# Patient Record
Sex: Male | Born: 1965 | Race: White | Hispanic: No | Marital: Married | Smoking: Former smoker
Health system: Southern US, Community
[De-identification: ages and names within clinical notes are randomized; demographics above are authoritative.]

## PROBLEM LIST (undated history)

## (undated) DIAGNOSIS — M545 Low back pain: Secondary | ICD-10-CM

## (undated) DIAGNOSIS — K219 Gastro-esophageal reflux disease without esophagitis: Secondary | ICD-10-CM

## (undated) DIAGNOSIS — F329 Major depressive disorder, single episode, unspecified: Secondary | ICD-10-CM

## (undated) DIAGNOSIS — E119 Type 2 diabetes mellitus without complications: Secondary | ICD-10-CM

## (undated) DIAGNOSIS — E785 Hyperlipidemia, unspecified: Secondary | ICD-10-CM

## (undated) DIAGNOSIS — I10 Essential (primary) hypertension: Secondary | ICD-10-CM

## (undated) DIAGNOSIS — F32A Depression, unspecified: Secondary | ICD-10-CM

## (undated) HISTORY — DX: Low back pain: M54.5

## (undated) HISTORY — PX: TONSILLECTOMY: SUR1361

## (undated) HISTORY — DX: Hyperlipidemia, unspecified: E78.5

## (undated) HISTORY — PX: WISDOM TOOTH EXTRACTION: SHX21

## (undated) HISTORY — PX: OTHER SURGICAL HISTORY: SHX169

---

## 2002-06-09 DIAGNOSIS — M545 Low back pain, unspecified: Secondary | ICD-10-CM

## 2002-06-09 HISTORY — DX: Low back pain, unspecified: M54.50

## 2005-11-05 ENCOUNTER — Emergency Department: Payer: Self-pay | Admitting: Emergency Medicine

## 2006-01-05 ENCOUNTER — Emergency Department: Payer: Self-pay | Admitting: Emergency Medicine

## 2009-04-30 ENCOUNTER — Ambulatory Visit: Payer: Self-pay | Admitting: Internal Medicine

## 2011-09-08 ENCOUNTER — Emergency Department: Payer: Self-pay | Admitting: Emergency Medicine

## 2011-09-08 LAB — BASIC METABOLIC PANEL
Anion Gap: 11 (ref 7–16)
BUN: 15 mg/dL (ref 7–18)
Calcium, Total: 8.9 mg/dL (ref 8.5–10.1)
Chloride: 102 mmol/L (ref 98–107)
Co2: 25 mmol/L (ref 21–32)
Creatinine: 0.91 mg/dL (ref 0.60–1.30)
EGFR (African American): >60
EGFR (Non-African Amer.): >60
Glucose: 157 mg/dL — ABNORMAL HIGH (ref 65–99)
Osmolality: 280 (ref 275–301)
Potassium: 3.8 mmol/L (ref 3.5–5.1)
Sodium: 138 mmol/L (ref 136–145)

## 2011-09-08 LAB — CBC
HCT: 47.1 % (ref 40.0–52.0)
HGB: 16 g/dL (ref 13.0–18.0)
MCH: 31.1 pg (ref 26.0–34.0)
MCHC: 33.9 g/dL (ref 32.0–36.0)
MCV: 92 fL (ref 80–100)
Platelet: 193 10*3/uL (ref 150–440)
RBC: 5.13 10*6/uL (ref 4.40–5.90)
RDW: 11.4 % — ABNORMAL LOW (ref 11.5–14.5)
WBC: 7.9 10*3/uL (ref 3.8–10.6)

## 2011-09-08 LAB — TROPONIN I: Troponin-I: <0.02 ng/mL

## 2012-08-02 ENCOUNTER — Emergency Department: Payer: Self-pay | Admitting: Emergency Medicine

## 2013-12-26 ENCOUNTER — Ambulatory Visit: Payer: Self-pay

## 2014-11-21 ENCOUNTER — Other Ambulatory Visit: Payer: Self-pay

## 2014-11-21 DIAGNOSIS — K219 Gastro-esophageal reflux disease without esophagitis: Secondary | ICD-10-CM

## 2014-11-21 LAB — CBC
Hematocrit: 43.6 % (ref 37.5–51.0)
Hemoglobin: 15.8 g/dL (ref 12.6–17.7)
MCH: 32.4 pg (ref 26.6–33.0)
MCHC: 36.2 g/dL — ABNORMAL HIGH (ref 31.5–35.7)
MCV: 90 fL (ref 79–97)
Platelets: 198 10*3/uL (ref 150–379)
RBC: 4.87 x10E6/uL (ref 4.14–5.80)
RDW: 12 % — ABNORMAL LOW (ref 12.3–15.4)
WBC: 7 10*3/uL (ref 3.4–10.8)

## 2014-11-22 ENCOUNTER — Telehealth: Payer: Self-pay | Admitting: Family Medicine

## 2014-11-22 LAB — COMPREHENSIVE METABOLIC PANEL
ALT: 76 IU/L — ABNORMAL HIGH (ref 0–44)
AST: 55 IU/L — ABNORMAL HIGH (ref 0–40)
Albumin/Globulin Ratio: 1.5 (ref 1.1–2.5)
Albumin: 4.3 g/dL (ref 3.5–5.5)
Alkaline Phosphatase: 90 IU/L (ref 39–117)
BUN/Creatinine Ratio: 11 (ref 9–20)
BUN: 12 mg/dL (ref 6–24)
Bilirubin Total: 1.1 mg/dL (ref 0.0–1.2)
CO2: 23 mmol/L (ref 18–29)
Calcium: 9.7 mg/dL (ref 8.7–10.2)
Chloride: 100 mmol/L (ref 97–108)
Creatinine, Ser: 1.11 mg/dL (ref 0.76–1.27)
GFR calc Af Amer: 90 mL/min/{1.73_m2} (ref >59)
GFR calc non Af Amer: 78 mL/min/{1.73_m2} (ref >59)
Globulin, Total: 2.8 g/dL (ref 1.5–4.5)
Glucose: 166 mg/dL — ABNORMAL HIGH (ref 65–99)
Potassium: 4.1 mmol/L (ref 3.5–5.2)
Sodium: 140 mmol/L (ref 134–144)
Total Protein: 7.1 g/dL (ref 6.0–8.5)

## 2014-11-22 NOTE — Progress Notes (Signed)
LMTCB/ JH  

## 2014-11-22 NOTE — Telephone Encounter (Signed)
Called pt to review labs. No answer.  If he calls while I am out of the office: CMP: Glucose elevated- he will need a HgA1c at next visit. Liver enzymes are also elevated. Please review any medication use such as tylenol that can elevate liver enzymes. Also triage how much alcohol the patient is consuming. Caution against overuse of tylenol and limit alcohol intake. He will need these enzymes checked in about 1 month. He's due for a follow-up for his anxiety soon.   Thanks! AK

## 2014-11-22 NOTE — Telephone Encounter (Signed)
Left Message with male to have him call us.[facility]

## 2014-11-22 NOTE — Progress Notes (Signed)
Quick Note:  Glucose is elevated. Likely not fasting. We will check him for diabetes with an A1c at his next visit. Liver enzymes are elevated- review alcohol intake and tylenol use with patient. Recheck. ______

## 2014-11-23 ENCOUNTER — Telehealth: Payer: Self-pay | Admitting: Family Medicine

## 2014-11-23 NOTE — Telephone Encounter (Signed)
Have left several messages. Will send letter and await call from patient.[facility]

## 2014-11-23 NOTE — Telephone Encounter (Signed)
Spoke to pt and advised him to reduce alcohol intake and no tylenol and needed an appt within month for follow up reg lab work and anxiety he will call us to schedule an appt.

## 2014-11-23 NOTE — Telephone Encounter (Signed)
Pt. Return your  Call from yesterday. Pt call back # is [phone]. thanks

## 2015-01-15 ENCOUNTER — Ambulatory Visit (INDEPENDENT_AMBULATORY_CARE_PROVIDER_SITE_OTHER): Payer: No Typology Code available for payment source | Admitting: Family Medicine

## 2015-01-15 ENCOUNTER — Encounter: Payer: Self-pay | Admitting: Family Medicine

## 2015-01-15 VITALS — BP 154/96 | HR 70 | Temp 97.8°F | Resp 16 | Ht 69.0 in | Wt 261.0 lb

## 2015-01-15 DIAGNOSIS — S338XXA Sprain of other parts of lumbar spine and pelvis, initial encounter: Secondary | ICD-10-CM

## 2015-01-15 DIAGNOSIS — M549 Dorsalgia, unspecified: Secondary | ICD-10-CM | POA: Diagnosis not present

## 2015-01-15 DIAGNOSIS — K219 Gastro-esophageal reflux disease without esophagitis: Secondary | ICD-10-CM | POA: Insufficient documentation

## 2015-01-15 DIAGNOSIS — R7309 Other abnormal glucose: Secondary | ICD-10-CM | POA: Diagnosis not present

## 2015-01-15 DIAGNOSIS — R7401 Elevation of levels of liver transaminase levels: Secondary | ICD-10-CM

## 2015-01-15 DIAGNOSIS — S39012A Strain of muscle, fascia and tendon of lower back, initial encounter: Secondary | ICD-10-CM

## 2015-01-15 DIAGNOSIS — G8929 Other chronic pain: Secondary | ICD-10-CM | POA: Diagnosis not present

## 2015-01-15 DIAGNOSIS — G4700 Insomnia, unspecified: Secondary | ICD-10-CM | POA: Insufficient documentation

## 2015-01-15 DIAGNOSIS — IMO0001 Reserved for inherently not codable concepts without codable children: Secondary | ICD-10-CM

## 2015-01-15 DIAGNOSIS — R740 Nonspecific elevation of levels of transaminase and lactic acid dehydrogenase [LDH]: Secondary | ICD-10-CM | POA: Diagnosis not present

## 2015-01-15 DIAGNOSIS — R030 Elevated blood-pressure reading, without diagnosis of hypertension: Secondary | ICD-10-CM

## 2015-01-15 DIAGNOSIS — E669 Obesity, unspecified: Secondary | ICD-10-CM | POA: Insufficient documentation

## 2015-01-15 MED ORDER — MELOXICAM 15 MG PO TABS: 15.0000 mg | ORAL_TABLET | Freq: Every day | ORAL | Status: DC

## 2015-01-15 MED ORDER — KETOROLAC TROMETHAMINE 30 MG/ML IJ SOLN
15.0000 mg | Freq: Once | INTRAMUSCULAR | Status: AC
  Administered 2015-01-15: 15 mg via INTRAMUSCULAR

## 2015-01-15 MED ORDER — METHOCARBAMOL 500 MG PO TABS: 1000.0000 mg | ORAL_TABLET | Freq: Four times a day (QID) | ORAL | Status: DC

## 2015-01-15 NOTE — Progress Notes (Signed)
Subjective:    Patient ID: Dean Clements, male    DOB: [birth year], 49 y.o.   MRN: [id number]  HPI: Drago Hammonds is a 49 y.o. male presenting on 01/15/2015 for Back Pain   Back Pain This is a chronic problem. The current episode started more than 1 year ago. The problem has been waxing and waning since onset. The pain is present in the lumbar spine. The quality of the pain is described as stabbing. The pain does not radiate. The pain is at a severity of 10/10. The pain is the same all the time. The symptoms are aggravated by twisting. Pertinent negatives include no bladder incontinence, bowel incontinence, chest pain, dysuria, fever, headaches, leg pain, numbness, paresthesias, pelvic pain, perianal numbness or weakness. Risk factors include obesity. He has tried ice and NSAIDs for the symptoms. The treatment provided mild relief.    Pt presents for low back pain. Symptoms back on last Thursday. Just switched jobs, does a lot more moving/bending at work. This is a frequent complaint due to old back injury. Has a back flare about once per year.   Pt also had questions about recent lab work:  Transaminases are elevated- recheck hepatic function. Reports he was drinking more when labs were done.  Pt reports reducing his alcohol intake.  Elevated glucose: We will check HgA1c to screen for diabetes.  BP is elevated today. Pt has no history of elevated BP. Pt reports he is in a lot of pain.    Past Medical History  Diagnosis Date  . Hyperlipidemia   . Low back pain 2004    occasional back flares each year.     No current outpatient prescriptions on file prior to visit.   No current facility-administered medications on file prior to visit.    Review of Systems  Constitutional: Negative for fever and chills.  HENT: Negative.   Respiratory: Negative for chest tightness, shortness of breath and wheezing.   Cardiovascular: Negative for chest pain, palpitations and leg  swelling.  Gastrointestinal: Negative.  Negative for bowel incontinence.  Genitourinary: Negative for bladder incontinence, dysuria, urgency, difficulty urinating and pelvic pain.  Musculoskeletal: Positive for back pain.  Neurological: Negative for weakness, numbness, headaches and paresthesias.   Per HPI unless specifically indicated above     Objective:    BP 154/96 mmHg  Pulse 70  Temp(Src) 97.8 F (36.6 C) (Oral)  Resp 16  Ht 5\' 9"  (1.753 m)  Wt 261 lb (118.389 kg)  BMI 38.53 kg/m2  Wt Readings from Last 3 Encounters:  01/15/15 261 lb (118.389 kg)    Physical Exam  Constitutional: He is oriented to person, place, and time. He appears well-developed and well-nourished. No distress.  Cardiovascular: Normal rate and regular rhythm.  Exam reveals no gallop and no friction rub.   No murmur heard. Pulmonary/Chest: Effort normal. He has no wheezes. He has no rales.  Musculoskeletal:       Lumbar back: He exhibits pain (Focal tenderness to the R of the lumbar spine. ). He exhibits no swelling, no edema and no deformity.  Pain elicited with twisting to R. Pt able to bend to toes without pain.   Straight leg negative.   Neurological: He is alert and oriented to person, place, and time. He has normal strength and normal reflexes. No cranial nerve deficit or sensory deficit.  Skin: He is not diaphoretic.   Results for orders placed or performed in visit on 11/21/14  Comprehensive Metabolic Panel (  CMET)  Result Value Ref Range   Glucose 166 (H) 65 - 99 mg/dL   BUN 12 6 - 24 mg/dL   Creatinine, Ser 1.61 0.76 - 1.27 mg/dL   GFR calc non Af Amer 78 >59 mL/min/1.73   GFR calc Af Amer 90 >59 mL/min/1.73   BUN/Creatinine Ratio 11 9 - 20   Sodium 140 134 - 144 mmol/L   Potassium 4.1 3.5 - 5.2 mmol/L   Chloride 100 97 - 108 mmol/L   CO2 23 18 - 29 mmol/L   Calcium 9.7 8.7 - 10.2 mg/dL   Total Protein 7.1 6.0 - 8.5 g/dL   Albumin 4.3 3.5 - 5.5 g/dL   Globulin, Total 2.8 1.5 - 4.5  g/dL   Albumin/Globulin Ratio 1.5 1.1 - 2.5   Bilirubin Total 1.1 0.0 - 1.2 mg/dL   Alkaline Phosphatase 90 39 - 117 IU/L   AST 55 (H) 0 - 40 IU/L   ALT 76 (H) 0 - 44 IU/L  CBC  Result Value Ref Range   WBC 7.0 3.4 - 10.8 x10E3/uL   RBC 4.87 4.14 - 5.80 x10E6/uL   Hemoglobin 15.8 12.6 - 17.7 g/dL   Hematocrit 09.6 04.5 - 51.0 %   MCV 90 79 - 97 fL   MCH 32.4 26.6 - 33.0 pg   MCHC 36.2 (H) 31.5 - 35.7 g/dL   RDW 40.9 (L) 81.1 - 91.4 %   Platelets 198 150 - 379 x10E3/uL      Assessment & Plan:   Problem List Items Addressed This Visit      Other   Chronic back pain - Primary    Toradol injection given. Mobic at home starting tomorrow. PRN Robaxin.  Discussed alarm symptoms. Encouraged stretching, maintaining healthy weight, core strengthening exercise.       Relevant Medications   methocarbamol (ROBAXIN) 500 MG tablet   meloxicam (MOBIC) 15 MG tablet   ketorolac (TORADOL) 30 MG/ML injection 15 mg   Severe obesity (BMI 35.0-39.9)    Discussed role extra weight can play in back pain. Encouraged exercise and maintaining healthy weight.        Other Visit Diagnoses    Elevated transaminase level        Pt reports decrease ETOH intake. Recheck hepatic function panel.     Relevant Orders    Hepatic function panel    Elevated random blood glucose level        Check A1c due to random glucose >140    Relevant Orders    HgB A1c    Elevated BP        Likely due to pain. Will recheck at next visit.        Meds ordered this encounter  Medications  . pantoprazole (PROTONIX) 40 MG tablet    Sig: Take 40 mg by mouth daily.  Marland Kitchen FLUoxetine (PROZAC) 20 MG capsule    Sig: Take 40 mg by mouth daily.  . methocarbamol (ROBAXIN) 500 MG tablet    Sig: Take 2 tablets (1,000 mg total) by mouth 4 (four) times daily.    Dispense:  24 tablet    Refill:  0    Order Specific Question:  Supervising Provider    Answer:  Janeann Forehand [phone]  . meloxicam (MOBIC) 15 MG tablet    Sig:  Take 1 tablet (15 mg total) by mouth daily.    Dispense:  14 tablet    Refill:  0    Order Specific Question:  Supervising Provider    Answer:  Janeann Forehand [[id number]]  . ketorolac (TORADOL) 30 MG/ML injection 15 mg    Sig:       Follow up plan: Return if symptoms worsen or fail to improve.

## 2015-01-15 NOTE — Patient Instructions (Addendum)
Back pain: take Robaxin up to 4 times daily as needed for pain. This medication will make you sleepy- please be careful driving. Take mobic once daily- no other Ibuprofen, aleve, or advil while taking.   If you develop progressive numbness, weakness, loss of feeling in your pelvis, or loss of bowel/bladder control please seek immediate medical attention.    Maintaining an ideal body habitus, regular exercise, proper lifting techniques and mindfulness of exacerbating factors will be useful in long term management.  Instructed on use of heating pad with exercises. Consider concomitant therapy with PT, massage therapist or chiropractor. May use anti-inflammatory medication and muscle relaxer as needed.   Back Pain, Adult Low back pain is very common. About 1 in 5 people have back pain.The cause of low back pain is rarely dangerous. The pain often gets better over time.About half of people with a sudden onset of back pain feel better in just 2 weeks. About 8 in 10 people feel better by 6 weeks.  CAUSES Some common causes of back pain include:  Strain of the muscles or ligaments supporting the spine.  Wear and tear (degeneration) of the spinal discs.  Arthritis.  Direct injury to the back. DIAGNOSIS Most of the time, the direct cause of low back pain is not known.However, back pain can be treated effectively even when the exact cause of the pain is unknown.Answering your caregiver's questions about your overall health and symptoms is one of the most accurate ways to make sure the cause of your pain is not dangerous. If your caregiver needs more information, he or she may order lab work or imaging tests (X-rays or MRIs).However, even if imaging tests show changes in your back, this usually does not require surgery. HOME CARE INSTRUCTIONS For many people, back pain returns.Since low back pain is rarely dangerous, it is often a condition that people can learn to [facility] their own.   Remain  active. It is stressful on the back to sit or stand in one place. Do not sit, drive, or stand in one place for more than 30 minutes at a time. Take short walks on level surfaces as soon as pain allows.Try to increase the length of time you walk each day.  Do not stay in bed.Resting more than 1 or 2 days can delay your recovery.  Do not avoid exercise or work.Your body is made to move.It is not dangerous to be active, even though your back may hurt.Your back will likely heal faster if you return to being active before your pain is gone.  Pay attention to your body when you bend and lift. Many people have less discomfortwhen lifting if they bend their knees, keep the load close to their bodies,and avoid twisting. Often, the most comfortable positions are those that put less stress on your recovering back.  Find a comfortable position to sleep. Use a firm mattress and lie on your side with your knees slightly bent. If you lie on your back, put a pillow under your knees.  Only take over-the-counter or prescription medicines as directed by your caregiver. Over-the-counter medicines to reduce pain and inflammation are often the most helpful.Your caregiver may prescribe muscle relaxant drugs.These medicines help dull your pain so you can more quickly return to your normal activities and healthy exercise.  Put ice on the injured area.  Put ice in a plastic bag.  Place a towel between your skin and the bag.  Leave the ice on for 15-20 minutes, 03-04 times  a day for the first 2 to 3 days. After that, ice and heat may be alternated to reduce pain and spasms.  Ask your caregiver about trying back exercises and gentle massage. This may be of some benefit.  Avoid feeling anxious or stressed.Stress increases muscle tension and can worsen back pain.It is important to recognize when you are anxious or stressed and learn ways to manage it.Exercise is a great option. SEEK MEDICAL CARE IF:  You  have pain that is not relieved with rest or medicine.  You have pain that does not improve in 1 week.  You have new symptoms.  You are generally not feeling well. SEEK IMMEDIATE MEDICAL CARE IF:   You have pain that radiates from your back into your legs.  You develop new bowel or bladder control problems.  You have unusual weakness or numbness in your arms or legs.  You develop nausea or vomiting.  You develop abdominal pain.  You feel faint. Document Released: 05/26/2005 Document Revised: 11/25/2011 Document Reviewed: 09/27/2013 [facility] Patient Information 2015 [place], [place]. This information is not intended to replace advice given to you by your health care provider. Make sure you discuss any questions you have with your health care provider.

## 2015-01-15 NOTE — Assessment & Plan Note (Signed)
Toradol injection given. Mobic at home starting tomorrow. PRN Robaxin.  Discussed alarm symptoms. Encouraged stretching, maintaining healthy weight, core strengthening exercise.

## 2015-01-15 NOTE — Assessment & Plan Note (Signed)
Discussed role extra weight can play in back pain. Encouraged exercise and maintaining healthy weight.

## 2015-01-30 ENCOUNTER — Telehealth: Payer: Self-pay | Admitting: *Deleted

## 2015-01-30 ENCOUNTER — Telehealth: Payer: Self-pay

## 2015-01-30 DIAGNOSIS — S39012A Strain of muscle, fascia and tendon of lower back, initial encounter: Secondary | ICD-10-CM

## 2015-01-30 MED ORDER — METHOCARBAMOL 500 MG PO TABS: 1000.0000 mg | ORAL_TABLET | Freq: Four times a day (QID) | ORAL | Status: DC

## 2015-01-30 NOTE — Telephone Encounter (Signed)
RX SENT PREVIOUSLY TO [facility].

## 2015-01-30 NOTE — Telephone Encounter (Signed)
I have sent him a refill to use PRN for his back pain. [facility] is the pharmacy I had on file.

## 2015-01-30 NOTE — Telephone Encounter (Signed)
Patient called requesting a refill on Methocarbamol .  Patient reported that medication helped him but he is out now.  Please advise

## 2015-03-28 ENCOUNTER — Other Ambulatory Visit: Payer: Self-pay | Admitting: Family Medicine

## 2015-03-28 MED ORDER — PANTOPRAZOLE SODIUM 40 MG PO TBEC: 40.0000 mg | DELAYED_RELEASE_TABLET | Freq: Every day | ORAL | Status: DC

## 2015-03-28 MED ORDER — FLUOXETINE HCL 20 MG PO CAPS: 40.0000 mg | ORAL_CAPSULE | Freq: Every day | ORAL | Status: DC

## 2015-04-03 ENCOUNTER — Encounter: Payer: Self-pay | Admitting: Family Medicine

## 2015-04-03 ENCOUNTER — Other Ambulatory Visit: Payer: Self-pay | Admitting: *Deleted

## 2015-04-03 ENCOUNTER — Ambulatory Visit (INDEPENDENT_AMBULATORY_CARE_PROVIDER_SITE_OTHER): Payer: No Typology Code available for payment source | Admitting: Family Medicine

## 2015-04-03 VITALS — BP 160/90 | HR 83 | Resp 16 | Ht 69.0 in | Wt 264.0 lb

## 2015-04-03 DIAGNOSIS — K219 Gastro-esophageal reflux disease without esophagitis: Secondary | ICD-10-CM | POA: Diagnosis not present

## 2015-04-03 DIAGNOSIS — I10 Essential (primary) hypertension: Secondary | ICD-10-CM

## 2015-04-03 DIAGNOSIS — G4700 Insomnia, unspecified: Secondary | ICD-10-CM

## 2015-04-03 DIAGNOSIS — Z23 Encounter for immunization: Secondary | ICD-10-CM

## 2015-04-03 DIAGNOSIS — F419 Anxiety disorder, unspecified: Secondary | ICD-10-CM

## 2015-04-03 DIAGNOSIS — E669 Obesity, unspecified: Secondary | ICD-10-CM

## 2015-04-03 MED ORDER — LISINOPRIL 10 MG PO TABS: 10.0000 mg | ORAL_TABLET | Freq: Every day | ORAL | Status: DC

## 2015-04-03 NOTE — Addendum Note (Signed)
Addended by: Alease FrameARTER, Harmony Sandell S on: 04/03/2015 03:27 PM   Modules accepted: Orders

## 2015-04-03 NOTE — Progress Notes (Signed)
Name: Dean Clements   MRN: [id number]    DOB: 1965/06/28   Date:04/03/2015       Progress Note  Subjective  Chief Complaint  Chief Complaint  Patient presents with  . Hypertension    elevated BP - No symptoms   160/?     HPI Hx. Of HBP in past with some random comments about HBP at other visits.  BP by health nurse elevated to 160 sys range.    Also c/o chronic insomnia.  Cannot get several hrs of straight sleep.  Constantly wakens.  Tired during the day.   Some mouth breathing /snorting.  No problem-specific assessment & plan notes found for this encounter.   Past Medical History  Diagnosis Date  . Hyperlipidemia   . Low back pain 2004    occasional back flares each year.     Social History  Substance Use Topics  . Smoking status: Current Every Day Smoker -- 0.20 packs/day for 3 years    Types: Cigarettes  . Smokeless tobacco: Never Used  . Alcohol Use: 0.0 oz/week    0 Standard drinks or equivalent per week     Current outpatient prescriptions:  .  FLUoxetine (PROZAC) 20 MG capsule, Take 2 capsules (40 mg total) by mouth daily. Please call office to schedule appointment., Disp: 60 capsule, Rfl: 0 .  methocarbamol (ROBAXIN) 500 MG tablet, Take 2 tablets (1,000 mg total) by mouth 4 (four) times daily., Disp: 24 tablet, Rfl: 0 .  pantoprazole (PROTONIX) 40 MG tablet, Take 1 tablet (40 mg total) by mouth daily., Disp: 30 tablet, Rfl: 0  No Known Allergies  Review of Systems  Constitutional: Positive for malaise/fatigue. Negative for fever, chills and weight loss.  HENT: Negative for hearing loss.   Eyes: Negative for blurred vision and double vision.  Respiratory: Negative for cough, sputum production, shortness of breath and wheezing.   Cardiovascular: Negative for chest pain, palpitations, orthopnea and leg swelling.  Gastrointestinal: Negative for heartburn, abdominal pain and blood in stool.  Genitourinary: Negative for dysuria, urgency and frequency.   Musculoskeletal: Negative for back pain.  Skin: Negative for rash.  Neurological: Negative for dizziness, tremors, weakness and headaches.  Psychiatric/Behavioral: The patient has insomnia.       Objective  Filed Vitals:   04/03/15 1435  BP: 152/91  Pulse: 83  Resp: 16  Height:  (1.753 m)  Weight: 264 lb (119.75 kg)     Physical Exam  Constitutional: He is oriented to person, place, and time and well-developed, well-nourished, and in no distress. No distress.  HENT:  Head: Normocephalic and atraumatic.  Eyes: Conjunctivae and EOM are normal. Pupils are equal, round, and reactive to light. No scleral icterus.  Neck: Normal range of motion. Neck supple. No thyromegaly present.  Cardiovascular: Normal rate, regular rhythm, normal heart sounds and intact distal pulses.  Exam reveals no gallop and no friction rub.   No murmur heard. Pulmonary/Chest: Effort normal and breath sounds normal. No respiratory distress. He has no wheezes. He has no rales.  Abdominal: Soft. Bowel sounds are normal. He exhibits no distension and no mass. There is no tenderness.  Musculoskeletal: Normal range of motion. He exhibits no edema.  Lymphadenopathy:    He has no cervical adenopathy.  Neurological: He is alert and oriented to person, place, and time.  Vitals reviewed.     No results found for this or any previous visit (from the past 2160 hour(s)).   Assessment & Plan  1. Essential hypertension  - Comprehensive Metabolic Panel (CMET) - lisinopril (PRINIVIL,ZESTRIL) 10 MG tablet; Take 1 tablet (10 mg total) by mouth daily.  Dispense: 30 tablet; Refill: 6  2. Insomnia  - Ambulatory referral to Sleep Studies  3. Gastroesophageal reflux disease without esophagitis  - CBC with Differential  4. Anxiety   5. Obesity  - Lipid Profile - HgB A1c  6. Need for influenza vaccination  - Flu Vaccine QUAD 36+ mos PF IM (Fluarix & Fluzone Quad PF)

## 2015-04-03 NOTE — Patient Instructions (Addendum)
Try to reduce calories to lose weight.  Consider changing from Prozac to Zoloft later.

## 2015-05-08 ENCOUNTER — Ambulatory Visit: Payer: No Typology Code available for payment source | Admitting: Family Medicine

## 2015-05-21 ENCOUNTER — Other Ambulatory Visit: Payer: Self-pay | Admitting: Family Medicine

## 2015-07-31 ENCOUNTER — Other Ambulatory Visit: Payer: Self-pay | Admitting: Family Medicine

## 2015-08-02 NOTE — Telephone Encounter (Signed)
Please review

## 2015-08-27 ENCOUNTER — Encounter: Payer: Self-pay | Admitting: Emergency Medicine

## 2015-08-27 ENCOUNTER — Ambulatory Visit
Admission: EM | Admit: 2015-08-27 | Discharge: 2015-08-27 | Disposition: A | Discharge status: Home / Self Care | Payer: BLUE CROSS/BLUE SHIELD | Attending: Emergency Medicine | Admitting: Emergency Medicine

## 2015-08-27 DIAGNOSIS — R7301 Impaired fasting glucose: Secondary | ICD-10-CM | POA: Diagnosis not present

## 2015-08-27 DIAGNOSIS — Z131 Encounter for screening for diabetes mellitus: Secondary | ICD-10-CM

## 2015-08-27 LAB — BASIC METABOLIC PANEL
Anion gap: 7 (ref 5–15)
BUN: 11 mg/dL (ref 6–20)
CO2: 24 mmol/L (ref 22–32)
Calcium: 9.2 mg/dL (ref 8.9–10.3)
Chloride: 101 mmol/L (ref 101–111)
Creatinine, Ser: 0.75 mg/dL (ref 0.61–1.24)
GFR calc Af Amer: >60 mL/min (ref >60)
GFR calc non Af Amer: >60 mL/min (ref >60)
Glucose, Bld: 157 mg/dL — ABNORMAL HIGH (ref 65–99)
Potassium: 3.8 mmol/L (ref 3.5–5.1)
Sodium: 132 mmol/L — ABNORMAL LOW (ref 135–145)

## 2015-08-27 LAB — GLUCOSE, CAPILLARY: Glucose-Capillary: 162 mg/dL — ABNORMAL HIGH (ref 65–99)

## 2015-08-27 NOTE — Discharge Instructions (Signed)
You have a slightly elevated glucose today, but this is not diagnostic of diabetes. We will need to get the hemoglobin A1c back to make a definitive diagnosis and to risk stratify you. This can be done by her primary care physician. I will notify Amy Krebs that you were here today and of your lab results. You need to follow up with them for further testing and ongoing management.

## 2015-08-27 NOTE — ED Provider Notes (Signed)
HPI  SUBJECTIVE:  Dean Clements is a 50 y.o. male who presents with night sweats, fatigue, polydipsia, polyuria for the past 6 months. There are no aggravating or alleviating factors. He has not tried anything for this. He denies coughing, wheezing, hemoptysis, chest pain, shortness of breath. No fevers, abdominal pain. No unintentional weight loss. No travel, known exposure to TB. No history of blood transfusions, IV drug use, sex with other men. He states that he does not sleep bundled or under heavy covers. He has been evaluated by his PMD for possible diabetes and was directed to go to [facility] for complete workup. Patient states that he lost the paperwork and has presented here for workup. Past medical history of hypertension, patient states he is compliant with his meds, but did not take it today, depression. Also per chart review hyperlipidemia. No history of MI, cancer. Family history has a for diabetes. He is a Music therapist. PMD Amy Krebs at [facility].  Patient is fasting at this time.   Past Medical History  Diagnosis Date  . Hyperlipidemia   . Low back pain 2004    occasional back flares each year.     Past Surgical History  Procedure Laterality Date  . None      Family History  Problem Relation Age of Onset  . Cancer Mother   . Heart disease Father   . Diabetes Father   . Hypertension Father     Social History  Substance Use Topics  . Smoking status: Former Smoker -- 0.20 packs/day for 3 years    Types: Cigarettes  . Smokeless tobacco: Never Used  . Alcohol Use: 0.0 oz/week    0 Standard drinks or equivalent per week    No current facility-administered medications for this encounter.  Current outpatient prescriptions:  .  FLUoxetine (PROZAC) 20 MG capsule, TAKE 2 CAPSULES ( ) BY MOUTH ONCE DAILY, Disp: 60 capsule, Rfl: 3 .  lisinopril (PRINIVIL,ZESTRIL) 10 MG tablet, Take 1 tablet (10 mg total) by mouth daily., Disp: 30 tablet, Rfl: 6 .   methocarbamol (ROBAXIN) 500 MG tablet, Take 2 tablets (1,000 mg total) by mouth 4 (four) times daily., Disp: 24 tablet, Rfl: 0 .  pantoprazole (PROTONIX) 40 MG tablet, TAKE 1 TABLET BY MOUTH ONCE DAILY., Disp: 30 tablet, Rfl: 3  No Known Allergies   ROS  As noted in HPI.   Physical Exam  BP 163/103 mmHg  Pulse 80  Temp(Src) 97.8 F (36.6 C)  Resp 18  Ht  (1.803 m)  Wt 263 lb (119.296 kg)  BMI 36.70 kg/m2  SpO2 96%   BP Readings from Last 3 Encounters:  08/27/15 163/103  04/03/15 160/90  01/15/15 154/96   Constitutional: Well developed, well nourished, no acute distress Eyes: PERRL, EOMI, conjunctiva normal bilaterally HENT: Normocephalic, atraumatic,mucus membranes moist Respiratory: Clear to auscultation bilaterally, no rales, no wheezing, no rhonchi Cardiovascular: Normal rate and rhythm, no murmurs, no gallops, no rubs GI: Soft, nondistended, normal bowel sounds, nontender, no rebound, no guarding Back: no CVAT skin: No rash, skin intact Musculoskeletal: No edema, no tenderness, no deformities Neurologic: Alert & oriented x 3, CN II-XII grossly intact, no motor deficits, sensation grossly intact Psychiatric: Speech and behavior appropriate   ED Course   Medications - No data to display  Orders Placed This Encounter  Procedures  . Hemoglobin A1c    Standing Status: Standing     Number of Occurrences: 1     Standing Expiration Date:   .  Basic metabolic panel    Standing Status: Standing     Number of Occurrences: 1     Standing Expiration Date:    Results for orders placed or performed during the hospital encounter of 08/27/15 (from the past 24 hour(s))  Basic metabolic panel     Status: Abnormal   Collection Time: 08/27/15  2:21 PM  Result Value Ref Range   Sodium 132 (L) 135 - 145 mmol/L   Potassium 3.8 3.5 - 5.1 mmol/L   Chloride 101 101 - 111 mmol/L   CO2 24 22 - 32 mmol/L   Glucose, Bld 157 (H) 65 - 99 mg/dL   BUN 11 6 - 20 mg/dL    Creatinine, Ser 1.610.75 0.61 - 1.24 mg/dL   Calcium 9.2 8.9 - 09.610.3 mg/dL   GFR calc non Af Amer >60 >60 mL/min   GFR calc Af Amer >60 >60 mL/min   Anion gap 7 5 - 15   No results found.  ED Clinical Impression  Fasting hyperglycemia  Screening for diabetes mellitus   ED Assessment/Plan  Blood pressure noted. This is near his baseline. He is asymptomatic today. He did not take his medications today. Advised him that he'll need to take his medications on a regular basis.   these are fasting labs. Sodium is slightly low, most likely from excess water intake. His glucose is 157. This will need to be repeated to confirm a diagnosis of diabetes. A1c is pending.  We will notify PMD at today's visit and of labs. Advised patient that he will need to follow up with his primary care physician for definitive diagnosis and ongoing management.  Discussed labs, MDM, plan and followup with patient. Patient agrees with plan.   *This clinic note was created using Dragon dictation software. Therefore, there may be occasional mistakes despite careful proofreading.  ?   Domenick GongAshley Jakaya Jacobowitz, MD 08/27/15 1500

## 2015-08-27 NOTE — ED Notes (Signed)
Patient states he is afraid he has become diabetic. States he has been having night sweats and excessive tiredness for about 6 months

## 2015-08-28 LAB — HEMOGLOBIN A1C: Hgb A1c MFr Bld: 6.5 % — ABNORMAL HIGH (ref 4.0–6.0)

## 2015-09-10 ENCOUNTER — Encounter: Payer: Self-pay | Admitting: Family Medicine

## 2015-09-10 ENCOUNTER — Ambulatory Visit (INDEPENDENT_AMBULATORY_CARE_PROVIDER_SITE_OTHER): Payer: BLUE CROSS/BLUE SHIELD | Admitting: Family Medicine

## 2015-09-10 VITALS — BP 150/88 | HR 71 | Temp 98.1°F | Resp 16 | Ht 69.0 in | Wt 260.0 lb

## 2015-09-10 DIAGNOSIS — E1169 Type 2 diabetes mellitus with other specified complication: Secondary | ICD-10-CM | POA: Insufficient documentation

## 2015-09-10 DIAGNOSIS — E119 Type 2 diabetes mellitus without complications: Secondary | ICD-10-CM | POA: Diagnosis not present

## 2015-09-10 DIAGNOSIS — S338XXA Sprain of other parts of lumbar spine and pelvis, initial encounter: Secondary | ICD-10-CM | POA: Diagnosis not present

## 2015-09-10 DIAGNOSIS — I10 Essential (primary) hypertension: Secondary | ICD-10-CM

## 2015-09-10 DIAGNOSIS — S39012A Strain of muscle, fascia and tendon of lower back, initial encounter: Secondary | ICD-10-CM

## 2015-09-10 DIAGNOSIS — E1140 Type 2 diabetes mellitus with diabetic neuropathy, unspecified: Secondary | ICD-10-CM | POA: Insufficient documentation

## 2015-09-10 MED ORDER — KETOROLAC TROMETHAMINE 30 MG/ML IJ SOLN: 30.0000 mg | Freq: Once | INTRAMUSCULAR | Status: DC

## 2015-09-10 MED ORDER — METHOCARBAMOL 500 MG PO TABS: 1000.0000 mg | ORAL_TABLET | Freq: Four times a day (QID) | ORAL | Status: DC

## 2015-09-10 MED ORDER — KETOROLAC TROMETHAMINE 60 MG/2ML IM SOLN
60.0000 mg | Freq: Once | INTRAMUSCULAR | Status: AC
  Administered 2015-09-10: 60 mg via INTRAMUSCULAR

## 2015-09-10 MED ORDER — METFORMIN HCL 500 MG PO TABS: 500.0000 mg | ORAL_TABLET | Freq: Two times a day (BID) | ORAL | Status: DC

## 2015-09-10 NOTE — Assessment & Plan Note (Signed)
Diagnose as DM due to FPG >126 with A1c of 6.5%. Start metformin. Refer to DM education. Refer for DM eye exam. Foot exam done. Pt is on an ACE.  Return one month for full diabetes education during visit- since today was sick visit. Recheck A1c due 11/28/15.

## 2015-09-10 NOTE — Assessment & Plan Note (Signed)
Elevated today. But pt did not take medication. Encouraged pt to take medication daily. Recheck 1 mos.

## 2015-09-10 NOTE — Patient Instructions (Addendum)
Exercise: Goal- walk 10 minutes after each meal. Goal is 150 minutes per week.   Diabetes Mellitus and Food It is important for you to manage your blood sugar (glucose) level. Your blood glucose level can be greatly affected by what you eat. Eating healthier foods in the appropriate amounts throughout the day at about the same time each day will help you control your blood glucose level. It can also help slow or prevent worsening of your diabetes mellitus. Healthy eating may even help you improve the level of your blood pressure and reach or maintain a healthy weight.  General recommendations for healthful eating and cooking habits include:  Eating meals and snacks regularly. Avoid going long periods of time without eating to lose weight.  Eating a diet that consists mainly of plant-based foods, such as fruits, vegetables, nuts, legumes, and whole grains.  Using low-heat cooking methods, such as baking, instead of high-heat cooking methods, such as deep frying. Work with your dietitian to make sure you understand how to use the Nutrition Facts information on food labels. HOW CAN FOOD AFFECT ME? Carbohydrates Carbohydrates affect your blood glucose level more than any other type of food. Your dietitian will help you determine how many carbohydrates to eat at each meal and teach you how to count carbohydrates. Counting carbohydrates is important to keep your blood glucose at a healthy level, especially if you are using insulin or taking certain medicines for diabetes mellitus. Alcohol Alcohol can cause sudden decreases in blood glucose (hypoglycemia), especially if you use insulin or take certain medicines for diabetes mellitus. Hypoglycemia can be a life-threatening condition. Symptoms of hypoglycemia (sleepiness, dizziness, and disorientation) are similar to symptoms of having too much alcohol.  If your health care provider has given you approval to drink alcohol, do so in moderation and use the  following guidelines:  Women should not have more than one drink per day, and men should not have more than two drinks per day. One drink is equal to:  12 oz of beer.  5 oz of wine.  1 oz of hard liquor.  Do not drink on an empty stomach.  Keep yourself hydrated. Have water, diet soda, or unsweetened iced tea.  Regular soda, juice, and other mixers might contain a lot of carbohydrates and should be counted. WHAT FOODS ARE NOT RECOMMENDED? As you make food choices, it is important to remember that all foods are not the same. Some foods have fewer nutrients per serving than other foods, even though they might have the same number of calories or carbohydrates. It is difficult to get your body what it needs when you eat foods with fewer nutrients. Examples of foods that you should avoid that are high in calories and carbohydrates but low in nutrients include:  Trans fats (most processed foods list trans fats on the Nutrition Facts label).  Regular soda.  Juice.  Candy.  Sweets, such as cake, pie, doughnuts, and cookies.  Fried foods. WHAT FOODS CAN I EAT? Eat nutrient-rich foods, which will nourish your body and keep you healthy. The food you should eat also will depend on several factors, including:  The calories you need.  The medicines you take.  Your weight.  Your blood glucose level.  Your blood pressure level.  Your cholesterol level. You should eat a variety of foods, including:  Protein.  Lean cuts of meat.  Proteins low in saturated fats, such as fish, egg whites, and beans. Avoid processed meats.  Fruits and vegetables.  Fruits and vegetables that may help control blood glucose levels, such as apples, mangoes, and yams.  Dairy products.  Choose fat-free or low-fat dairy products, such as milk, yogurt, and cheese.  Grains, bread, pasta, and rice.  Choose whole grain products, such as multigrain bread, whole oats, and brown rice. These foods may help  control blood pressure.  Fats.  Foods containing healthful fats, such as nuts, avocado, olive oil, canola oil, and fish. DOES EVERYONE WITH DIABETES MELLITUS HAVE THE SAME MEAL PLAN? Because every person with diabetes mellitus is different, there is not one meal plan that works for everyone. It is very important that you meet with a dietitian who will help you create a meal plan that is just right for you.   This information is not intended to replace advice given to you by your health care provider. Make sure you discuss any questions you have with your health care provider.   Document Released: 02/20/2005 Document Revised: 06/16/2014 Document Reviewed: 04/22/2013 Elsevier Interactive Patient Education [employer]

## 2015-09-10 NOTE — Progress Notes (Signed)
Subjective:    Patient ID: Dean Clements, male    DOB: 1966/03/05, 50 y.o.   MRN: [id number]  HPI: Dean Clements is a 50 y.o. male presenting on 09/10/2015 for Back Pain and Diabetes   HPI  Pt presents for back pain. Onset 5 days ago. Over did it at work. Did yard work- mulching- didn't get better. No incontinence, no saddle anesthesia. Mid low back doesn't radiate. Pain is 6/10. Sitting most comfortable. Lying down very uncomfortable and lying on R.  Home treatment: Heat and ice. Mild relief.   Recent hyperglycemia at urgent care. A1c was found to be 6.5%. Fasting glucose was 158. Previous fasting glucose was 140. Pt is reporting some occasional numbness in feet. Does have family of diabetes.  BP is up- has not taken lisinopril this morning.   Past Medical History  Diagnosis Date  . Hyperlipidemia   . Low back pain 2004    occasional back flares each year.     Current Outpatient Prescriptions on File Prior to Visit  Medication Sig  . FLUoxetine (PROZAC) 20 MG capsule TAKE 2 CAPSULES ( ) BY MOUTH ONCE DAILY  . lisinopril (PRINIVIL,ZESTRIL) 10 MG tablet Take 1 tablet (10 mg total) by mouth daily.  . pantoprazole (PROTONIX) 40 MG tablet TAKE 1 TABLET BY MOUTH ONCE DAILY.   No current facility-administered medications on file prior to visit.    Review of Systems  Constitutional: Negative for fever and chills.  HENT: Negative.   Respiratory: Negative for chest tightness, shortness of breath and wheezing.   Cardiovascular: Negative for chest pain, palpitations and leg swelling.  Gastrointestinal: Negative for nausea, vomiting and abdominal pain.  Endocrine: Positive for polydipsia, polyphagia and polyuria.  Genitourinary: Negative for dysuria, urgency, discharge, penile pain and testicular pain.  Musculoskeletal: Positive for back pain. Negative for joint swelling, arthralgias, neck pain and neck stiffness.  Skin: Negative.   Neurological: Positive for  numbness (toes, occasional.). Negative for dizziness, weakness and headaches.  Psychiatric/Behavioral: Negative for sleep disturbance and dysphoric mood.   Per HPI unless specifically indicated above     Objective:    BP 150/88 mmHg  Pulse 71  Temp(Src) 98.1 F (36.7 C) (Oral)  Resp 16  Ht  (1.753 m)  Wt 260 lb (117.935 kg)  BMI 38.38 kg/m2  Wt Readings from Last 3 Encounters:  09/10/15 260 lb (117.935 kg)  08/27/15 263 lb (119.296 kg)  04/03/15 264 lb (119.75 kg)    Physical Exam  Constitutional: He is oriented to person, place, and time. He appears well-developed and well-nourished. No distress.  HENT:  Head: Normocephalic and atraumatic.  Neck: Neck supple. No thyromegaly present.  Cardiovascular: Normal rate, regular rhythm and normal heart sounds.  Exam reveals no gallop and no friction rub.   No murmur heard. Pulmonary/Chest: Effort normal and breath sounds normal. He has no wheezes.  Abdominal: Soft. Bowel sounds are normal. He exhibits no distension. There is no tenderness. There is no rebound.  Musculoskeletal: He exhibits no edema.       Lumbar back: He exhibits decreased range of motion (due to pain. unable to twist to right.) and tenderness. He exhibits no bony tenderness, no swelling and no pain.       Back:  Negative straight leg raise bilateral.  Neurological: He is alert and oriented to person, place, and time. He has normal strength and normal reflexes. No cranial nerve deficit or sensory deficit. He displays a negative Romberg sign.  Reflex  Scores:      Patellar reflexes are 2+ on the right side and 2+ on the left side. Skin: Skin is warm and dry. No rash noted. No erythema.  Psychiatric: He has a normal mood and affect. His behavior is normal. Thought content normal.   Diabetic Foot Exam - Simple   Simple Foot Form  Visual Inspection  No deformities, no ulcerations, no other skin breakdown bilaterally:  Yes  Sensation Testing  Intact to touch and  monofilament testing bilaterally:  Yes  Pulse Check  Posterior Tibialis and Dorsalis pulse intact bilaterally:  Yes  Comments      Results for orders placed or performed during the hospital encounter of 08/27/15  Hemoglobin A1c  Result Value Ref Range   Hgb A1c MFr Bld 6.5 (H) 4.0 - 6.0 %  Basic metabolic panel  Result Value Ref Range   Sodium 132 (L) 135 - 145 mmol/L   Potassium 3.8 3.5 - 5.1 mmol/L   Chloride 101 101 - 111 mmol/L   CO2 24 22 - 32 mmol/L   Glucose, Bld 157 (H) 65 - 99 mg/dL   BUN 11 6 - 20 mg/dL   Creatinine, Ser 1.610.75 0.61 - 1.24 mg/dL   Calcium 9.2 8.9 - 09.610.3 mg/dL   GFR calc non Af Amer >60 >60 mL/min   GFR calc Af Amer >60 >60 mL/min   Anion gap 7 5 - 15  Glucose, capillary  Result Value Ref Range   Glucose-Capillary 162 (H) 65 - 99 mg/dL      Assessment & Plan:   Problem List Items Addressed This Visit      Cardiovascular and Mediastinum   HBP (high blood pressure)    Elevated today. But pt did not take medication. Encouraged pt to take medication daily. Recheck 1 mos.         Endocrine   New onset type 2 diabetes mellitus (HCC)    Diagnose as DM due to FPG >126 with A1c of 6.5%. Start metformin. Refer to DM education. Refer for DM eye exam. Foot exam done. Pt is on an ACE.  Return one month for full diabetes education during visit- since today was sick visit. Recheck A1c due 11/28/15.       Relevant Medications   metFORMIN (GLUCOPHAGE) 500 MG tablet   Other Relevant Orders   Ambulatory referral to Ophthalmology   Ambulatory referral to diabetic education    Other Visit Diagnoses    Lumbosacral strain, initial encounter    -  Primary    Toradol injection for pain. PRN robaxin for muscle spasms. Encourage gentle stretching. Heat and ice for pain. Alarm symptoms reviewed. Return if not improving.    Relevant Medications    methocarbamol (ROBAXIN) 500 MG tablet    ketorolac (TORADOL) injection 60 mg (Completed)       Meds ordered this  encounter  Medications  . methocarbamol (ROBAXIN) 500 MG tablet    Sig: Take 2 tablets (1,000 mg total) by mouth 4 (four) times daily.    Dispense:  24 tablet    Refill:  1    Order Specific Question:  Supervising Provider    Answer:  Janeann ForehandHAWKINS JR, JAMES H [phone][[id number]]  . DISCONTD: ketorolac (TORADOL) 30 MG/ML injection 30 mg    Sig:   . metFORMIN (GLUCOPHAGE) 500 MG tablet    Sig: Take 1 tablet (500 mg total) by mouth 2 (two) times daily with a meal.    Dispense:  60 tablet  Refill:  11    Order Specific Question:  Supervising Provider    Answer:  Janeann Forehand [phone]  . ketorolac (TORADOL) injection 60 mg    Sig:       Follow up plan: Return in about 4 weeks (around 10/08/2015) for Diabetes. Marland Kitchen

## 2015-09-13 ENCOUNTER — Encounter: Payer: Self-pay | Admitting: *Deleted

## 2015-09-13 ENCOUNTER — Ambulatory Visit (INDEPENDENT_AMBULATORY_CARE_PROVIDER_SITE_OTHER): Payer: BLUE CROSS/BLUE SHIELD | Admitting: Family Medicine

## 2015-09-13 ENCOUNTER — Encounter: Payer: Self-pay | Admitting: Family Medicine

## 2015-09-13 VITALS — BP 156/88 | HR 80 | Temp 98.0°F | Resp 16 | Ht 69.0 in | Wt 260.0 lb

## 2015-09-13 DIAGNOSIS — M5489 Other dorsalgia: Secondary | ICD-10-CM

## 2015-09-13 DIAGNOSIS — E119 Type 2 diabetes mellitus without complications: Secondary | ICD-10-CM | POA: Diagnosis not present

## 2015-09-13 DIAGNOSIS — I10 Essential (primary) hypertension: Secondary | ICD-10-CM

## 2015-09-13 MED ORDER — NAPROXEN 500 MG PO TABS: 500.0000 mg | ORAL_TABLET | Freq: Two times a day (BID) | ORAL | Status: DC

## 2015-09-13 MED ORDER — KETOROLAC TROMETHAMINE 60 MG/2ML IM SOLN
60.0000 mg | Freq: Once | INTRAMUSCULAR | Status: AC
  Administered 2015-09-13: 60 mg via INTRAMUSCULAR

## 2015-09-13 MED ORDER — KETOROLAC TROMETHAMINE 60 MG/2ML IM SOLN: 60.0000 mg | Freq: Once | INTRAMUSCULAR | Status: DC

## 2015-09-13 NOTE — Assessment & Plan Note (Signed)
Will follow-up in 1 mos. Check CMET and Lipid in anticipation of visit.

## 2015-09-13 NOTE — Patient Instructions (Addendum)
If you develop progressive numbness, weakness, loss of feeling in your pelvis, or loss of bowel/bladder control please seek immediate medical attention.   We discussed potential pathology and long term risk of reoccurrence. Maintaining an ideal body habitus, regular exercise, proper lifting techniques and mindfulness of exacerbating factors will be useful in long term management.  Instructed on use of heating pad with exercises. Consider concomitant therapy with PT, massage therapist or chiropractor. May use anti-inflammatory medication and muscle relaxer as needed.  Back Exercises The following exercises strengthen the muscles that help to support the back. They also help to keep the lower back flexible. Doing these exercises can help to prevent back pain or lessen existing pain. If you have back pain or discomfort, try doing these exercises 2-3 times each day or as told by your health care provider. When the pain goes away, do them once each day, but increase the number of times that you repeat the steps for each exercise (do more repetitions). If you do not have back pain or discomfort, do these exercises once each day or as told by your health care provider. EXERCISES Single Knee to Chest Repeat these steps 3-5 times for each leg: 1. Lie on your back on a firm bed or the floor with your legs extended. 2. Bring one knee to your chest. Your other leg should stay extended and in contact with the floor. 3. Hold your knee in place by grabbing your knee or thigh. 4. Pull on your knee until you feel a gentle stretch in your lower back. 5. Hold the stretch for 10-30 seconds. 6. Slowly release and straighten your leg. Pelvic Tilt Repeat these steps 5-10 times: 1. Lie on your back on a firm bed or the floor with your legs extended. 2. Bend your knees so they are pointing toward the ceiling and your feet are flat on the floor. 3. Tighten your lower abdominal muscles to press your lower back against the  floor. This motion will tilt your pelvis so your tailbone points up toward the ceiling instead of pointing to your feet or the floor. 4. With gentle tension and even breathing, hold this position for 5-10 seconds. Cat-Cow Repeat these steps until your lower back becomes more flexible: 1. Get into a hands-and-knees position on a firm surface. Keep your hands under your shoulders, and keep your knees under your hips. You may place padding under your knees for comfort. 2. Let your head hang down, and point your tailbone toward the floor so your lower back becomes rounded like the back of a cat. 3. Hold this position for 5 seconds. 4. Slowly lift your head and point your tailbone up toward the ceiling so your back forms a sagging arch like the back of a cow. 5. Hold this position for 5 seconds. Press-Ups Repeat these steps 5-10 times: 1. Lie on your abdomen (face-down) on the floor. 2. Place your palms near your head, about shoulder-width apart. 3. While you keep your back as relaxed as possible and keep your hips on the floor, slowly straighten your arms to raise the top half of your body and lift your shoulders. Do not use your back muscles to raise your upper torso. You may adjust the placement of your hands to make yourself more comfortable. 4. Hold this position for 5 seconds while you keep your back relaxed. 5. Slowly return to lying flat on the floor. Bridges Repeat these steps 10 times: 1. Lie on your back on a  firm surface. 2. Bend your knees so they are pointing toward the ceiling and your feet are flat on the floor. 3. Tighten your buttocks muscles and lift your buttocks off of the floor until your waist is at almost the same height as your knees. You should feel the muscles working in your buttocks and the back of your thighs. If you do not feel these muscles, slide your feet 1-2 inches farther away from your buttocks. 4. Hold this position for 3-5 seconds. 5. Slowly lower your hips to  the starting position, and allow your buttocks muscles to relax completely. If this exercise is too easy, try doing it with your arms crossed over your chest. Abdominal Crunches Repeat these steps 5-10 times: 1. Lie on your back on a firm bed or the floor with your legs extended. 2. Bend your knees so they are pointing toward the ceiling and your feet are flat on the floor. 3. Cross your arms over your chest. 4. Tip your chin slightly toward your chest without bending your neck. 5. Tighten your abdominal muscles and slowly raise your trunk (torso) high enough to lift your shoulder blades a tiny bit off of the floor. Avoid raising your torso higher than that, because it can put too much stress on your low back and it does not help to strengthen your abdominal muscles. 6. Slowly return to your starting position. Back Lifts Repeat these steps 5-10 times: 1. Lie on your abdomen (face-down) with your arms at your sides, and rest your forehead on the floor. 2. Tighten the muscles in your legs and your buttocks. 3. Slowly lift your chest off of the floor while you keep your hips pressed to the floor. Keep the back of your head in line with the curve in your back. Your eyes should be looking at the floor. 4. Hold this position for 3-5 seconds. 5. Slowly return to your starting position. SEEK MEDICAL CARE IF:  Your back pain or discomfort gets much worse when you do an exercise.  Your back pain or discomfort does not lessen within 2 hours after you exercise. If you have any of these problems, stop doing these exercises right away. Do not do them again unless your health care provider says that you can. SEEK IMMEDIATE MEDICAL CARE IF:  You develop sudden, severe back pain. If this happens, stop doing the exercises right away. Do not do them again unless your health care provider says that you can.   This information is not intended to replace advice given to you by your health care provider. Make  sure you discuss any questions you have with your health care provider.   Document Released: 07/03/2004 Document Revised: 02/14/2015 Document Reviewed: 07/20/2014 Elsevier Interactive Patient Education [employer]

## 2015-09-13 NOTE — Assessment & Plan Note (Signed)
BP elevated due to pain. Will recheck at appt next month. Consider increasing ACE.  Check CMET.

## 2015-09-13 NOTE — Progress Notes (Signed)
Subjective:    Patient ID: Dean Clements, male    DOB: 13-Mar-1966, 50 y.o.   MRN: [id number]  HPI: Dean Clements is a 50 y.o. male presenting on 09/13/2015 for Back Pain   HPI  Back pain - Got a shot of toradol on Monday, helped with pain for about an hour and then it came back. Is taking Robaxin 4 times per day with minimal relief. He has not tried anything else currently. Sleeps with a heating pad. Pain is slightly worse in comparison to Monday - 8/10 and constant. It does not radiate. Is not waking him up in the night. Sitting is most comfortable. No incontience of bowel/bladder. No numbness or weakness of pelvis/BLE. Takes aleve 220 x2 every morning for chronic generalized pain.  HTN - is taking his lisinopril. No CP/SOB, vision changes, HA. DM: taking metformin as prescribed. Will get labwork for f/u appt.    Past Medical History  Diagnosis Date  . Hyperlipidemia   . Low back pain 2004    occasional back flares each year.     Current Outpatient Prescriptions on File Prior to Visit  Medication Sig  . FLUoxetine (PROZAC) 20 MG capsule TAKE 2 CAPSULES (40mg ) BY MOUTH ONCE DAILY  . lisinopril (PRINIVIL,ZESTRIL) 10 MG tablet Take 1 tablet (10 mg total) by mouth daily.  . metFORMIN (GLUCOPHAGE) 500 MG tablet Take 1 tablet (500 mg total) by mouth 2 (two) times daily with a meal.  . methocarbamol (ROBAXIN) 500 MG tablet Take 2 tablets (1,000 mg total) by mouth 4 (four) times daily.  . pantoprazole (PROTONIX) 40 MG tablet TAKE 1 TABLET BY MOUTH ONCE DAILY.   No current facility-administered medications on file prior to visit.    Review of Systems  Constitutional: Negative for fever and chills.  HENT: Negative.  Negative for hearing loss and trouble swallowing.   Eyes: Negative for visual disturbance.  Respiratory: Negative for chest tightness, shortness of breath and wheezing.   Cardiovascular: Negative for chest pain, palpitations and leg swelling.    Gastrointestinal: Negative for nausea, vomiting and abdominal pain.  Endocrine: Negative.   Genitourinary: Negative for dysuria, urgency, discharge, difficulty urinating, penile pain and testicular pain.  Musculoskeletal: Positive for back pain. Negative for joint swelling, arthralgias and gait problem.  Skin: Negative.  Negative for color change.  Neurological: Negative for dizziness, weakness, light-headedness, numbness and headaches.  Psychiatric/Behavioral: Negative for sleep disturbance and dysphoric mood.   Per HPI unless specifically indicated above     Objective:    BP 156/88 mmHg  Pulse 80  Temp(Src) 98 F (36.7 C) (Oral)  Resp 16  Ht 5\' 9"  (1.753 m)  Wt 260 lb (117.935 kg)  BMI 38.38 kg/m2  Wt Readings from Last 3 Encounters:  09/13/15 260 lb (117.935 kg)  09/10/15 260 lb (117.935 kg)  08/27/15 263 lb (119.296 kg)    Physical Exam  Constitutional: He is oriented to person, place, and time. He appears well-developed and well-nourished. No distress.  HENT:  Head: Normocephalic and atraumatic.  Neck: Normal range of motion. Neck supple. No thyromegaly present.  Cardiovascular: Normal rate, regular rhythm and normal heart sounds.  Exam reveals no gallop and no friction rub.   No murmur heard. Pulmonary/Chest: Effort normal and breath sounds normal. No respiratory distress. He has no wheezes.  Abdominal: Soft. Bowel sounds are normal. He exhibits no distension. There is no tenderness. There is no rebound.  Musculoskeletal: Normal range of motion. He exhibits no edema or  tenderness.       Lumbar back: He exhibits pain.       Back:  Straight leg raise negative bilaterally.  Neurological: He is alert and oriented to person, place, and time. He has normal reflexes.  Skin: Skin is warm and dry. No rash noted. No erythema.  Psychiatric: He has a normal mood and affect. His behavior is normal. Thought content normal.   Results for orders placed or performed during the  hospital encounter of 08/27/15  Hemoglobin A1c  Result Value Ref Range   Hgb A1c MFr Bld 6.5 (H) 4.0 - 6.0 %  Basic metabolic panel  Result Value Ref Range   Sodium 132 (L) 135 - 145 mmol/L   Potassium 3.8 3.5 - 5.1 mmol/L   Chloride 101 101 - 111 mmol/L   CO2 24 22 - 32 mmol/L   Glucose, Bld 157 (H) 65 - 99 mg/dL   BUN 11 6 - 20 mg/dL   Creatinine, Ser 1.61 0.61 - 1.24 mg/dL   Calcium 9.2 8.9 - 09.6 mg/dL   GFR calc non Af Amer >60 >60 mL/min   GFR calc Af Amer >60 >60 mL/min   Anion gap 7 5 - 15  Glucose, capillary  Result Value Ref Range   Glucose-Capillary 162 (H) 65 - 99 mg/dL      Assessment & Plan:   Problem List Items Addressed This Visit      Cardiovascular and Mediastinum   HBP (high blood pressure)    BP elevated due to pain. Will recheck at appt next month. Consider increasing ACE.  Check CMET.         Endocrine   New onset type 2 diabetes mellitus (HCC)    Will follow-up in 1 mos. Check CMET and Lipid in anticipation of visit.       Relevant Orders   Comprehensive metabolic panel   Lipid panel   Urine Microalbumin w/creat. ratio    Other Visit Diagnoses    Back pain without sciatica    -  Primary    Toradol inejection. Add 500 naproxen BID for pain. Continue robaxin. Consider PT if symptoms not improving. Discussed home treatment. Reviewed alarm symptoms.     Relevant Medications    naproxen (NAPROSYN) 500 MG tablet    ketorolac (TORADOL) injection 60 mg (Completed)    Other Relevant Orders    Comprehensive metabolic panel       Meds ordered this encounter  Medications  . DISCONTD: ketorolac (TORADOL) 60 MG/2ML SOLN injection    Sig: Inject 2 mLs (60 mg total) into the muscle once.    Dispense:  1 mL    Refill:  0  . naproxen (NAPROSYN) 500 MG tablet    Sig: Take 1 tablet (500 mg total) by mouth 2 (two) times daily with a meal.    Dispense:  30 tablet    Refill:  1    Order Specific Question:  Supervising Provider    Answer:  Janeann Forehand [phone]  . ketorolac (TORADOL) injection 60 mg    Sig:       Follow up plan: Return if symptoms worsen or fail to improve, for keep previously scheduled appt.Marland Kitchen

## 2015-09-15 LAB — LIPID PANEL
Chol/HDL Ratio: 4.5 ratio units (ref 0.0–5.0)
Cholesterol, Total: 255 mg/dL — ABNORMAL HIGH (ref 100–199)
HDL: 57 mg/dL (ref >39)
LDL Calculated: 153 mg/dL — ABNORMAL HIGH (ref 0–99)
Triglycerides: 227 mg/dL — ABNORMAL HIGH (ref 0–149)
VLDL Cholesterol Cal: 45 mg/dL — ABNORMAL HIGH (ref 5–40)

## 2015-09-15 LAB — COMPREHENSIVE METABOLIC PANEL
ALT: 54 IU/L — ABNORMAL HIGH (ref 0–44)
AST: 42 IU/L — ABNORMAL HIGH (ref 0–40)
Albumin/Globulin Ratio: 1.7 (ref 1.2–2.2)
Albumin: 4.7 g/dL (ref 3.5–5.5)
Alkaline Phosphatase: 87 IU/L (ref 39–117)
BUN/Creatinine Ratio: 13 (ref 9–20)
BUN: 11 mg/dL (ref 6–24)
Bilirubin Total: 0.7 mg/dL (ref 0.0–1.2)
CO2: 25 mmol/L (ref 18–29)
Calcium: 9.8 mg/dL (ref 8.7–10.2)
Chloride: 98 mmol/L (ref 96–106)
Creatinine, Ser: 0.83 mg/dL (ref 0.76–1.27)
GFR calc Af Amer: 119 mL/min/{1.73_m2} (ref >59)
GFR calc non Af Amer: 103 mL/min/{1.73_m2} (ref >59)
Globulin, Total: 2.8 g/dL (ref 1.5–4.5)
Glucose: 166 mg/dL — ABNORMAL HIGH (ref 65–99)
Potassium: 4.4 mmol/L (ref 3.5–5.2)
Sodium: 140 mmol/L (ref 134–144)
Total Protein: 7.5 g/dL (ref 6.0–8.5)

## 2015-09-15 LAB — MICROALBUMIN / CREATININE URINE RATIO
Creatinine, Urine: 112 mg/dL
MICROALB/CREAT RATIO: 14.8 mg/g creat (ref 0.0–30.0)
Microalbumin, Urine: 16.6 ug/mL

## 2015-09-25 ENCOUNTER — Other Ambulatory Visit: Payer: Self-pay | Admitting: Family Medicine

## 2015-09-25 DIAGNOSIS — M5489 Other dorsalgia: Secondary | ICD-10-CM

## 2015-09-26 MED ORDER — NAPROXEN 500 MG PO TABS: 500.0000 mg | ORAL_TABLET | Freq: Two times a day (BID) | ORAL | Status: DC

## 2015-10-08 ENCOUNTER — Encounter: Payer: Self-pay | Admitting: Family Medicine

## 2015-10-08 ENCOUNTER — Ambulatory Visit: Payer: No Typology Code available for payment source | Admitting: Family Medicine

## 2016-01-12 ENCOUNTER — Other Ambulatory Visit: Payer: Self-pay | Admitting: Family Medicine

## 2016-01-12 DIAGNOSIS — I10 Essential (primary) hypertension: Secondary | ICD-10-CM

## 2016-02-01 ENCOUNTER — Other Ambulatory Visit: Payer: Self-pay | Admitting: Family Medicine

## 2016-02-01 DIAGNOSIS — M5489 Other dorsalgia: Secondary | ICD-10-CM

## 2016-04-21 ENCOUNTER — Other Ambulatory Visit: Payer: Self-pay | Admitting: Family Medicine

## 2016-04-21 DIAGNOSIS — I10 Essential (primary) hypertension: Secondary | ICD-10-CM

## 2016-05-28 ENCOUNTER — Other Ambulatory Visit: Payer: Self-pay | Admitting: Family Medicine

## 2016-05-28 DIAGNOSIS — I10 Essential (primary) hypertension: Secondary | ICD-10-CM

## 2016-06-04 ENCOUNTER — Ambulatory Visit (INDEPENDENT_AMBULATORY_CARE_PROVIDER_SITE_OTHER): Payer: PRIVATE HEALTH INSURANCE | Admitting: Family Medicine

## 2016-06-04 ENCOUNTER — Encounter: Payer: Self-pay | Admitting: Family Medicine

## 2016-06-04 VITALS — BP 148/83 | HR 79 | Temp 97.8°F | Resp 16 | Ht 69.0 in | Wt 246.0 lb

## 2016-06-04 DIAGNOSIS — M549 Dorsalgia, unspecified: Secondary | ICD-10-CM

## 2016-06-04 DIAGNOSIS — M546 Pain in thoracic spine: Secondary | ICD-10-CM | POA: Diagnosis not present

## 2016-06-04 DIAGNOSIS — G8929 Other chronic pain: Secondary | ICD-10-CM | POA: Insufficient documentation

## 2016-06-04 DIAGNOSIS — M545 Low back pain, unspecified: Secondary | ICD-10-CM

## 2016-06-04 MED ORDER — KETOROLAC TROMETHAMINE 60 MG/2ML IM SOLN
60.0000 mg | Freq: Once | INTRAMUSCULAR | Status: AC
  Administered 2016-06-04: 60 mg via INTRAMUSCULAR

## 2016-06-04 MED ORDER — KETOROLAC TROMETHAMINE 60 MG/2ML IM SOLN: 60.0000 mg | Freq: Once | INTRAMUSCULAR | 1 refills | Status: DC | PRN

## 2016-06-04 MED ORDER — METHOCARBAMOL 750 MG PO TABS: 750.0000 mg | ORAL_TABLET | Freq: Four times a day (QID) | ORAL | 1 refills | Status: DC | PRN

## 2016-06-04 MED ORDER — NAPROXEN 500 MG PO TABS: 500.0000 mg | ORAL_TABLET | Freq: Two times a day (BID) | ORAL | 1 refills | Status: DC

## 2016-06-04 NOTE — Patient Instructions (Signed)
Thank you for coming in to clinic today.  1. For your Back Pain - I think that this is due to Muscle Spasms or strain. 2. Start with anti-inflammatory Naprosyn (Naproxen) 500mg  twice daily (12 hrs apart, with food, breakfast and dinner) every day for next 2 to 4 weeks if helping, then can use only as needed 3. Start Robaxin 750mg  take 1-2 tabs every 6 hours as needed, up to 4 times a day max dose, for 1-2 weeks then taper down, may make you sedated or sleepy (be careful driving or working on this) 4. May use Tylenol Extra Str 500mg  tabs - may take 1-2 tablets every 6 hours as needed 5. Recommend to start using heating pad on your lower back 1-2x daily for few weeks  Also sent rx Toradol vial to pharmacy  This pain may take weeks to months to fully resolve, but hopefully it will respond to the medicine initially. All back injuries (small or serious) are slow to heal since we use our back muscles every day. Be careful with turning, twisting, lifting, sitting / standing for prolonged periods, and avoid re-injury.  If your symptoms significantly worsen with more pain, or new symptoms with weakness in one or both legs, new or different shooting leg pains, numbness in legs or groin, loss of control or retention of urine or bowel movements, please call back for advice and you may need to go directly to the Emergency Department.  Please schedule a follow-up appointment with Dr. Althea CharonKaramalegos in 4-6 weeks if back pain not improved  If you have any other questions or concerns, please feel free to call the clinic or send a message through MyChart. You may also schedule an earlier appointment if necessary.  Saralyn PilarAlexander Karamalegos, DO [facility]
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
## 2016-07-04 ENCOUNTER — Telehealth: Payer: Self-pay | Admitting: Family Medicine

## 2016-07-04 ENCOUNTER — Ambulatory Visit: Payer: PRIVATE HEALTH INSURANCE | Admitting: Family Medicine

## 2016-07-04 DIAGNOSIS — M545 Low back pain, unspecified: Secondary | ICD-10-CM

## 2016-07-04 DIAGNOSIS — M546 Pain in thoracic spine: Secondary | ICD-10-CM

## 2016-07-04 MED ORDER — PREDNISONE 20 MG PO TABS: ORAL_TABLET | ORAL | 0 refills | Status: DC

## 2016-07-04 NOTE — Telephone Encounter (Signed)
Pt. Called states that he was still having  Back pain and wanted to know if he could have an  MRI . Pt  Call back  # is   [phone]

## 2016-07-04 NOTE — Telephone Encounter (Signed)
Called patient and left message that he will need to schedule office visit for 4-6 week follow-up.

## 2016-07-04 NOTE — Telephone Encounter (Signed)
Last visit with me 06/04/16 for initial evaluation of Acute on Chronic Low Back Pain, at that time management was primarily conservative with NSAIDs, toraldol injection and muscle relaxant.  It has now been almost exactly 4 weeks since that office visit, patient was asked to follow-up 4-6 weeks if not significantly improved.  We cannot order MRI as next step in process. He will need X-ray imaging of Lumbar Spine first, and an office visit documenting follow-up clinical progress at 6 weeks from initial date of evaluation.  Please contact patient to have him schedule office visit to follow-up within 2 weeks, either week of Feb 12th or following week. Then we can proceed with X-rays in office, and based on those results determine if we can proceed with MRI next.  [name], DO [facility] 07/04/2016, 11:05 AM

## 2016-07-04 NOTE — Telephone Encounter (Signed)
Called patient back, after he scheduled for visit for today 07/04/16 for Back Pain Follow-up.  He states still having midline low back pain mostly constant, still does not have any neuropathic radiating radicular pain, and he thinks that the muscle spasms associated with pain are improved, but the main pain is still significant and not improved after prior Toradol injections (does at home) and Naproxen course also continuing muscle relaxant.  Recommended trial of Prednisone anti-inflammatory taper over 7 days, sent in to pharmacy. He is to stop Naproxen, not take Ibuprofen or Naproxen while on prednisone.  Re-schedule apt today to next week Tuesday 1/30 at 4:00pm, for several reasons, one to see if prednisone is helping, also to get X-rays of Lumbar Spine, as we do not have X-ray available in office on Fridays today. Lastly to comply with appropriate timing of follow-up since if he is considering future MRI, we will need to check on him at least 4 weeks after last visit, and it has not quite been a full 4 weeks yet.  He understands, re-scheduled. Follow-up, will consider adding other med such as Gabapentin and/or Meloxicam at future visit if needed.  Dean PilarAlexander Gricel Copen, DO [facility] 07/04/2016, 12:05 PM

## 2016-07-04 NOTE — Addendum Note (Signed)
Addended by: Smitty CordsKARAMALEGOS, ALEXANDER J on: 07/04/2016 12:07 PM   Modules accepted: Orders

## 2016-07-08 ENCOUNTER — Ambulatory Visit: Payer: PRIVATE HEALTH INSURANCE | Admitting: Family Medicine

## 2016-07-16 ENCOUNTER — Encounter: Payer: Self-pay | Admitting: Family Medicine

## 2016-07-16 ENCOUNTER — Ambulatory Visit (INDEPENDENT_AMBULATORY_CARE_PROVIDER_SITE_OTHER): Payer: PRIVATE HEALTH INSURANCE | Admitting: Family Medicine

## 2016-07-16 ENCOUNTER — Ambulatory Visit
Admission: RE | Admit: 2016-07-16 | Discharge: 2016-07-16 | Disposition: A | Discharge status: Home / Self Care | Payer: PRIVATE HEALTH INSURANCE | Source: Ambulatory Visit | Attending: Family Medicine | Admitting: Family Medicine

## 2016-07-16 VITALS — BP 140/83 | HR 95 | Temp 98.2°F | Resp 16 | Ht 69.0 in | Wt 253.0 lb

## 2016-07-16 DIAGNOSIS — M5441 Lumbago with sciatica, right side: Secondary | ICD-10-CM

## 2016-07-16 DIAGNOSIS — M546 Pain in thoracic spine: Secondary | ICD-10-CM

## 2016-07-16 DIAGNOSIS — M47897 Other spondylosis, lumbosacral region: Secondary | ICD-10-CM | POA: Insufficient documentation

## 2016-07-16 DIAGNOSIS — G8929 Other chronic pain: Secondary | ICD-10-CM

## 2016-07-16 DIAGNOSIS — M47896 Other spondylosis, lumbar region: Secondary | ICD-10-CM | POA: Diagnosis not present

## 2016-07-16 MED ORDER — HYDROCODONE-ACETAMINOPHEN 5-325 MG PO TABS: 1.0000 | ORAL_TABLET | ORAL | 0 refills | Status: DC | PRN

## 2016-07-16 MED ORDER — TIZANIDINE HCL 2 MG PO CAPS: 2.0000 mg | ORAL_CAPSULE | Freq: Three times a day (TID) | ORAL | 1 refills | Status: DC | PRN
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
## 2016-07-16 NOTE — Patient Instructions (Addendum)
Thank you for coming in to clinic today.  1. For your Back Pain - I'm concerned that you're back pain is not improving as discussed - Lumbar X-rays today, will notify you once results are available - Stop Robaxin - Start Tizanidine Muscle relaxant 2mg  capsules take 1 up to 3 times a day as needed for pain/spasms, if not improved after few days, you can increase dose to 2 capsules or 4 mg per dose up to 3 times a day, slowly increase to avoid oversedatoin  - As discussed, only limited temporary rx for Hydrocodone 5/325 take 1 tab every 4 hours as needed for pain, up to max 5 pills in 24 hours, this is 5 day supply, cannot refill this rx  Referral placed  [facility] [address] [place], [place]  [id number] Phone: [phone]  Stay tuned for appointment, call if not heard back in 2 weeks  4. May use Tylenol Extra Str 500mg  tabs - may take 1-2 tablets every 6 hours as needed 5. Recommend to start using heating pad on your lower back 1-2x daily for few weeks  This pain may take weeks to months to fully resolve, but hopefully it will respond to the medicine initially. All back injuries (small or serious) are slow to heal since we use our back muscles every day. Be careful with turning, twisting, lifting, sitting / standing for prolonged periods, and avoid re-injury.  If your symptoms significantly worsen with more pain, or new symptoms with weakness in one or both legs, new or different shooting leg pains, numbness in legs or groin, loss of control or retention of urine or bowel movements, please call back for advice and you may need to go directly to the Emergency Department.  Please schedule a follow-up appointment with Dr. Althea CharonKaramalegos in 4-6 weeks if back pain not improved  If you have any other questions or concerns, please feel free to call the clinic or send a message through MyChart. You may also schedule an earlier appointment if  necessary.  Saralyn PilarAlexander Gurdeep Keesey, DO [facility]
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
## 2016-07-28 ENCOUNTER — Other Ambulatory Visit: Payer: Self-pay | Admitting: Family Medicine

## 2016-07-28 DIAGNOSIS — I10 Essential (primary) hypertension: Secondary | ICD-10-CM

## 2016-10-06 ENCOUNTER — Other Ambulatory Visit: Payer: Self-pay

## 2016-10-06 DIAGNOSIS — E119 Type 2 diabetes mellitus without complications: Secondary | ICD-10-CM

## 2016-10-06 MED ORDER — METFORMIN HCL 500 MG PO TABS: 500.0000 mg | ORAL_TABLET | Freq: Two times a day (BID) | ORAL | 11 refills | Status: DC

## 2016-10-06 NOTE — Telephone Encounter (Signed)
Last ov 07/16/16  Last filled 09/10/15

## 2016-10-29 ENCOUNTER — Telehealth: Payer: Self-pay | Admitting: Family Medicine

## 2016-10-29 DIAGNOSIS — M545 Low back pain, unspecified: Secondary | ICD-10-CM

## 2016-10-29 MED ORDER — KETOROLAC TROMETHAMINE 60 MG/2ML IM SOLN: 60.0000 mg | Freq: Once | INTRAMUSCULAR | 1 refills | Status: DC | PRN

## 2016-10-29 NOTE — Telephone Encounter (Signed)
Please notify patient that I do not plan to refill Hydrocodone. I gave him a one time temporary supply in 07/2016.  Due to his complex chronic back pain he was [facility], and it appears that they were considering a Lumbar Spine MRI as of last update in 08/2016. He was prescribed pain medicine from their office, last rx for Hydrocodone #40 tablets on 08/18/16. I do not see any further follow-up apt or communication with Ortho since then.  He needs to check back with [facility] for requests on Hydrocodone or Tramadol.  I can send refill on Toradol injection to [facility], he should still be cautious with this and inform his Orthopedic physician if he is needing to use this.  Additionally I have checked the [place] CSRS for past 1 year for his controlled substance rx.  Saralyn PilarAlexander Karamalegos, DO [facility] 10/29/2016, 6:13 PM

## 2016-10-29 NOTE — Telephone Encounter (Signed)
Pt walked in requesting a refill on  Toradol, Hydrocodone, [facility]. Pt call bac # is [phone].

## 2016-10-30 NOTE — Telephone Encounter (Signed)
lmtcb

## 2016-10-31 NOTE — Telephone Encounter (Signed)
Pt informed as per Dr. Karamalegos. 

## 2016-11-07 ENCOUNTER — Encounter: Payer: Self-pay | Admitting: Family Medicine

## 2016-11-07 ENCOUNTER — Other Ambulatory Visit: Payer: Self-pay | Admitting: Family Medicine

## 2016-11-07 DIAGNOSIS — M545 Low back pain, unspecified: Secondary | ICD-10-CM

## 2016-11-07 MED ORDER — KETOROLAC TROMETHAMINE 60 MG/2ML IM SOLN: 60.0000 mg | Freq: Once | INTRAMUSCULAR | 1 refills | Status: DC | PRN

## 2016-11-09 ENCOUNTER — Emergency Department
Admission: EM | Admit: 2016-11-09 | Discharge: 2016-11-09 | Disposition: A | Discharge status: Home / Self Care | Payer: PRIVATE HEALTH INSURANCE | Attending: Emergency Medicine | Admitting: Emergency Medicine

## 2016-11-09 ENCOUNTER — Encounter: Payer: Self-pay | Admitting: Emergency Medicine

## 2016-11-09 DIAGNOSIS — Z79899 Other long term (current) drug therapy: Secondary | ICD-10-CM | POA: Insufficient documentation

## 2016-11-09 DIAGNOSIS — M549 Dorsalgia, unspecified: Secondary | ICD-10-CM | POA: Diagnosis not present

## 2016-11-09 DIAGNOSIS — Z7984 Long term (current) use of oral hypoglycemic drugs: Secondary | ICD-10-CM | POA: Diagnosis not present

## 2016-11-09 DIAGNOSIS — G8929 Other chronic pain: Secondary | ICD-10-CM | POA: Diagnosis not present

## 2016-11-09 DIAGNOSIS — Z87891 Personal history of nicotine dependence: Secondary | ICD-10-CM | POA: Diagnosis not present

## 2016-11-09 DIAGNOSIS — M545 Low back pain: Secondary | ICD-10-CM | POA: Diagnosis present

## 2016-11-09 DIAGNOSIS — E119 Type 2 diabetes mellitus without complications: Secondary | ICD-10-CM | POA: Diagnosis not present

## 2016-11-09 MED ORDER — MELOXICAM 15 MG PO TABS: 15.0000 mg | ORAL_TABLET | Freq: Every day | ORAL | 0 refills | Status: DC

## 2016-11-09 MED ORDER — ORPHENADRINE CITRATE 30 MG/ML IJ SOLN
60.0000 mg | Freq: Two times a day (BID) | INTRAMUSCULAR | Status: DC
  Administered 2016-11-09: 60 mg via INTRAMUSCULAR
  Filled 2016-11-09: qty 2

## 2016-11-09 MED ORDER — METHOCARBAMOL 750 MG PO TABS: 750.0000 mg | ORAL_TABLET | Freq: Four times a day (QID) | ORAL | 0 refills | Status: DC
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
## 2016-11-09 NOTE — ED Triage Notes (Signed)
Pt in via POV with complaints of chronic lower back pain, pt reports using toradol injection at home on Tuesday without any relief.  Pt reports pain worse with movement.  NAD noted at this time.
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
## 2016-11-26 ENCOUNTER — Emergency Department
Admission: EM | Admit: 2016-11-26 | Discharge: 2016-11-26 | Disposition: A | Discharge status: Home / Self Care | Payer: PRIVATE HEALTH INSURANCE | Attending: Emergency Medicine | Admitting: Emergency Medicine

## 2016-11-26 ENCOUNTER — Emergency Department: Payer: PRIVATE HEALTH INSURANCE

## 2016-11-26 ENCOUNTER — Ambulatory Visit (INDEPENDENT_AMBULATORY_CARE_PROVIDER_SITE_OTHER)
Admission: EM | Admit: 2016-11-26 | Discharge: 2016-11-26 | Disposition: A | Discharge status: Hospice | Payer: PRIVATE HEALTH INSURANCE | Source: Home / Self Care | Attending: Emergency Medicine | Admitting: Emergency Medicine

## 2016-11-26 ENCOUNTER — Encounter: Payer: Self-pay | Admitting: *Deleted

## 2016-11-26 ENCOUNTER — Encounter: Payer: Self-pay | Admitting: Emergency Medicine

## 2016-11-26 DIAGNOSIS — R0789 Other chest pain: Secondary | ICD-10-CM | POA: Insufficient documentation

## 2016-11-26 DIAGNOSIS — E7800 Pure hypercholesterolemia, unspecified: Secondary | ICD-10-CM

## 2016-11-26 DIAGNOSIS — Z8249 Family history of ischemic heart disease and other diseases of the circulatory system: Secondary | ICD-10-CM | POA: Insufficient documentation

## 2016-11-26 DIAGNOSIS — N289 Disorder of kidney and ureter, unspecified: Secondary | ICD-10-CM

## 2016-11-26 DIAGNOSIS — Z79899 Other long term (current) drug therapy: Secondary | ICD-10-CM

## 2016-11-26 DIAGNOSIS — Z833 Family history of diabetes mellitus: Secondary | ICD-10-CM | POA: Insufficient documentation

## 2016-11-26 DIAGNOSIS — E669 Obesity, unspecified: Secondary | ICD-10-CM | POA: Insufficient documentation

## 2016-11-26 DIAGNOSIS — R42 Dizziness and giddiness: Secondary | ICD-10-CM | POA: Diagnosis not present

## 2016-11-26 DIAGNOSIS — R55 Syncope and collapse: Secondary | ICD-10-CM | POA: Insufficient documentation

## 2016-11-26 DIAGNOSIS — R000 Tachycardia, unspecified: Secondary | ICD-10-CM

## 2016-11-26 DIAGNOSIS — Z809 Family history of malignant neoplasm, unspecified: Secondary | ICD-10-CM

## 2016-11-26 DIAGNOSIS — R079 Chest pain, unspecified: Secondary | ICD-10-CM | POA: Diagnosis present

## 2016-11-26 DIAGNOSIS — I959 Hypotension, unspecified: Secondary | ICD-10-CM | POA: Diagnosis not present

## 2016-11-26 DIAGNOSIS — Z6833 Body mass index (BMI) 33.0-33.9, adult: Secondary | ICD-10-CM | POA: Diagnosis not present

## 2016-11-26 DIAGNOSIS — I10 Essential (primary) hypertension: Secondary | ICD-10-CM

## 2016-11-26 DIAGNOSIS — R61 Generalized hyperhidrosis: Secondary | ICD-10-CM | POA: Insufficient documentation

## 2016-11-26 DIAGNOSIS — Z7984 Long term (current) use of oral hypoglycemic drugs: Secondary | ICD-10-CM | POA: Insufficient documentation

## 2016-11-26 DIAGNOSIS — N179 Acute kidney failure, unspecified: Secondary | ICD-10-CM | POA: Insufficient documentation

## 2016-11-26 DIAGNOSIS — E119 Type 2 diabetes mellitus without complications: Secondary | ICD-10-CM | POA: Insufficient documentation

## 2016-11-26 DIAGNOSIS — R110 Nausea: Secondary | ICD-10-CM | POA: Diagnosis not present

## 2016-11-26 DIAGNOSIS — R0602 Shortness of breath: Secondary | ICD-10-CM

## 2016-11-26 DIAGNOSIS — Z87891 Personal history of nicotine dependence: Secondary | ICD-10-CM | POA: Insufficient documentation

## 2016-11-26 DIAGNOSIS — E860 Dehydration: Secondary | ICD-10-CM | POA: Insufficient documentation

## 2016-11-26 HISTORY — DX: Type 2 diabetes mellitus without complications: E11.9

## 2016-11-26 HISTORY — DX: Essential (primary) hypertension: I10

## 2016-11-26 LAB — CBC WITH DIFFERENTIAL/PLATELET
Basophils Absolute: 0.1 10*3/uL (ref 0–0.1)
Basophils Relative: 1 %
Eosinophils Absolute: 0.1 10*3/uL (ref 0–0.7)
Eosinophils Relative: 1 %
HCT: 50.7 % (ref 40.0–52.0)
Hemoglobin: 17.4 g/dL (ref 13.0–18.0)
Lymphocytes Relative: 16 %
Lymphs Abs: 2.3 10*3/uL (ref 1.0–3.6)
MCH: 31.9 pg (ref 26.0–34.0)
MCHC: 34.4 g/dL (ref 32.0–36.0)
MCV: 92.9 fL (ref 80.0–100.0)
Monocytes Absolute: 1.5 10*3/uL — ABNORMAL HIGH (ref 0.2–1.0)
Monocytes Relative: 10 %
Neutro Abs: 10.3 10*3/uL — ABNORMAL HIGH (ref 1.4–6.5)
Neutrophils Relative %: 72 %
Platelets: 162 10*3/uL (ref 150–440)
RBC: 5.45 MIL/uL (ref 4.40–5.90)
RDW: 12.8 % (ref 11.5–14.5)
WBC: 14.3 10*3/uL — ABNORMAL HIGH (ref 3.8–10.6)

## 2016-11-26 LAB — COMPREHENSIVE METABOLIC PANEL
ALT: 65 U/L — ABNORMAL HIGH (ref 17–63)
AST: 100 U/L — ABNORMAL HIGH (ref 15–41)
Albumin: 5 g/dL (ref 3.5–5.0)
Alkaline Phosphatase: 70 U/L (ref 38–126)
Anion gap: 16 — ABNORMAL HIGH (ref 5–15)
BUN: 31 mg/dL — ABNORMAL HIGH (ref 6–20)
CO2: 20 mmol/L — ABNORMAL LOW (ref 22–32)
Calcium: 9.8 mg/dL (ref 8.9–10.3)
Chloride: 97 mmol/L — ABNORMAL LOW (ref 101–111)
Creatinine, Ser: 2.86 mg/dL — ABNORMAL HIGH (ref 0.61–1.24)
GFR calc Af Amer: 28 mL/min — ABNORMAL LOW (ref >60)
GFR calc non Af Amer: 24 mL/min — ABNORMAL LOW (ref >60)
Glucose, Bld: 227 mg/dL — ABNORMAL HIGH (ref 65–99)
Potassium: 3.6 mmol/L (ref 3.5–5.1)
Sodium: 133 mmol/L — ABNORMAL LOW (ref 135–145)
Total Bilirubin: 3 mg/dL — ABNORMAL HIGH (ref 0.3–1.2)
Total Protein: 8.8 g/dL — ABNORMAL HIGH (ref 6.5–8.1)

## 2016-11-26 LAB — CBC
HCT: 43.4 % (ref 40.0–52.0)
Hemoglobin: 15.1 g/dL (ref 13.0–18.0)
MCH: 32 pg (ref 26.0–34.0)
MCHC: 34.8 g/dL (ref 32.0–36.0)
MCV: 91.9 fL (ref 80.0–100.0)
Platelets: 190 10*3/uL (ref 150–440)
RBC: 4.72 MIL/uL (ref 4.40–5.90)
RDW: 12.7 % (ref 11.5–14.5)
WBC: 12.8 10*3/uL — ABNORMAL HIGH (ref 3.8–10.6)

## 2016-11-26 LAB — BASIC METABOLIC PANEL
Anion gap: 11 (ref 5–15)
Anion gap: 7 (ref 5–15)
BUN: 30 mg/dL — ABNORMAL HIGH (ref 6–20)
BUN: 27 mg/dL — ABNORMAL HIGH (ref 6–20)
CO2: 22 mmol/L (ref 22–32)
CO2: 23 mmol/L (ref 22–32)
Calcium: 8.6 mg/dL — ABNORMAL LOW (ref 8.9–10.3)
Calcium: 7.9 mg/dL — ABNORMAL LOW (ref 8.9–10.3)
Chloride: 102 mmol/L (ref 101–111)
Chloride: 106 mmol/L (ref 101–111)
Creatinine, Ser: 2.65 mg/dL — ABNORMAL HIGH (ref 0.61–1.24)
Creatinine, Ser: 1.77 mg/dL — ABNORMAL HIGH (ref 0.61–1.24)
GFR calc Af Amer: 30 mL/min — ABNORMAL LOW (ref >60)
GFR calc Af Amer: 50 mL/min — ABNORMAL LOW (ref >60)
GFR calc non Af Amer: 26 mL/min — ABNORMAL LOW (ref >60)
GFR calc non Af Amer: 43 mL/min — ABNORMAL LOW (ref >60)
Glucose, Bld: 170 mg/dL — ABNORMAL HIGH (ref 65–99)
Glucose, Bld: 125 mg/dL — ABNORMAL HIGH (ref 65–99)
Potassium: 3.4 mmol/L — ABNORMAL LOW (ref 3.5–5.1)
Potassium: 3.8 mmol/L (ref 3.5–5.1)
Sodium: 135 mmol/L (ref 135–145)
Sodium: 136 mmol/L (ref 135–145)

## 2016-11-26 LAB — CK
Total CK: 562 U/L — ABNORMAL HIGH (ref 49–397)
Total CK: 420 U/L — ABNORMAL HIGH (ref 49–397)

## 2016-11-26 LAB — GLUCOSE, CAPILLARY: Glucose-Capillary: 203 mg/dL — ABNORMAL HIGH (ref 65–99)

## 2016-11-26 LAB — TROPONIN I: Troponin I: <0.03 ng/mL (ref <0.03)

## 2016-11-26 LAB — MAGNESIUM: Magnesium: 1.4 mg/dL — ABNORMAL LOW (ref 1.7–2.4)

## 2016-11-26 MED ORDER — SODIUM CHLORIDE 0.9 % IV BOLUS (SEPSIS)
1000.0000 mL | Freq: Once | INTRAVENOUS | Status: AC
  Administered 2016-11-26: 1000 mL via INTRAVENOUS

## 2016-11-26 MED ORDER — ASPIRIN 81 MG PO CHEW
324.0000 mg | CHEWABLE_TABLET | Freq: Once | ORAL | Status: AC
  Administered 2016-11-26: 324 mg via ORAL

## 2016-11-26 MED ORDER — SODIUM CHLORIDE 0.9 % IV BOLUS (SEPSIS)
1000.0000 mL | Freq: Once | INTRAVENOUS | Status: AC
  Administered 2016-11-26 (×2): 1000 mL via INTRAVENOUS

## 2016-11-26 NOTE — ED Notes (Signed)
Patient transported to X-ray 

## 2016-11-26 NOTE — ED Notes (Signed)
Recollect of green tube sent to lab. 

## 2016-11-26 NOTE — ED Triage Notes (Signed)
Pt comes into the ED via [facility] where he went due to severe cramps generalized throughout his body, lightheadedness, chest pressure, and mild shortness of breath.  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  Patient has even and unlabored respirations.  Initial BP has 80 systolic [facility].  Patient given 324 asp and 1 L bolus.

## 2016-11-26 NOTE — ED Provider Notes (Signed)
HPI  SUBJECTIVE:  Dean Clements is a 51 y.o. male who presents with 2 days of "not feeling right". 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 He reports cramps in his hands, legs, signs of his abdomen yesterday and today reports lightheadedness and dizziness lasting seconds with worse with heat, exertion and better with sitting in front of a fan. He states that the cramps are intermittent, lasts approximately 20 seconds and then resolved. He also reports chest pain described as heaviness with radiation to his neck also lasting seconds. It seems to be worse with exertion. He reports diaphoresis, nausea with this. This started today. He also reports sharp, intermittent, substernal chest pain that is not associated with torso or movement or no exertional component. His chest pain seems to worse with leaning forward and better with lying down flat. He has not tried anything for this. He reports shortness of breath described as "hard to breathe" and presyncopal symptoms. He reports the chest heaviness palpitations or shortness of breath while feeling presyncopal. No syncope. He does state that the color of his urine is much darker than usual. Denies hematuria. No calf pain, swelling, or extremity edema. He has a past medical history of hypertension, diabetes, hypercholesterolemia, smoking, obesity. No history of asthma, emphysema, COPD, syncope, PE, DVT. Family history for significant with father and his MIs beginning at age 43. No history of PE, DVT. PMD: [facility].   Past Medical History:  Diagnosis Date  . Diabetes mellitus without complication (HCC)   . Hyperlipidemia   . Hypertension   . Low back pain 2004   occasional back flares each year.     Past Surgical History:  Procedure Laterality Date  . none      Family History  Problem Relation Age of Onset  . Cancer Mother   . Heart disease Father   . Diabetes Father   .  Hypertension Father     Social History  Substance Use Topics  . Smoking status: Former Smoker    Packs/day: 0.20    Years: 3.00    Types: Cigarettes  . Smokeless tobacco: Never Used  . Alcohol use 0.0 oz/week    No current facility-administered medications for this encounter.   Current Outpatient Prescriptions:  .  FLUoxetine (PROZAC) 20 MG capsule, TAKE 2 CAPSULES (40mg ) BY MOUTH ONCE DAILY, Disp: 60 capsule, Rfl: 5 .  lisinopril (PRINIVIL,ZESTRIL) 10 MG tablet, Take 1 tablet (10 mg total) by mouth daily., Disp: 30 tablet, Rfl: 11 .  meloxicam (MOBIC) 15 MG tablet, Take 1 tablet (15 mg total) by mouth daily., Disp: 30 tablet, Rfl: 0 .  metFORMIN (GLUCOPHAGE) 500 MG tablet, Take 1 tablet (500 mg total) by mouth 2 (two) times daily with a meal., Disp: 60 tablet, Rfl: 11 .  methocarbamol (ROBAXIN-750) 750 MG tablet, Take 1 tablet (750 mg total) by mouth 4 (four) times daily., Disp: 30 tablet, Rfl: 0 .  pantoprazole (PROTONIX) 40 MG tablet, TAKE 1 TABLET BY MOUTH ONCE DAILY., Disp: 30 tablet, Rfl: 5 .  HYDROcodone-acetaminophen (NORCO/VICODIN) 5-325 MG tablet, Take 1 tablet by mouth every 4 (four) hours as needed for moderate pain or severe pain., Disp: 25 tablet, Rfl: 0 .  ketorolac (TORADOL) 60 MG/2ML SOLN injection, INJECT 2 MLS INTO MUSCLE AS NEEDED FOR SEVERE PAIN, Disp: 2 mL, Rfl: 1 .  naproxen (NAPROSYN) 500 MG tablet, Take 1 tablet (500 mg total) by mouth 2 (two) times  daily with a meal. For 2-4 weeks, then as needed., Disp: 60 tablet, Rfl: 1 .  tizanidine (ZANAFLEX) 2 MG capsule, Take 1-2 capsules (2-4 mg total) by mouth 3 (three) times daily as needed for muscle spasms., Disp: 30 capsule, Rfl: 1  No Known Allergies   ROS  As noted in HPI.   Physical Exam  BP (!) 84/64 (BP Location: Left Arm)   Pulse (!) 119   Temp 98 F (36.7 C) (Oral)   Resp 20   Ht 5\' 11"  (1.803 m)   Wt 242 lb (109.8 kg)   SpO2 99%   BMI 33.75 kg/m   BP Readings from Last 3 Encounters:   11/26/16 (!) 86/61  11/09/16 (!) 156/102  07/16/16 140/83   Constitutional: Well developed, well nourished, no acute distress Eyes: PERRL, EOMI, conjunctiva normal bilaterally HENT: Normocephalic, atraumatic,mucus membranes moist Respiratory: Clear to auscultation bilaterally, no rales, no wheezing, no rhonchi Cardiovascular: Normal rate and rhythm, no murmurs, no gallops, no rubs GI: Soft, nondistended, normal bowel sounds, nontender, no rebound, no guarding Back: no CVAT skin: No rash, skin intact Musculoskeletal: No edema, no tenderness, no deformities Neurologic: Alert & oriented x 3, CN II-XII grossly intact, no motor deficits, sensation grossly intact Psychiatric: Speech and behavior appropriate   ED Course   Medications  sodium chloride 0.9 % bolus 1,000 mL (1,000 mLs Intravenous New Bag/Given 11/26/16 1317)    Orders Placed This Encounter  Procedures  . CBC with Differential    Standing Status:   Standing    Number of Occurrences:   1  . Comprehensive metabolic panel    Standing Status:   Standing    Number of Occurrences:   1  . Urinalysis, Complete w Microscopic    Standing Status:   Standing    Number of Occurrences:   1  . CK    Standing Status:   Standing    Number of Occurrences:   1  . Magnesium    Standing Status:   Standing    Number of Occurrences:   1  . Glucose, capillary    Standing Status:   Standing    Number of Occurrences:   1  . CBG monitoring, ED    Standing Status:   Standing    Number of Occurrences:   1  . ED EKG    Standing Status:   Standing    Number of Occurrences:   1    Order Specific Question:   Reason for Exam    Answer:   Weakness  . EKG 12-Lead    Standing Status:   Standing    Number of Occurrences:   1  . Insert peripheral IV    Standing Status:   Standing    Number of Occurrences:   1   Results for orders placed or performed during the hospital encounter of 11/26/16 (from the past 24 hour(s))  Glucose, capillary      Status: Abnormal   Collection Time: 11/26/16  1:03 PM  Result Value Ref Range   Glucose-Capillary 203 (H) 65 - 99 mg/dL   No results found.  ED Clinical Impression  Hypotension, unspecified hypotension type  Chest pain, unspecified type   ED Assessment/Plan  repeat BP 86/61 after 1st liter HR 99. Giving additional fluid bolus Putting in second IV.  Fingerstick 203  EKG: Sinus tachycardia, rate 107, left-sided axis deviation, normal intervals. No ST-T wave changes. No previous EKG for comparison.  Transferring to the ED,  particularly with the hypotension post first liter and chest pain. His EKG is normal. However feel that he would benefit her comprehensive workup and observation per generally given his cardiac risk factors and family history. This may be just dehydration/heat exhaustion versus rhabdomyolysis. CBC, CMP, CK, pending.  Transferring to the [facility] ED via EMS. He agrees with plan. D/w Katie,charge RN.  Meds ordered this encounter  Medications  . sodium chloride 0.9 % bolus 1,000 mL    *This clinic note was created using Scientist, clinical (histocompatibility and immunogenetics). Therefore, there may be occasional mistakes despite careful proofreading.  ?   Domenick Gong, MD 11/26/16 1539

## 2016-11-26 NOTE — Discharge Instructions (Signed)
As we discussed he was seen in the emergency department today for dehydration. Please continue to drink plenty of water over the next 24-48 hours. Please follow-up with her primary care doctor on Friday for recheck of your kidney function. Your creatinine upon discharge today is 1.77. Return to the emergency Department for any dizziness/lightheadedness, nausea vomiting, or any other symptom personally concerning to yourself.

## 2016-11-26 NOTE — ED Provider Notes (Signed)
[facility] Emergency Department Provider Note  Time seen: 3:18 PM  I have reviewed the triage vital signs and the nursing notes.   HISTORY  Chief Complaint Chest Pain and Dizziness    HPI Dean Clements is a 51 y.o. male with a past medical history of diabetes, hypertension, hyperlipidemia presents to the emergency department for generalized weakness and not feeling well. 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 States yesterday he was experiencing nausea and extreme muscle cramping. States when he awoke this morning he was feeling lightheaded and dizzy like something was not right. He went to work however after several hours he had to go lay down because of this feeling. Continue with muscle cramping today. Has noted very dark yellow urine. States intermittent nausea but denies vomiting. Denies diarrhea. Patient states muscle cramping including chest muscles that time but denies any "chest pain." States today he was feeling somewhat short of breath and lightheaded along with the muscle cramping so he went to med in urgent care for evaluation. The patient was sent to the emergency department for closer evaluation. Currently the patient appears well, states she is already feeling better after receiving 1 L normal saline. Denies any chest pain shortness of breath or lightheadedness currently.     Past Medical History:  Diagnosis Date  . Diabetes mellitus without complication (HCC)   . Hyperlipidemia   . Hypertension   . Low back pain 2004   occasional back flares each year.     Patient Active Problem List   Diagnosis Date Noted  . Chronic bilateral back pain 06/04/2016  . New onset type 2 diabetes mellitus (HCC) 09/10/2015  . HBP (high blood pressure) 04/03/2015  . Obesity 04/03/2015  . Anxiety 04/03/2015  . Need for influenza vaccination 04/03/2015  . GERD  (gastroesophageal reflux disease) 01/15/2015  . Insomnia 01/15/2015  . Severe obesity (BMI 35.0-39.9) (HCC) 01/15/2015    Past Surgical History:  Procedure Laterality Date  . none      Prior to Admission medications   Medication Sig Start Date End Date Taking? Authorizing Provider  FLUoxetine (PROZAC) 20 MG capsule TAKE 2 CAPSULES (40mg ) BY MOUTH ONCE DAILY 01/14/16   Krebs, Laurel DimmerAmy Lauren, NP  HYDROcodone-acetaminophen (NORCO/VICODIN) 5-325 MG tablet Take 1 tablet by mouth every 4 (four) hours as needed for moderate pain or severe pain. 07/16/16   Karamalegos, Netta NeatAlexander J, DO  ketorolac (TORADOL) 60 MG/2ML SOLN injection INJECT 2 MLS INTO MUSCLE AS NEEDED FOR SEVERE PAIN 11/07/16   Althea CharonKaramalegos, Netta NeatAlexander J, DO  lisinopril (PRINIVIL,ZESTRIL) 10 MG tablet Take 1 tablet (10 mg total) by mouth daily. 07/28/16   Karamalegos, Netta NeatAlexander J, DO  meloxicam (MOBIC) 15 MG tablet Take 1 tablet (15 mg total) by mouth daily. 11/09/16   Triplett, Rulon Eisenmengerari B, FNP  metFORMIN (GLUCOPHAGE) 500 MG tablet Take 1 tablet (500 mg total) by mouth 2 (two) times daily with a meal. 10/06/16   Karamalegos, Netta NeatAlexander J, DO  methocarbamol (ROBAXIN-750) 750 MG tablet Take 1 tablet (750 mg total) by mouth 4 (four) times daily. 11/09/16   Triplett, Cari B, FNP  naproxen (NAPROSYN) 500 MG tablet Take 1 tablet (500 mg total) by mouth 2 (two) times daily with a meal. For 2-4 weeks, then as needed. 06/04/16   Karamalegos, Netta NeatAlexander J, DO  pantoprazole (PROTONIX) 40 MG tablet TAKE 1 TABLET BY MOUTH ONCE DAILY. 01/14/16  Krebs, Amy Lauren, NP  tizanidine (ZANAFLEX) 2 MG capsule Take 1-2 capsules (2-4 mg total) by mouth 3 (three) times daily as needed for muscle spasms. 07/16/16   Karamalegos, Netta Neat, DO    No Known Allergies  Family History  Problem Relation Age of Onset  . Cancer Mother   . Heart disease Father   . Diabetes Father   . Hypertension Father     Social History Social History  Substance Use Topics  . Smoking status: Former  Smoker    Packs/day: 0.20    Years: 3.00    Types: Cigarettes  . Smokeless tobacco: Never Used  . Alcohol use 0.0 oz/week    Review of Systems Constitutional: Negative for fever.Positive for lightheadedness, now resolved Cardiovascular: Negative for chest pain. Respiratory: Mild shortness of breath, now resolved Gastrointestinal: Negative for abdominal pain.  Nausea. Genitourinary: Negative for dysuria. Musculoskeletal: Positive for diffuse muscle cramps in legs or arms chest and abdomen, now largely resolved. Neurological: Negative for . Denies focal weakness or numbness. States generalized weakness. All other ROS negative  ____________________________________________   PHYSICAL EXAM:  VITAL SIGNS: ED Triage Vitals  Enc Vitals Group     BP 11/26/16 1430 (!) 105/59     Pulse Rate 11/26/16 1430 89     Resp 11/26/16 1430 14     Temp --      Temp src --      SpO2 11/26/16 1430 97 %     Weight 11/26/16 1406 242 lb (109.8 kg)     Height 11/26/16 1406 5\' 11"  (1.803 m)     Head Circumference --      Peak Flow --      Pain Score 11/26/16 1406 5     Pain Loc --      Pain Edu? --      Excl. in GC? --     Constitutional: Alert and oriented. Well appearing and in no distress. Eyes: Normal exam ENT   Head: Normocephalic and atraumatic   Mouth/Throat: Mucous membranes are moist. Cardiovascular: Normal rate, regular rhythm. No murmur Respiratory: Normal respiratory effort without tachypnea nor retractions. Breath sounds are clear Gastrointestinal: Soft and nontender. No distention.  Musculoskeletal: Nontender with normal range of motion in all extremities. No lower extremity tenderness or edema. Neurologic:  Normal speech and language. No gross focal neurologic deficits  Skin:  Skin is warm, dry and intact.  Psychiatric: Mood and affect are normal.   ____________________________________________    EKG  EKG reviewed and interpreted by myself shows normal sinus rhythm  at 89 bpm, narrow QRS, normal axis, normal intervals besides a slightly prolonged QTC 515 ms, no concerning ST changes.  ____________________________________________    RADIOLOGY  Chest x-ray negative  ____________________________________________   INITIAL IMPRESSION / ASSESSMENT AND PLAN / ED COURSE  Pertinent labs & imaging results that were available during my care of the patient were reviewed by me and considered in my medical decision making (see chart for details).  The patient presents to the emergency department for generalized weakness, nausea and muscle cramps since yesterday. Patient states he works in the heat but tries to stay hydrated. Patient's symptoms are very suggestive of dehydration. We will check labs including creatinine kinase as well as troponin. We'll obtain an EKG. We'll IV hydrate and closely monitor in the emergency department. Report states initial blood pressure of 86 systolic in urgent care, blood pressure currently 111/67 in the emergency department.   The patient's labs have resulted  showing a creatinine elevation 2.65 from a baseline of 0.8. Patient's CK has resulted elevated at 562. Labs are very suspicious for significant dehydration. Patient received approximately 500 cc of normal saline between the urgent care and ER evaluation, his creatinine decreased from 2.86 to 2.65. We will continue with IV hydration.  Chest the patient's labs with the patient and offered to admit to the hospital. Patient states he strongly wishes to go home if possible. His CK is decreased from 500s to 420 with 500 cc of fluid. His creatinine has decreased from 2.8 at 2.6. We'll infuse a total of 3 L (including EMS fluids) and recheck a BMP. If the kidney function is trending towards normal I believe it would be safe to discharge home with continued oral hydration and follow up with his primary care doctor in 2 days for recheck of his kidney function. The patient is agreeable to this  plan.  Patient's repeat chemistry creatinine is 1.77. I again discussed this with the patient he wishes to be discharged home. I discussed continued oral hydration and follow up with his primary care doctor on Friday to have his labs rechecked. Patient agreeable to plan.  ____________________________________________   FINAL CLINICAL IMPRESSION(S) / ED DIAGNOSES  Generalized weakness Dehydration Muscle cramps Acute renal insufficiency   Minna Antis, MD 11/26/16 1941

## 2016-11-26 NOTE — ED Triage Notes (Signed)
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 Pt sat down and began having abd and leg cramps. Now c/o weakness, dizziness, and dyspnea.

## 2016-12-01 ENCOUNTER — Other Ambulatory Visit: Payer: Self-pay | Admitting: Family Medicine

## 2016-12-01 DIAGNOSIS — F419 Anxiety disorder, unspecified: Secondary | ICD-10-CM

## 2016-12-01 DIAGNOSIS — M5441 Lumbago with sciatica, right side: Secondary | ICD-10-CM

## 2016-12-01 DIAGNOSIS — K219 Gastro-esophageal reflux disease without esophagitis: Secondary | ICD-10-CM

## 2016-12-01 DIAGNOSIS — G8929 Other chronic pain: Secondary | ICD-10-CM

## 2016-12-01 MED ORDER — PANTOPRAZOLE SODIUM 40 MG PO TBEC: 40.0000 mg | DELAYED_RELEASE_TABLET | Freq: Every day | ORAL | 5 refills | Status: DC

## 2016-12-01 MED ORDER — FLUOXETINE HCL 20 MG PO CAPS: ORAL_CAPSULE | ORAL | 5 refills | Status: DC

## 2016-12-01 NOTE — Telephone Encounter (Signed)
The pt requesting refills for these medications, but you never managed these medications for him. Please advise

## 2016-12-01 NOTE — Telephone Encounter (Signed)
Pt asked for refills on fluoxetine and pantoprazole sent to [facility].  Pt was notified by pharmacy that he needed an appt but was sure he didn't need to be seen for refills.  His call back number is [phone]

## 2016-12-01 NOTE — Telephone Encounter (Signed)
He was previously managed by Dean PippinAmy Krebs NP, I can refill these for 6 month for now and follow-up at future visit.  Dean PilarAlexander Karamalegos, DO [facility] 12/01/2016, 6:26 PM

## 2017-03-23 ENCOUNTER — Other Ambulatory Visit: Payer: Self-pay | Admitting: Family Medicine

## 2017-03-23 DIAGNOSIS — M545 Low back pain, unspecified: Secondary | ICD-10-CM

## 2017-03-23 MED ORDER — KETOROLAC TROMETHAMINE 60 MG/2ML IM SOLN: INTRAMUSCULAR | 1 refills | Status: DC

## 2017-03-23 NOTE — Progress Notes (Signed)
Patient called, requesting refill Toradol, see prior notes, last rx 11/07/16, I have agreed to rx this intermittently, however still have requested that patient follow-up with Orthopedics given the extent of his NSAID use to proceed with further work-up imaging and other options.  Saralyn Pilar, DO [facility] 03/23/2017, 10:14 AM

## 2017-03-25 ENCOUNTER — Other Ambulatory Visit: Payer: Self-pay

## 2017-03-25 DIAGNOSIS — M545 Low back pain, unspecified: Secondary | ICD-10-CM

## 2017-08-07 ENCOUNTER — Other Ambulatory Visit: Payer: Self-pay | Admitting: Family Medicine

## 2017-08-07 DIAGNOSIS — K219 Gastro-esophageal reflux disease without esophagitis: Secondary | ICD-10-CM

## 2017-08-07 DIAGNOSIS — I10 Essential (primary) hypertension: Secondary | ICD-10-CM

## 2017-09-11 ENCOUNTER — Other Ambulatory Visit: Payer: Self-pay | Admitting: Family Medicine

## 2017-09-11 DIAGNOSIS — I10 Essential (primary) hypertension: Secondary | ICD-10-CM

## 2017-10-29 ENCOUNTER — Other Ambulatory Visit: Payer: Self-pay | Admitting: Family Medicine

## 2017-10-29 DIAGNOSIS — F419 Anxiety disorder, unspecified: Secondary | ICD-10-CM

## 2017-10-29 DIAGNOSIS — E119 Type 2 diabetes mellitus without complications: Secondary | ICD-10-CM

## 2017-10-29 DIAGNOSIS — I10 Essential (primary) hypertension: Secondary | ICD-10-CM

## 2017-11-01 ENCOUNTER — Other Ambulatory Visit: Payer: Self-pay | Admitting: Family Medicine

## 2017-11-01 DIAGNOSIS — F419 Anxiety disorder, unspecified: Secondary | ICD-10-CM

## 2017-11-03 ENCOUNTER — Other Ambulatory Visit: Payer: Self-pay | Admitting: Family Medicine

## 2017-11-03 DIAGNOSIS — F419 Anxiety disorder, unspecified: Secondary | ICD-10-CM

## 2017-11-17 ENCOUNTER — Encounter: Payer: Self-pay | Admitting: Family Medicine

## 2017-11-17 ENCOUNTER — Other Ambulatory Visit: Payer: Self-pay | Admitting: Family Medicine

## 2017-11-17 ENCOUNTER — Ambulatory Visit (INDEPENDENT_AMBULATORY_CARE_PROVIDER_SITE_OTHER): Payer: PRIVATE HEALTH INSURANCE | Admitting: Family Medicine

## 2017-11-17 VITALS — BP 139/81 | HR 72 | Temp 98.5°F | Resp 16 | Ht 71.0 in | Wt 239.6 lb

## 2017-11-17 DIAGNOSIS — M5441 Lumbago with sciatica, right side: Secondary | ICD-10-CM | POA: Diagnosis not present

## 2017-11-17 DIAGNOSIS — Z125 Encounter for screening for malignant neoplasm of prostate: Secondary | ICD-10-CM

## 2017-11-17 DIAGNOSIS — E785 Hyperlipidemia, unspecified: Secondary | ICD-10-CM

## 2017-11-17 DIAGNOSIS — E1169 Type 2 diabetes mellitus with other specified complication: Secondary | ICD-10-CM | POA: Diagnosis not present

## 2017-11-17 DIAGNOSIS — S80812A Abrasion, left lower leg, initial encounter: Secondary | ICD-10-CM

## 2017-11-17 DIAGNOSIS — Z0000 Encounter for general adult medical examination without abnormal findings: Secondary | ICD-10-CM

## 2017-11-17 DIAGNOSIS — I10 Essential (primary) hypertension: Secondary | ICD-10-CM

## 2017-11-17 DIAGNOSIS — G8929 Other chronic pain: Secondary | ICD-10-CM

## 2017-11-17 DIAGNOSIS — E66811 Obesity, class 1: Secondary | ICD-10-CM

## 2017-11-17 DIAGNOSIS — F419 Anxiety disorder, unspecified: Secondary | ICD-10-CM | POA: Diagnosis not present

## 2017-11-17 DIAGNOSIS — E669 Obesity, unspecified: Secondary | ICD-10-CM

## 2017-11-17 DIAGNOSIS — G4700 Insomnia, unspecified: Secondary | ICD-10-CM

## 2017-11-17 DIAGNOSIS — K219 Gastro-esophageal reflux disease without esophagitis: Secondary | ICD-10-CM

## 2017-11-17 DIAGNOSIS — Z23 Encounter for immunization: Secondary | ICD-10-CM

## 2017-11-17 MED ORDER — MUPIROCIN 2 % EX OINT
1.0000 | TOPICAL_OINTMENT | Freq: Two times a day (BID) | CUTANEOUS | 1 refills | Status: DC | PRN
Start: 2017-11-17 — End: 2018-03-04

## 2017-11-17 MED ORDER — FLUOXETINE HCL 20 MG PO CAPS: 20.0000 mg | ORAL_CAPSULE | Freq: Two times a day (BID) | ORAL | 3 refills | Status: DC

## 2017-11-17 MED ORDER — METFORMIN HCL 500 MG PO TABS: 500.0000 mg | ORAL_TABLET | Freq: Two times a day (BID) | ORAL | 3 refills | Status: DC

## 2017-11-17 MED ORDER — PANTOPRAZOLE SODIUM 40 MG PO TBEC: 40.0000 mg | DELAYED_RELEASE_TABLET | Freq: Every day | ORAL | 11 refills | Status: DC

## 2017-11-17 MED ORDER — LISINOPRIL 10 MG PO TABS: 10.0000 mg | ORAL_TABLET | Freq: Every day | ORAL | 3 refills | Status: DC

## 2017-11-17 NOTE — Assessment & Plan Note (Signed)
Weight down 2-3 lbs in 1 year Encourage keep improving lifestyle, labs in 3 months

## 2017-11-17 NOTE — Assessment & Plan Note (Signed)
Previously controlled DM, without reading >1 year, uncertain current control No known hypoglycemia Complications - Other including hyperlipidemia - increases risk of future cardiovascular complications and also GERD  Plan:  1. Continue current therapy - Metformin 500mg  BID, refilled 2. Encourage improved lifestyle - low carb, low sugar diet, reduce portion size, continue improving regular exercise 3. Check CBG , bring log to next visit for review 4. Continue ACEi,  5. DM Foot exam done today / Advised to schedule DM ophtho exam, send record 6. Follow-up 3 months Annual Phys + Labs A1c

## 2017-11-17 NOTE — Assessment & Plan Note (Signed)
Currently stable without flare Not focus of visit Updated med list May take NSAID - due for labs upcoming to check Cr function, concern may have increased risk of CKD with history of DM, HTN and prior NSAID use

## 2017-11-17 NOTE — Assessment & Plan Note (Signed)
Controlled on PPI Refill protonix 40mg  daily

## 2017-11-17 NOTE — Progress Notes (Signed)
Subjective:    Patient ID: Dean Clements, male    DOB: 1966/02/21, 52 y.o.   MRN: [id number]  Alwin Lanigan is a 52 y.o. male presenting on 11/17/2017 for Hypertension  Last appointment in 07/2016, patient has not followed up as advised. He is now due for refills and resuming care.  HPI   CHRONIC HTN: Reports due for refill. Not checking BP regularly Current Meds - Lisinopril 10mg  daily   Denies CP, dyspnea, HA, edema, dizziness / lightheadedness  CHRONIC DM, Type 2: Reports concerns with due for sugar check. Needs refills. Meds: Metformin 500mg  BID Currently on ACEi Lifestyle: - Diet (balanced diet, tries to limit some sugars/carbs)  - Exercise (active, currently without back pain) Due for DM eye exam, he will check insurance and locate provider Denies hypoglycemia, polyuria, visual changes, numbness or tingling.  Anxiety, chronic Past history of anxiety, controlled on SSRI Fluoxetine, he is requesting refill, ran out 1-2 weeks ago, was taking 20mg  twice daily Admits some tremors or unsteady after holding med Denies panic attack or worsening anxiety  Chronic Back pain He takes Aleve OTC x 2 tablets twice daily most days with good results Off muscle relaxants robaxin, tizanidine No longer needing Toradol injections, he has one just in case he has flare up No recent flare up  Health Maintenance: Due for pneumonia vaccine -pneumovax23 1st dose as diabetic < age 15 - will get today Due TDap - will get today  Considering Cologuard, will check cost and follow-up   Depression screen [facility] 2/9 11/17/2017 01/15/2015  Decreased Interest 0 0  Down, Depressed, Hopeless 0 0  PHQ - 2 Score 0 0  Altered sleeping 3 -  Tired, decreased energy 0 -  Change in appetite 0 -  Feeling bad or failure about yourself  0 -  Trouble concentrating 0 -  Moving slowly or fidgety/restless 0 -  Suicidal thoughts 0 -  PHQ-9 Score 3 -  Difficult doing work/chores Somewhat  difficult -   GAD 7 : Generalized Anxiety Score 11/17/2017  Nervous, Anxious, on Edge 2  Control/stop worrying 0  Worry too much - different things 0  Trouble relaxing 0  Restless 0  Easily annoyed or irritable 1  Afraid - awful might happen 0  Total GAD 7 Score 3  Anxiety Difficulty Somewhat difficult    Social History   Tobacco Use  . Smoking status: Former Smoker    Packs/day: 0.20    Years: 3.00    Pack years: 0.60    Types: Cigarettes  . Smokeless tobacco: Never Used  Substance Use Topics  . Alcohol use: Yes    Alcohol/week: 0.0 oz  . Drug use: No    Review of Systems Per HPI unless specifically indicated above     Objective:    BP 139/81   Pulse 72   Temp 98.5 F (36.9 C) (Oral)   Resp 16   Ht 5\' 11"  (1.803 m)   Wt 239 lb 9.6 oz (108.7 kg)   BMI 33.42 kg/m   Wt Readings from Last 3 Encounters:  11/17/17 239 lb 9.6 oz (108.7 kg)  11/26/16 242 lb (109.8 kg)  11/26/16 242 lb (109.8 kg)    Physical Exam  Constitutional: He is oriented to person, place, and time. He appears well-developed and well-nourished. No distress.  Well-appearing, comfortable, cooperative  HENT:  Head: Normocephalic and atraumatic.  Mouth/Throat: Oropharynx is clear and moist.  Eyes: Conjunctivae are normal. Right eye exhibits no discharge.  Left eye exhibits no discharge.  Neck: Normal range of motion. Neck supple. No thyromegaly present.  Cardiovascular: Normal rate, regular rhythm, normal heart sounds and intact distal pulses.  No murmur heard. Pulmonary/Chest: Effort normal and breath sounds normal. No respiratory distress. He has no wheezes. He has no rales.  Musculoskeletal: Normal range of motion. He exhibits no edema.  No current pain actively in back  Lymphadenopathy:    He has no cervical adenopathy.  Neurological: He is alert and oriented to person, place, and time.  Skin: Skin is warm and dry. No rash noted. He is not diaphoretic. No erythema.  Bilateral lower  extremity mild healing superficial abrasion now scabbed < 1 cm without significant erythema  Psychiatric: He has a normal mood and affect. His behavior is normal.  Well groomed, good eye contact, normal speech and thoughts  Nursing note and vitals reviewed.  Results for orders placed or performed during the hospital encounter of 11/26/16  Basic metabolic panel  Result Value Ref Range   Sodium 135 135 - 145 mmol/L   Potassium 3.4 (L) 3.5 - 5.1 mmol/L   Chloride 102 101 - 111 mmol/L   CO2 22 22 - 32 mmol/L   Glucose, Bld 170 (H) 65 - 99 mg/dL   BUN 30 (H) 6 - 20 mg/dL   Creatinine, Ser 1.61 (H) 0.61 - 1.24 mg/dL   Calcium 8.6 (L) 8.9 - 10.3 mg/dL   GFR calc non Af Amer 26 (L) >60 mL/min   GFR calc Af Amer 30 (L) >60 mL/min   Anion gap 11 5 - 15  CBC  Result Value Ref Range   WBC 12.8 (H) 3.8 - 10.6 K/uL   RBC 4.72 4.40 - 5.90 MIL/uL   Hemoglobin 15.1 13.0 - 18.0 g/dL   HCT 09.6 04.5 - 40.9 %   MCV 91.9 80.0 - 100.0 fL   MCH 32.0 26.0 - 34.0 pg   MCHC 34.8 32.0 - 36.0 g/dL   RDW 81.1 91.4 - 78.2 %   Platelets 190 150 - 440 K/uL  Troponin I  Result Value Ref Range   Troponin I <0.03 <0.03 ng/mL  CK  Result Value Ref Range   Total CK 420 (H) 49 - 397 U/L  Basic metabolic panel  Result Value Ref Range   Sodium 136 135 - 145 mmol/L   Potassium 3.8 3.5 - 5.1 mmol/L   Chloride 106 101 - 111 mmol/L   CO2 23 22 - 32 mmol/L   Glucose, Bld 125 (H) 65 - 99 mg/dL   BUN 27 (H) 6 - 20 mg/dL   Creatinine, Ser 9.56 (H) 0.61 - 1.24 mg/dL   Calcium 7.9 (L) 8.9 - 10.3 mg/dL   GFR calc non Af Amer 43 (L) >60 mL/min   GFR calc Af Amer 50 (L) >60 mL/min   Anion gap 7 5 - 15      Assessment & Plan:   Problem List Items Addressed This Visit    Anxiety    Stable, controlled anxiety On SSRI Refill Fluoxetine 20mg  BID      Relevant Medications   FLUoxetine (PROZAC) 20 MG capsule   Chronic bilateral back pain    Currently stable without flare Not focus of visit Updated med list May  take NSAID - due for labs upcoming to check Cr function, concern may have increased risk of CKD with history of DM, HTN and prior NSAID use      Essential hypertension    Mildly  elevated initial BP. - Home BP readings none  History of elevated Creatinine, overdue for lab results to trend Cr   Plan:  1. Continue current BP regimen - Lisinopril 10mg  daily 2. Encourage improved lifestyle - low sodium diet, regular exercise 3. Start monitor BP outside office, bring readings to next visit, if persistently >140/90 or new symptoms notify office sooner 4. Follow-up 3 months for labs / annual      Relevant Medications   lisinopril (PRINIVIL,ZESTRIL) 10 MG tablet   GERD (gastroesophageal reflux disease)    Controlled on PPI Refill protonix 40mg  daily      Relevant Medications   pantoprazole (PROTONIX) 40 MG tablet   Obesity (BMI 30.0-34.9)    Weight down 2-3 lbs in 1 year Encourage keep improving lifestyle, labs in 3 months      Type 2 diabetes mellitus with other specified complication (HCC) - Primary    Previously controlled DM, without reading >1 year, uncertain current control No known hypoglycemia Complications - Other including hyperlipidemia - increases risk of future cardiovascular complications and also GERD  Plan:  1. Continue current therapy - Metformin 500mg  BID, refilled 2. Encourage improved lifestyle - low carb, low sugar diet, reduce portion size, continue improving regular exercise 3. Check CBG , bring log to next visit for review 4. Continue ACEi,  5. DM Foot exam done today / Advised to schedule DM ophtho exam, send record 6. Follow-up 3 months Annual Phys + Labs A1c      Relevant Medications   lisinopril (PRINIVIL,ZESTRIL) 10 MG tablet   metFORMIN (GLUCOPHAGE) 500 MG tablet   Other Relevant Orders   Pneumococcal polysaccharide vaccine 23-valent greater than or equal to 2yo subcutaneous/IM (Completed)    Other Visit Diagnoses    Abrasion of left lower  extremity, initial encounter       Relevant Medications   mupirocin ointment (BACTROBAN) 2 %   Need for diphtheria-tetanus-pertussis (Tdap) vaccine       Relevant Orders   Tdap vaccine greater than or equal to 7yo IM (Completed)   Need for 23-polyvalent pneumococcal polysaccharide vaccine       Relevant Orders   Pneumococcal polysaccharide vaccine 23-valent greater than or equal to 2yo subcutaneous/IM (Completed)      Meds ordered this encounter  Medications  . mupirocin ointment (BACTROBAN) 2 %    Sig: Apply 1 application topically 2 (two) times daily as needed. For minor skin abrasions, up to 7-10 days    Dispense:  22 g    Refill:  1  . FLUoxetine (PROZAC) 20 MG capsule    Sig: Take 1 capsule (20 mg total) by mouth 2 (two) times daily.    Dispense:  180 capsule    Refill:  3  . lisinopril (PRINIVIL,ZESTRIL) 10 MG tablet    Sig: Take 1 tablet (10 mg total) by mouth daily.    Dispense:  90 tablet    Refill:  3  . metFORMIN (GLUCOPHAGE) 500 MG tablet    Sig: Take 1 tablet (500 mg total) by mouth 2 (two) times daily with a meal.    Dispense:  180 tablet    Refill:  3  . pantoprazole (PROTONIX) 40 MG tablet    Sig: Take 1 tablet (40 mg total) by mouth daily.    Dispense:  30 tablet    Refill:  11    Follow up plan: Return in about 3 months (around 02/17/2018) for Annual Physical.  Future labs ordered for 3 months 02/2018  Saralyn Pilar, DO [facility] 11/17/2017, 1:18 PM

## 2017-11-17 NOTE — Patient Instructions (Addendum)
Thank you for coming to the office today.  Recommend scheduling an Annual Diabetic Eye Exam - any optometry or ophthalmology - check with insurance coverage - Need a faxed copy of your Diabetic Eye Report  Refilled all meds  Pneumonia-vaccine 23 today - good until age 52  TDap tetanus today good for 10 years  Rx antibiotic ointment if need for abrasion on extremities - if minor can just use vaseline  Colon Cancer Screening: - For all adults age 52+ routine colon cancer screening is highly recommended.     - Recent guidelines from [place] recommend starting age of 86 - Early detection of colon cancer is important, because often there are no warning signs or symptoms, also if found early usually it can be cured. Late stage is hard to treat.  - If you are not interested in Colonoscopy screening (if done and normal you could be cleared for 5 to 10 years until next due), then Cologuard is an excellent alternative for screening test for Colon Cancer. It is highly sensitive for detecting DNA of colon cancer from even the earliest stages. Also, there is NO bowel prep required. - If Cologuard is NEGATIVE, then it is good for 3 years before next due - If Cologuard is POSITIVE, then it is strongly advised to get a Colonoscopy, which allows the GI doctor to locate the source of the cancer or polyp (even very early stage) and treat it by removing it. ------------------------- If you would like to proceed with Cologuard (stool DNA test) - FIRST, call your insurance company and tell them you want to check cost of Cologuard tell them CPT Code [id number] (it may be completely covered and you could get for no cost, OR max cost without any coverage is about $600). Also, keep in mind if you do NOT open the kit, and decide not to do the test, you will NOT be charged, you should contact the company if you decide not to do the test. - If you want to proceed, you can notify us (phone message, [place], or at next visit) and we will order it for you. The test kit will be delivered to you house within about 1 week. Follow instructions to collect sample, you may call the company for any help or questions, 24/7 telephone support at [phone].  DUE for FASTING BLOOD WORK (no food or drink after midnight before the lab appointment, only water or coffee without cream/sugar on the morning of)  SCHEDULE "Lab Only" visit in the morning at the clinic for lab draw in 3 MONTHS   - Make sure Lab Only appointment is at about 1 week before your next appointment, so that results will be available  For Lab Results, once available within 2-3 days of blood draw, you can can log in to MyChart online to view your results and a brief explanation. Also, we can discuss results at next follow-up visit.   Please schedule a Follow-up Appointment to: Return in about 3 months (around 02/17/2018) for Annual Physical.  If you have any other questions or concerns, please feel free to call the office or send a message through [place]. You may also schedule an earlier appointment if necessary.  Additionally, you may be receiving a survey about your experience at our office within a few days to 1 week by e-mail or mail. We value your feedback.  Nobie Putnam, DO [place]

## 2017-11-17 NOTE — Assessment & Plan Note (Signed)
Mildly elevated initial BP. - Home BP readings none  History of elevated Creatinine, overdue for lab results to trend Cr   Plan:  1. Continue current BP regimen - Lisinopril 10mg  daily 2. Encourage improved lifestyle - low sodium diet, regular exercise 3. Start monitor BP outside office, bring readings to next visit, if persistently >140/90 or new symptoms notify office sooner 4. Follow-up 3 months for labs / annual

## 2017-11-17 NOTE — Assessment & Plan Note (Signed)
Stable, controlled anxiety On SSRI Refill Fluoxetine 20mg  BID

## 2018-01-15 ENCOUNTER — Other Ambulatory Visit: Payer: Self-pay | Admitting: Family Medicine

## 2018-01-15 ENCOUNTER — Telehealth: Payer: Self-pay | Admitting: Family Medicine

## 2018-01-15 DIAGNOSIS — M545 Low back pain, unspecified: Secondary | ICD-10-CM

## 2018-01-15 NOTE — Telephone Encounter (Signed)
Patient requested refill Toradol injection.  Reviewed chart. He has been using these and steroid injection in past with relief. Last time I saw him in 11/2017 we discussed his back pain and it was improved, also reviewed kidney function and my concerns.  I called him today to confirm he still needed this request. He had a back pain flare and would like to repeat the toradol injection usually it helps.  I advised him that he needs to schedule for fasting lab only and annual physical soon in 02/2018, labs are already ordered.  I am concerned with potential side effects and risks of frequent toradol injections and also prednisone use in past. He stated that I prescribed him steroid injection before. Prednisone pills do not work for him. I advised him that I did not rx a steroid injection and I routinely do not do that, he thinks may be mistaken could have been other provider here in past.  New rx refilled toradol injection, and advised no more rx or refills until he has blood work drawn.  Saralyn PilarAlexander Lakashia Collison, DO [facility] 01/15/2018, 5:47 PM

## 2018-02-09 ENCOUNTER — Emergency Department
Admission: EM | Admit: 2018-02-09 | Discharge: 2018-02-09 | Disposition: A | Discharge status: Home / Self Care | Payer: PRIVATE HEALTH INSURANCE | Attending: Emergency Medicine | Admitting: Emergency Medicine

## 2018-02-09 ENCOUNTER — Encounter: Payer: Self-pay | Admitting: Emergency Medicine

## 2018-02-09 DIAGNOSIS — E860 Dehydration: Secondary | ICD-10-CM

## 2018-02-09 DIAGNOSIS — Z7984 Long term (current) use of oral hypoglycemic drugs: Secondary | ICD-10-CM | POA: Diagnosis not present

## 2018-02-09 DIAGNOSIS — F10930 Alcohol use, unspecified with withdrawal, uncomplicated: Secondary | ICD-10-CM

## 2018-02-09 DIAGNOSIS — Z79899 Other long term (current) drug therapy: Secondary | ICD-10-CM | POA: Insufficient documentation

## 2018-02-09 DIAGNOSIS — F10230 Alcohol dependence with withdrawal, uncomplicated: Secondary | ICD-10-CM

## 2018-02-09 DIAGNOSIS — I10 Essential (primary) hypertension: Secondary | ICD-10-CM | POA: Diagnosis not present

## 2018-02-09 DIAGNOSIS — Z87891 Personal history of nicotine dependence: Secondary | ICD-10-CM | POA: Diagnosis not present

## 2018-02-09 DIAGNOSIS — R112 Nausea with vomiting, unspecified: Secondary | ICD-10-CM | POA: Diagnosis not present

## 2018-02-09 DIAGNOSIS — E119 Type 2 diabetes mellitus without complications: Secondary | ICD-10-CM | POA: Insufficient documentation

## 2018-02-09 DIAGNOSIS — F1010 Alcohol abuse, uncomplicated: Secondary | ICD-10-CM

## 2018-02-09 DIAGNOSIS — R197 Diarrhea, unspecified: Secondary | ICD-10-CM | POA: Diagnosis present

## 2018-02-09 LAB — CBC
HCT: 51.6 % (ref 40.0–52.0)
Hemoglobin: 18.3 g/dL — ABNORMAL HIGH (ref 13.0–18.0)
MCH: 34.4 pg — ABNORMAL HIGH (ref 26.0–34.0)
MCHC: 35.4 g/dL (ref 32.0–36.0)
MCV: 97.1 fL (ref 80.0–100.0)
Platelets: 201 10*3/uL (ref 150–440)
RBC: 5.32 MIL/uL (ref 4.40–5.90)
RDW: 14.1 % (ref 11.5–14.5)
WBC: 8.3 10*3/uL (ref 3.8–10.6)

## 2018-02-09 LAB — COMPREHENSIVE METABOLIC PANEL
ALT: 86 U/L — ABNORMAL HIGH (ref 0–44)
AST: 82 U/L — ABNORMAL HIGH (ref 15–41)
Albumin: 4.7 g/dL (ref 3.5–5.0)
Alkaline Phosphatase: 85 U/L (ref 38–126)
Anion gap: 13 (ref 5–15)
BUN: 9 mg/dL (ref 6–20)
CO2: 21 mmol/L — ABNORMAL LOW (ref 22–32)
Calcium: 9.4 mg/dL (ref 8.9–10.3)
Chloride: 102 mmol/L (ref 98–111)
Creatinine, Ser: 0.89 mg/dL (ref 0.61–1.24)
GFR calc Af Amer: >60 mL/min (ref >60)
GFR calc non Af Amer: >60 mL/min (ref >60)
Glucose, Bld: 224 mg/dL — ABNORMAL HIGH (ref 70–99)
Potassium: 2.9 mmol/L — ABNORMAL LOW (ref 3.5–5.1)
Sodium: 136 mmol/L (ref 135–145)
Total Bilirubin: 2.9 mg/dL — ABNORMAL HIGH (ref 0.3–1.2)
Total Protein: 8.8 g/dL — ABNORMAL HIGH (ref 6.5–8.1)

## 2018-02-09 LAB — LIPASE, BLOOD: Lipase: 54 U/L — ABNORMAL HIGH (ref 11–51)

## 2018-02-09 MED ORDER — ONDANSETRON HCL 4 MG/2ML IJ SOLN
4.0000 mg | Freq: Once | INTRAMUSCULAR | Status: AC
  Administered 2018-02-09: 4 mg via INTRAVENOUS
  Filled 2018-02-09: qty 2

## 2018-02-09 MED ORDER — LOPERAMIDE HCL 2 MG PO CAPS
2.0000 mg | ORAL_CAPSULE | Freq: Once | ORAL | Status: AC
  Administered 2018-02-09: 2 mg via ORAL
  Filled 2018-02-09: qty 1

## 2018-02-09 MED ORDER — CHLORDIAZEPOXIDE HCL 25 MG PO CAPS
50.0000 mg | ORAL_CAPSULE | Freq: Once | ORAL | Status: AC
  Administered 2018-02-09: 50 mg via ORAL

## 2018-02-09 MED ORDER — CHLORDIAZEPOXIDE HCL 25 MG PO CAPS: 25.0000 mg | ORAL_CAPSULE | Freq: Three times a day (TID) | ORAL | 0 refills | Status: DC | PRN

## 2018-02-09 MED ORDER — SODIUM CHLORIDE 0.9 % IV BOLUS
2000.0000 mL | Freq: Once | INTRAVENOUS | Status: AC
  Administered 2018-02-09: 2000 mL via INTRAVENOUS

## 2018-02-09 MED ORDER — CHLORDIAZEPOXIDE HCL 25 MG PO CAPS
ORAL_CAPSULE | ORAL | Status: AC
  Filled 2018-02-09: qty 2

## 2018-02-09 MED ORDER — ONDANSETRON 4 MG PO TBDP: 4.0000 mg | ORAL_TABLET | Freq: Three times a day (TID) | ORAL | 0 refills | Status: DC | PRN

## 2018-02-09 NOTE — BH Assessment (Signed)
Patient was discharged prior to TTS seeing him.

## 2018-02-09 NOTE — Discharge Instructions (Signed)
Please make sure you remain well-hydrated with lots of small sips of fluids throughout the day.  It can be dangerous to stop drinking alcohol all of a sudden so she begin to feel anxious or tremulous please take Librium as prescribed up to 3 times a day to help with your symptoms.  Return to the emergency department for any concerns whatsoever.  It was a pleasure to take care of you today, and thank you for coming to our emergency department.  If you have any questions or concerns before leaving please ask the nurse to grab me and I'm more than happy to go through your aftercare instructions again.  If you were prescribed any opioid pain medication today such as Norco, Vicodin, Percocet, morphine, hydrocodone, or oxycodone please make sure you do not drive when you are taking this medication as it can alter your ability to drive safely.  If you have any concerns once you are home that you are not improving or are in fact getting worse before you can make it to your follow-up appointment, please do not hesitate to call 911 and come back for further evaluation.  Merrily Brittle, MD  Results for orders placed or performed during the hospital encounter of 02/09/18  Lipase, blood  Result Value Ref Range   Lipase 54 (H) 11 - 51 U/L  Comprehensive metabolic panel  Result Value Ref Range   Sodium 136 135 - 145 mmol/L   Potassium 2.9 (L) 3.5 - 5.1 mmol/L   Chloride 102 98 - 111 mmol/L   CO2 21 (L) 22 - 32 mmol/L   Glucose, Bld 224 (H) 70 - 99 mg/dL   BUN 9 6 - 20 mg/dL   Creatinine, Ser 8.59 0.61 - 1.24 mg/dL   Calcium 9.4 8.9 - 29.2 mg/dL   Total Protein 8.8 (H) 6.5 - 8.1 g/dL   Albumin 4.7 3.5 - 5.0 g/dL   AST 82 (H) 15 - 41 U/L   ALT 86 (H) 0 - 44 U/L   Alkaline Phosphatase 85 38 - 126 U/L   Total Bilirubin 2.9 (H) 0.3 - 1.2 mg/dL   GFR calc non Af Amer >60 >60 mL/min   GFR calc Af Amer >60 >60 mL/min   Anion gap 13 5 - 15  CBC  Result Value Ref Range   WBC 8.3 3.8 - 10.6 K/uL   RBC 5.32  4.40 - 5.90 MIL/uL   Hemoglobin 18.3 (H) 13.0 - 18.0 g/dL   HCT 44.6 28.6 - 38.1 %   MCV 97.1 80.0 - 100.0 fL   MCH 34.4 (H) 26.0 - 34.0 pg   MCHC 35.4 32.0 - 36.0 g/dL   RDW 77.1 16.5 - 79.0 %   Platelets 201 150 - 440 K/uL

## 2018-02-09 NOTE — ED Triage Notes (Signed)
Patient states, "I think I drank too much this weekend and I think I"m dehydrated.  I've been working outside in the yard and then [employer] been throwing up and having diarrhea.  This is how I felt before when I was dehydrated.  I think I just really need some fluids."  Patient reports vomiting x 1 in 24 hours and diarrhea x 5.

## 2018-02-09 NOTE — ED Provider Notes (Signed)
[facility] Emergency Department Provider Note  ____________________________________________   First MD Initiated Contact with Patient 02/09/18 1545     (approximate)  I have reviewed the triage vital signs and the nursing notes.   HISTORY  Chief Complaint Diarrhea and Anxiety   HPI Dean Clements is a 52 y.o. male who comes to the emergency department requesting IV fluids for dehydration.  The patient is an alcoholic and over the weekend he said he drank a large amount of vodka while outside mowing the lawn.  He is had multiple episodes of emesis and diarrhea ever since.  No recent antibiotics.  No recent travel.  He says that he wants to stop drinking alcohol.  His symptoms came on suddenly are now constant nothing seems to make it better or worse.  Last drink was this morning.  He does get "the shakes" when he does not drink alcohol.    Past Medical History:  Diagnosis Date  . Hyperlipidemia   . Low back pain 2004   occasional back flares each year.     Patient Active Problem List   Diagnosis Date Noted  . Chronic bilateral back pain 06/04/2016  . Type 2 diabetes mellitus with other specified complication (HCC) 09/10/2015  . Essential hypertension 04/03/2015  . Anxiety 04/03/2015  . GERD (gastroesophageal reflux disease) 01/15/2015  . Insomnia 01/15/2015  . Obesity (BMI 30.0-34.9) 01/15/2015    Past Surgical History:  Procedure Laterality Date  . none      Prior to Admission medications   Medication Sig Start Date End Date Taking? Authorizing Provider  FLUoxetine (PROZAC) 20 MG capsule Take 1 capsule (20 mg total) by mouth 2 (two) times daily. 11/17/17  Yes Karamalegos, Netta Neat, DO  lisinopril (PRINIVIL,ZESTRIL) 10 MG tablet Take 1 tablet (10 mg total) by mouth daily. 11/17/17  Yes Karamalegos, Netta Neat, DO  metFORMIN (GLUCOPHAGE) 500 MG tablet Take 1 tablet (500 mg total) by mouth 2 (two) times daily with a meal. 11/17/17  Yes  Karamalegos, Alexander J, DO  pantoprazole (PROTONIX) 40 MG tablet Take 1 tablet (40 mg total) by mouth daily. 11/17/17  Yes Karamalegos, Netta Neat, DO  chlordiazePOXIDE (LIBRIUM) 25 MG capsule Take 1 capsule (25 mg total) by mouth 3 (three) times daily as needed for anxiety or withdrawal. 02/09/18   Merrily Brittle, MD  ketorolac (TORADOL) 60 MG/2ML SOLN injection INJECT 2 MLS INTO MUSCLE AS NEEDED FOR SEVERE PAIN 01/15/18   Althea Charon, Netta Neat, DO  mupirocin ointment (BACTROBAN) 2 % Apply 1 application topically 2 (two) times daily as needed. For minor skin abrasions, up to 7-10 days 11/17/17   Smitty Cords, DO  naproxen sodium (ANAPROX) 220 MG tablet Take 220 mg by mouth 2 (two) times daily with a meal.    [provider]  ondansetron (ZOFRAN ODT) 4 MG disintegrating tablet Take 1 tablet (4 mg total) by mouth every 8 (eight) hours as needed for nausea or vomiting. 02/09/18   Merrily Brittle, MD    Allergies Patient has no known allergies.  Family History  Problem Relation Age of Onset  . Cancer Mother   . Heart disease Father   . Diabetes Father   . Hypertension Father     Social History Social History   Tobacco Use  . Smoking status: Former Smoker    Packs/day: 0.20    Years: 3.00    Pack years: 0.60    Types: Cigarettes  . Smokeless tobacco: Never Used  Substance  Use Topics  . Alcohol use: Yes    Alcohol/week: 0.0 standard drinks  . Drug use: No    Review of Systems Constitutional: No fever/chills Eyes: No visual changes. ENT: No sore throat. Cardiovascular: Denies chest pain. Respiratory: Denies shortness of breath. Gastrointestinal: Positive for abdominal pain.  Positive for nausea, positive for vomiting.  Positive for diarrhea.  No constipation. Genitourinary: Negative for dysuria. Musculoskeletal: Negative for back pain. Skin: Negative for rash. Neurological: Negative for headaches, focal weakness or  numbness.   ____________________________________________   PHYSICAL EXAM:  VITAL SIGNS: ED Triage Vitals  Enc Vitals Group     BP 02/09/18 1440 (!) 139/91     Pulse Rate 02/09/18 1440 (!) 119     Resp 02/09/18 1440 16     Temp 02/09/18 1440 98.4 F (36.9 C)     Temp Source 02/09/18 1440 Oral     SpO2 02/09/18 1440 97 %     Weight 02/09/18 1441 220 lb (99.8 kg)     Height 02/09/18 1441 5\' 11"  (1.803 m)     Head Circumference --      Peak Flow --      Pain Score 02/09/18 1441 8     Pain Loc --      Pain Edu? --      Excl. in GC? --     Constitutional: Alert and oriented x4 anxious appearing nontoxic no diaphoresis Eyes: PERRL EOMI. midrange and brisk Head: Atraumatic. Nose: No congestion/rhinnorhea. Mouth/Throat: No trismus mild tongue fasciculations Neck: No stridor.   Cardiovascular: Tachycardic rate, regular rhythm. Grossly normal heart sounds.  Good peripheral circulation. Respiratory: Slightly increased respiratory effort.  No retractions. Lungs CTAB and moving good air Gastrointestinal: Soft nontender Musculoskeletal: No lower extremity edema   Neurologic:  Normal speech and language. No gross focal neurologic deficits are appreciated. Skin:  Skin is warm, dry and intact. No rash noted. Psychiatric: Mildly anxious appearing    ____________________________________________   DIFFERENTIAL includes but not limited to  Dehydration, alcohol withdrawal, alcohol abuse ____________________________________________   LABS (all labs ordered are listed, but only abnormal results are displayed)  Labs Reviewed  LIPASE, BLOOD - Abnormal; Notable for the following components:      Result Value   Lipase 54 (*)    All other components within normal limits  COMPREHENSIVE METABOLIC PANEL - Abnormal; Notable for the following components:   Potassium 2.9 (*)    CO2 21 (*)    Glucose, Bld 224 (*)    Total Protein 8.8 (*)    AST 82 (*)    ALT 86 (*)    Total Bilirubin 2.9  (*)    All other components within normal limits  CBC - Abnormal; Notable for the following components:   Hemoglobin 18.3 (*)    MCH 34.4 (*)    All other components within normal limits    Lab work reviewed by me shows slightly low potassium likely secondary to vomiting.  Elevated hemoglobin likely secondary to hemoconcentration.  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   As part of my medical decision making,  I reviewed the following data within the electronic MEDICAL RECORD NUMBER History obtained from family if available, nursing notes, old chart and ekg, as well as notes from prior ED visits.  The patient is clearly dehydrated nauseated and having some diarrhea.  He is in mild alcohol withdrawal at this point.  Given oral chlordiazepoxide and 2 L of fluid with near complete resolution of his symptoms.  I offered to have him stay to speak to a TTS counselor however he declined stating he would like an outpatient referral.  I will give him a short taper of Librium however made him aware that is extremely dangerous to stop drinking suddenly on his own.  He feels welcome to return at any point.      ____________________________________________   FINAL CLINICAL IMPRESSION(S) / ED DIAGNOSES  Final diagnoses:  Diarrhea, unspecified type  Dehydration  Alcohol withdrawal syndrome without complication (HCC)  Alcohol abuse      NEW MEDICATIONS STARTED  DURING THIS VISIT:  Discharge Medication List as of 02/09/2018  3:53 PM    START taking these medications   Details  chlordiazePOXIDE (LIBRIUM) 25 MG capsule Take 1 capsule (25 mg total) by mouth 3 (three) times daily as needed for anxiety or withdrawal., Starting Tue 02/09/2018, Print    ondansetron (ZOFRAN ODT) 4 MG disintegrating tablet Take 1 tablet (4 mg total) by mouth every 8 (eight) hours as needed for nausea or vomiting., Starting Tue 02/09/2018, Print         Note:  This document was prepared using Dragon voice recognition software and may include unintentional dictation errors.     Merrily Brittle, MD 02/11/18 [phone]

## 2018-03-04 ENCOUNTER — Ambulatory Visit (INDEPENDENT_AMBULATORY_CARE_PROVIDER_SITE_OTHER): Payer: PRIVATE HEALTH INSURANCE | Admitting: Nurse Practitioner

## 2018-03-04 ENCOUNTER — Inpatient Hospital Stay
Admission: EM | Admit: 2018-03-04 | Discharge: 2018-03-06 | DRG: 684 | Disposition: A | Discharge status: Home / Self Care | Payer: PRIVATE HEALTH INSURANCE | Source: Ambulatory Visit | Attending: Internal Medicine | Admitting: Internal Medicine

## 2018-03-04 ENCOUNTER — Inpatient Hospital Stay: Payer: PRIVATE HEALTH INSURANCE

## 2018-03-04 ENCOUNTER — Encounter: Payer: Self-pay | Admitting: Medical Oncology

## 2018-03-04 ENCOUNTER — Encounter: Payer: Self-pay | Admitting: Nurse Practitioner

## 2018-03-04 ENCOUNTER — Other Ambulatory Visit: Payer: Self-pay

## 2018-03-04 VITALS — BP 72/50 | HR 91 | Temp 97.6°F | Ht 71.0 in | Wt 221.6 lb

## 2018-03-04 DIAGNOSIS — Z833 Family history of diabetes mellitus: Secondary | ICD-10-CM

## 2018-03-04 DIAGNOSIS — Z8249 Family history of ischemic heart disease and other diseases of the circulatory system: Secondary | ICD-10-CM

## 2018-03-04 DIAGNOSIS — N289 Disorder of kidney and ureter, unspecified: Secondary | ICD-10-CM | POA: Diagnosis not present

## 2018-03-04 DIAGNOSIS — H538 Other visual disturbances: Secondary | ICD-10-CM | POA: Diagnosis not present

## 2018-03-04 DIAGNOSIS — K219 Gastro-esophageal reflux disease without esophagitis: Secondary | ICD-10-CM | POA: Diagnosis present

## 2018-03-04 DIAGNOSIS — I959 Hypotension, unspecified: Secondary | ICD-10-CM | POA: Diagnosis present

## 2018-03-04 DIAGNOSIS — N179 Acute kidney failure, unspecified: Secondary | ICD-10-CM | POA: Diagnosis present

## 2018-03-04 DIAGNOSIS — Z7984 Long term (current) use of oral hypoglycemic drugs: Secondary | ICD-10-CM

## 2018-03-04 DIAGNOSIS — N170 Acute kidney failure with tubular necrosis: Principal | ICD-10-CM | POA: Diagnosis present

## 2018-03-04 DIAGNOSIS — R740 Nonspecific elevation of levels of transaminase and lactic acid dehydrogenase [LDH]: Secondary | ICD-10-CM | POA: Diagnosis present

## 2018-03-04 DIAGNOSIS — Z23 Encounter for immunization: Secondary | ICD-10-CM

## 2018-03-04 DIAGNOSIS — E119 Type 2 diabetes mellitus without complications: Secondary | ICD-10-CM | POA: Diagnosis present

## 2018-03-04 DIAGNOSIS — R10827 Generalized rebound abdominal tenderness: Secondary | ICD-10-CM

## 2018-03-04 DIAGNOSIS — Z87891 Personal history of nicotine dependence: Secondary | ICD-10-CM | POA: Diagnosis not present

## 2018-03-04 DIAGNOSIS — F329 Major depressive disorder, single episode, unspecified: Secondary | ICD-10-CM | POA: Diagnosis present

## 2018-03-04 DIAGNOSIS — E785 Hyperlipidemia, unspecified: Secondary | ICD-10-CM | POA: Diagnosis present

## 2018-03-04 DIAGNOSIS — M109 Gout, unspecified: Secondary | ICD-10-CM | POA: Diagnosis present

## 2018-03-04 DIAGNOSIS — I10 Essential (primary) hypertension: Secondary | ICD-10-CM | POA: Diagnosis present

## 2018-03-04 HISTORY — DX: Gastro-esophageal reflux disease without esophagitis: K21.9

## 2018-03-04 HISTORY — DX: Depression, unspecified: F32.A

## 2018-03-04 HISTORY — DX: Major depressive disorder, single episode, unspecified: F32.9

## 2018-03-04 LAB — POCT URINALYSIS DIPSTICK
Glucose, UA: NEGATIVE
Ketones, UA: 5
Nitrite, UA: NEGATIVE
Protein, UA: POSITIVE — AB
Urobilinogen, UA: 0.2 E.U./dL
pH, UA: 5 (ref 5.0–8.0)

## 2018-03-04 LAB — CBC WITH DIFFERENTIAL/PLATELET
Basophils Absolute: 0.1 10*3/uL (ref 0–0.1)
Basophils Relative: 1 %
Eosinophils Absolute: 0.4 10*3/uL (ref 0–0.7)
Eosinophils Relative: 3 %
HCT: 48.9 % (ref 40.0–52.0)
Hemoglobin: 17.1 g/dL (ref 13.0–18.0)
Lymphocytes Relative: 18 %
Lymphs Abs: 2.1 10*3/uL (ref 1.0–3.6)
MCH: 34.8 pg — ABNORMAL HIGH (ref 26.0–34.0)
MCHC: 34.9 g/dL (ref 32.0–36.0)
MCV: 99.6 fL (ref 80.0–100.0)
Monocytes Absolute: 1.4 10*3/uL — ABNORMAL HIGH (ref 0.2–1.0)
Monocytes Relative: 12 %
Neutro Abs: 7.4 10*3/uL — ABNORMAL HIGH (ref 1.4–6.5)
Neutrophils Relative %: 66 %
Platelets: 214 10*3/uL (ref 150–440)
RBC: 4.91 MIL/uL (ref 4.40–5.90)
RDW: 13.3 % (ref 11.5–14.5)
WBC: 11.2 10*3/uL — ABNORMAL HIGH (ref 3.8–10.6)

## 2018-03-04 LAB — GLUCOSE, CAPILLARY: Glucose-Capillary: 95 mg/dL (ref 70–99)

## 2018-03-04 LAB — URINALYSIS, COMPLETE (UACMP) WITH MICROSCOPIC
Bacteria, UA: NONE SEEN
Bilirubin Urine: NEGATIVE
Glucose, UA: NEGATIVE mg/dL
Hgb urine dipstick: NEGATIVE
Ketones, ur: NEGATIVE mg/dL
Leukocytes, UA: NEGATIVE
Nitrite: NEGATIVE
Protein, ur: 30 mg/dL — AB
Specific Gravity, Urine: 1.015 (ref 1.005–1.030)
pH: 5 (ref 5.0–8.0)

## 2018-03-04 LAB — COMPREHENSIVE METABOLIC PANEL
ALT: 45 U/L — ABNORMAL HIGH (ref 0–44)
AST: 52 U/L — ABNORMAL HIGH (ref 15–41)
Albumin: 4.2 g/dL (ref 3.5–5.0)
Alkaline Phosphatase: 69 U/L (ref 38–126)
Anion gap: 15 (ref 5–15)
BUN: 42 mg/dL — ABNORMAL HIGH (ref 6–20)
CO2: 20 mmol/L — ABNORMAL LOW (ref 22–32)
Calcium: 8.8 mg/dL — ABNORMAL LOW (ref 8.9–10.3)
Chloride: 95 mmol/L — ABNORMAL LOW (ref 98–111)
Creatinine, Ser: 7.36 mg/dL — ABNORMAL HIGH (ref 0.61–1.24)
GFR calc Af Amer: 9 mL/min — ABNORMAL LOW (ref >60)
GFR calc non Af Amer: 8 mL/min — ABNORMAL LOW (ref >60)
Glucose, Bld: 120 mg/dL — ABNORMAL HIGH (ref 70–99)
Potassium: 4.7 mmol/L (ref 3.5–5.1)
Sodium: 130 mmol/L — ABNORMAL LOW (ref 135–145)
Total Bilirubin: 1.4 mg/dL — ABNORMAL HIGH (ref 0.3–1.2)
Total Protein: 7.5 g/dL (ref 6.5–8.1)

## 2018-03-04 LAB — TROPONIN I: Troponin I: <0.03 ng/mL (ref <0.03)

## 2018-03-04 LAB — LACTIC ACID, PLASMA
Lactic Acid, Venous: 1 mmol/L (ref 0.5–1.9)
Lactic Acid, Venous: 0.9 mmol/L (ref 0.5–1.9)

## 2018-03-04 LAB — HEMOGLOBIN A1C
Hgb A1c MFr Bld: 5.8 % — ABNORMAL HIGH (ref 4.8–5.6)
Mean Plasma Glucose: 119.76 mg/dL

## 2018-03-04 LAB — LIPASE, BLOOD: Lipase: 51 U/L (ref 11–51)

## 2018-03-04 LAB — GLUCOSE, POCT (MANUAL RESULT ENTRY): POC Glucose: 148 mg/dl — AB (ref 70–99)

## 2018-03-04 LAB — TSH: TSH: 0.666 u[IU]/mL (ref 0.350–4.500)

## 2018-03-04 MED ORDER — INSULIN ASPART 100 UNIT/ML ~~LOC~~ SOLN: 0.0000 [IU] | Freq: Every day | SUBCUTANEOUS | Status: DC

## 2018-03-04 MED ORDER — IOPAMIDOL (ISOVUE-300) INJECTION 61%
30.0000 mL | Freq: Once | INTRAVENOUS | Status: AC
  Administered 2018-03-04: 30 mL via ORAL

## 2018-03-04 MED ORDER — ONDANSETRON HCL 4 MG PO TABS: 4.0000 mg | ORAL_TABLET | Freq: Four times a day (QID) | ORAL | Status: DC | PRN

## 2018-03-04 MED ORDER — SODIUM CHLORIDE 0.9 % IV BOLUS
1000.0000 mL | Freq: Once | INTRAVENOUS | Status: AC
  Administered 2018-03-04: 1000 mL via INTRAVENOUS

## 2018-03-04 MED ORDER — ONDANSETRON HCL 4 MG/2ML IJ SOLN: 4.0000 mg | Freq: Four times a day (QID) | INTRAMUSCULAR | Status: DC | PRN

## 2018-03-04 MED ORDER — SODIUM CHLORIDE 0.9% FLUSH
3.0000 mL | Freq: Two times a day (BID) | INTRAVENOUS | Status: DC
  Administered 2018-03-04 – 2018-03-05 (×3): 3 mL via INTRAVENOUS

## 2018-03-04 MED ORDER — INFLUENZA VAC SPLIT QUAD 0.5 ML IM SUSY
0.5000 mL | PREFILLED_SYRINGE | INTRAMUSCULAR | Status: AC
  Administered 2018-03-05: 0.5 mL via INTRAMUSCULAR
  Filled 2018-03-04: qty 0.5

## 2018-03-04 MED ORDER — ACETAMINOPHEN 325 MG PO TABS
650.0000 mg | ORAL_TABLET | Freq: Four times a day (QID) | ORAL | Status: DC | PRN
  Administered 2018-03-05: 650 mg via ORAL
  Filled 2018-03-04: qty 2

## 2018-03-04 MED ORDER — INSULIN ASPART 100 UNIT/ML ~~LOC~~ SOLN
0.0000 [IU] | Freq: Three times a day (TID) | SUBCUTANEOUS | Status: DC
  Administered 2018-03-05: 2 [IU] via SUBCUTANEOUS
  Filled 2018-03-04: qty 1

## 2018-03-04 MED ORDER — POLYETHYLENE GLYCOL 3350 17 G PO PACK: 17.0000 g | PACK | Freq: Every day | ORAL | Status: DC | PRN

## 2018-03-04 MED ORDER — SODIUM CHLORIDE 0.9 % IV BOLUS
2000.0000 mL | Freq: Once | INTRAVENOUS | Status: AC
  Administered 2018-03-05: 2000 mL via INTRAVENOUS

## 2018-03-04 MED ORDER — ALBUTEROL SULFATE (2.5 MG/3ML) 0.083% IN NEBU: 2.5000 mg | INHALATION_SOLUTION | RESPIRATORY_TRACT | Status: DC | PRN

## 2018-03-04 MED ORDER — HEPARIN SODIUM (PORCINE) 5000 UNIT/ML IJ SOLN
5000.0000 [IU] | Freq: Three times a day (TID) | INTRAMUSCULAR | Status: DC
  Administered 2018-03-04 – 2018-03-06 (×5): 5000 [IU] via SUBCUTANEOUS
  Filled 2018-03-04 (×5): qty 1

## 2018-03-04 MED ORDER — SODIUM CHLORIDE 0.9 % IV SOLN
INTRAVENOUS | Status: DC
  Administered 2018-03-04 – 2018-03-06 (×5): via INTRAVENOUS

## 2018-03-04 MED ORDER — ACETAMINOPHEN 650 MG RE SUPP: 650.0000 mg | Freq: Four times a day (QID) | RECTAL | Status: DC | PRN

## 2018-03-04 NOTE — Progress Notes (Signed)
Advance care planning  Purpose of Encounter Discussed regarding acute kidney injury, hospitalization and plan.  CODE STATUS discussion  Parties in Attendance Patient  Patients Decisional capacity Alert and oriented.  Able to make medical decisions.  Does not have a documented healthcare power of attorney.  Encouraged to finish documentation.  Wants his wife to be his healthcare power of attorney.  Discussed regarding acute kidney injury, work-up and treatment plan.  All questions answered. Patient would like hemodialysis if needed. CODE STATUS discussion.  Patient does not want to be on prolonged ventilatory support.  Would like temporary measures including CPR and intubation if needed.  Orders entered for full code.  Time spent -18 minutes

## 2018-03-04 NOTE — Progress Notes (Signed)
Subjective:    Patient ID: Dean Clements, male    DOB: May 30, 1966, 52 y.o.   MRN: [id number]  Dean Clements is a 52 y.o. male presenting on 03/04/2018 for Dizziness (blurry vision, fatigue insomnia, lack of appetite  and sweaty x 1 week)   HPI Dizziness Patient presents today with malaise x 1 week.  Symptoms include being very dizzy, having very blurry vision, is having transient movement of objects in vision, very bright sunlight is bothersome to patient.  Very poor sleep, reduced appetite over last 1 week.  He reports he may have had some sleep last night with use of benadryl. - Was drinking alcohol a lot until 3 weeks ago.  Now 1-2 beer per day.  Over this last 1 week has only had 1 beer all week. No sensations of withdrawal symptoms as experienced on 02/09/2018 today.  He completed his Librium as prescribed by EMS and has had none in last week. - Patient has type 2 diabetes, symptoms similar when he looks up diabetes on [employer] (see ROS).  Patient did take lisinopril today for hypertension.  Denies any recreational substance use.   Is continuing to drink water, but has had very little urine output.  Has had very little BM over the last 7 days.  Last BM very small and about 3 days ago - Patient struggles to remember the last BM date.  Social History   Tobacco Use  . Smoking status: Former Smoker    Packs/day: 0.20    Years: 3.00    Pack years: 0.60    Types: Cigarettes  . Smokeless tobacco: Never Used  Substance Use Topics  . Alcohol use: Yes    Alcohol/week: 28.0 standard drinks    Types: 28 Cans of beer per week  . Drug use: No    Review of Systems  Constitutional: Positive for diaphoresis.  Eyes: Positive for pain (increased eye pressure) and visual disturbance.  Endocrine: Positive for heat intolerance, polydipsia and polyuria (nocturnal). Negative for polyphagia.  Neurological: Positive for dizziness and light-headedness.  Psychiatric/Behavioral:  Positive for sleep disturbance.   Per HPI unless specifically indicated above     Objective:    BP (!) 72/50 (BP Location: Left Arm, Patient Position: Supine, Cuff Size: Normal)   Pulse 91   Temp 97.6 F (36.4 C) (Oral)   Ht 5\' 11"  (1.803 m)   Wt 221 lb 9.6 oz (100.5 kg)   BMI 30.91 kg/m   Wt Readings from Last 3 Encounters:  03/04/18 221 lb 9.6 oz (100.5 kg)  02/09/18 220 lb (99.8 kg)  11/17/17 239 lb 9.6 oz (108.7 kg)    Physical Exam  Constitutional: He is oriented to person, place, and time. He appears well-developed and well-nourished. No distress.  HENT:  Head: Normocephalic and atraumatic.  Eyes: Pupils are equal, round, and reactive to light. Conjunctivae and EOM are normal.  Cardiovascular: Normal rate, regular rhythm, S1 normal, S2 normal, normal heart sounds and intact distal pulses.  Pulmonary/Chest: Effort normal and breath sounds normal. No respiratory distress.  Abdominal: Soft. Bowel sounds are decreased. There is no hepatosplenomegaly. There is generalized tenderness. There is rebound and CVA tenderness (right side, but pt admits is usual back pain sensation). There is no rigidity, no guarding, no tenderness at McBurney's point and negative Murphy's sign. No hernia.  Neurological: He is alert and oriented to person, place, and time.  Skin: Skin is warm. Capillary refill takes less than 2 seconds. He is diaphoretic.  Psychiatric: He has a normal mood and affect. His behavior is normal. Judgment and thought content normal.  Vitals reviewed.   Results for orders placed or performed in visit on 03/04/18  POCT urinalysis dipstick  Result Value Ref Range   Color, UA     Clarity, UA     Glucose, UA Negative Negative   Bilirubin, UA large    Ketones, UA 5    Spec Grav, UA     Blood, UA mod    pH, UA 5.0 5.0 - 8.0   Protein, UA Positive (A) Negative   Urobilinogen, UA 0.2 0.2 or 1.0 E.U./dL   Nitrite, UA neg    Leukocytes, UA Trace (A) Negative   Appearance       Odor        Assessment & Plan:   Problem List Items Addressed This Visit    None    Visit Diagnoses    Hypotension, unspecified hypotension type    -  Primary   Generalized rebound abdominal tenderness        Acute abdominal tenderness, malaise x 1 week.  Profound hypotension with symptoms of dizziness, blurry vision.  Patient possibly with sepsis, unknown cause as he is presenting with possible UTI or abdominal source of infection with rebound tenderness, no BM over last 1 week, very decreased bowel sounds.  Plan: 1. EMS is called for patient transport to ER. 2. Followup with Dr  Althea Charon after hospital or ER discharge.  Follow up plan: Return if symptoms worsen or fail to improve and after discharge.  Wilhelmina Mcardle, DNP, AGPCNP-BC Adult Gerontology Primary Care Nurse Practitioner [facility] 03/04/2018, 4:00 PM

## 2018-03-04 NOTE — Patient Instructions (Signed)
[name],   Thank you for coming in to clinic today.  1. Go to hospital via EMS.  Your blood pressure is too low for you to drive.  Your exam shows that you may have sepsis from a UTI or abdominal source.  Please schedule a follow-up appointment with Dr. Althea Charon after discharge.  If you have any other questions or concerns, please feel free to call the clinic or send a message through MyChart. You may also schedule an earlier appointment if necessary.  You will receive a survey after today's visit either digitally by e-mail or paper by [employer]. Your experiences and feedback matter to [place].  Please respond so we know how we are doing as we provide care for you.   Wilhelmina Mcardle, DNP, AGNP-BC Adult Gerontology Nurse Practitioner [facility], [facility]

## 2018-03-04 NOTE — ED Triage Notes (Addendum)
Pt here from [facility] with c/o feeling weak, headache and hypotension. BP at clinic was 82/52. Pt a/o x 4. Also reports decreased urination and diarrhea x 2 days.

## 2018-03-04 NOTE — ED Notes (Signed)
Devan RN, aware of bed assigned  

## 2018-03-04 NOTE — ED Provider Notes (Signed)
[facility] Emergency Department Provider Note ____________________________________________   First MD Initiated Contact with Patient 03/04/18 1725     (approximate)  I have reviewed the triage vital signs and the nursing notes.   HISTORY  Chief Complaint Weakness; Headache; and Hypotension    HPI Dean Clements is a 52 y.o. male who presents with multiple complaints over the last week, but primarily generalized weakness, gradual onset and worsening.  He also reports frontal headache, intermittent abdominal pain, loose stools, and some nausea.  Past Medical History:  Diagnosis Date  . Depression   . Diabetes mellitus without complication (HCC)   . GERD (gastroesophageal reflux disease)   . Hyperlipidemia   . Hypertension   . Low back pain 2004   occasional back flares each year.     Patient Active Problem List   Diagnosis Date Noted  . Chronic bilateral back pain 06/04/2016  . Type 2 diabetes mellitus with other specified complication (HCC) 09/10/2015  . Essential hypertension 04/03/2015  . Anxiety 04/03/2015  . GERD (gastroesophageal reflux disease) 01/15/2015  . Insomnia 01/15/2015  . Obesity (BMI 30.0-34.9) 01/15/2015    Past Surgical History:  Procedure Laterality Date  . none      Prior to Admission medications   Medication Sig Start Date End Date Taking? Authorizing Provider  FLUoxetine (PROZAC) 20 MG capsule Take 1 capsule (20 mg total) by mouth 2 (two) times daily. 11/17/17   Karamalegos, Netta Neat, DO  ketorolac (TORADOL) 60 MG/2ML SOLN injection INJECT 2 MLS INTO MUSCLE AS NEEDED FOR SEVERE PAIN Patient not taking: Reported on 03/04/2018 01/15/18   Smitty Cords, DO  lisinopril (PRINIVIL,ZESTRIL) 10 MG tablet Take 1 tablet (10 mg total) by mouth daily. 11/17/17   Karamalegos, Netta Neat, DO  metFORMIN (GLUCOPHAGE) 500 MG tablet Take 1 tablet (500 mg total) by mouth 2 (two) times daily with a meal. 11/17/17    Karamalegos, Netta Neat, DO  naproxen sodium (ANAPROX) 220 MG tablet Take 220 mg by mouth 2 (two) times daily with a meal.    [provider]  ondansetron (ZOFRAN ODT) 4 MG disintegrating tablet Take 1 tablet (4 mg total) by mouth every 8 (eight) hours as needed for nausea or vomiting. Patient not taking: Reported on 03/04/2018 02/09/18   Merrily Brittle, MD  pantoprazole (PROTONIX) 40 MG tablet Take 1 tablet (40 mg total) by mouth daily. 11/17/17   Smitty Cords, DO    Allergies Patient has no known allergies.  Family History  Problem Relation Age of Onset  . Cancer Mother   . Heart disease Father   . Diabetes Father   . Hypertension Father     Social History Social History   Tobacco Use  . Smoking status: Former Smoker    Packs/day: 0.20    Years: 3.00    Pack years: 0.60    Types: Cigarettes  . Smokeless tobacco: Never Used  Substance Use Topics  . Alcohol use: Yes    Alcohol/week: 28.0 standard drinks    Types: 28 Cans of beer per week  . Drug use: No    Review of Systems  Constitutional: No fever.  Positive for weakness. Eyes: No redness. ENT: No sore throat. Cardiovascular: Denies chest pain. Respiratory: Denies shortness of breath. Gastrointestinal: No vomiting.  Positive for mild diarrhea..  Genitourinary: Negative for dysuria.  Musculoskeletal: Negative for back pain. Skin: Negative for rash. Neurological: Positive for headache.   ____________________________________________   PHYSICAL EXAM:  VITAL SIGNS:  ED Triage Vitals  Enc Vitals Group     BP 03/04/18 1708 90/63     Pulse Rate 03/04/18 1708 89     Resp 03/04/18 1708 18     Temp 03/04/18 1708 97.9 F (36.6 C)     Temp Source 03/04/18 1708 Oral     SpO2 03/04/18 1708 96 %     Weight 03/04/18 1713 221 lb 9 oz (100.5 kg)     Height 03/04/18 1713 5\' 11"  (1.803 m)     Head Circumference --      Peak Flow --      Pain Score 03/04/18 1713 0     Pain Loc --      Pain Edu? --        Excl. in GC? --     Constitutional: Alert and oriented.  Relatively well appearing and in no acute distress. Eyes: Conjunctivae are normal.  EOMI. Head: Atraumatic. Nose: No congestion/rhinnorhea. Mouth/Throat: Mucous membranes are dry.   Neck: Normal range of motion.  Cardiovascular: Normal rate, regular rhythm. Grossly normal heart sounds.  Good peripheral circulation. Respiratory: Normal respiratory effort.  No retractions. Lungs CTAB. Gastrointestinal: Soft with mild diffuse discomfort but no focal tenderness. No distention.  Genitourinary: No flank tenderness. Musculoskeletal: No lower extremity edema.  Extremities warm and well perfused.  Neurologic:  Normal speech and language. No gross focal neurologic deficits are appreciated.  Skin:  Skin is warm and dry. No rash noted. Psychiatric: Mood and affect are normal. Speech and behavior are normal.  ____________________________________________   LABS (all labs ordered are listed, but only abnormal results are displayed)  Labs Reviewed  COMPREHENSIVE METABOLIC PANEL - Abnormal; Notable for the following components:      Result Value   Sodium 130 (*)    Chloride 95 (*)    CO2 20 (*)    Glucose, Bld 120 (*)    BUN 42 (*)    Creatinine, Ser 7.36 (*)    Calcium 8.8 (*)    AST 52 (*)    ALT 45 (*)    Total Bilirubin 1.4 (*)    GFR calc non Af Amer 8 (*)    GFR calc Af Amer 9 (*)    All other components within normal limits  CBC WITH DIFFERENTIAL/PLATELET - Abnormal; Notable for the following components:   WBC 11.2 (*)    MCH 34.8 (*)    Neutro Abs 7.4 (*)    Monocytes Absolute 1.4 (*)    All other components within normal limits  URINALYSIS, COMPLETE (UACMP) WITH MICROSCOPIC - Abnormal; Notable for the following components:   Color, Urine YELLOW (*)    APPearance HAZY (*)    Protein, ur 30 (*)    All other components within normal limits  LACTIC ACID, PLASMA  LIPASE, BLOOD  TROPONIN I  LACTIC ACID, PLASMA    ____________________________________________  EKG  ED ECG REPORT I, Dionne Bucy, the attending physician, personally viewed and interpreted this ECG.  Date: 03/04/2018 EKG Time: 1717 Rate: 86 Rhythm: normal sinus rhythm QRS Axis: Left axis Intervals: normal ST/T Wave abnormalities: normal Narrative Interpretation: no evidence of acute ischemia  ____________________________________________  RADIOLOGY    ____________________________________________   PROCEDURES  Procedure(s) performed: No  Procedures  Critical Care performed: No ____________________________________________   INITIAL IMPRESSION / ASSESSMENT AND PLAN / ED COURSE  Pertinent labs & imaging results that were available during my care of the patient were reviewed by me and considered in my medical decision  making (see chart for details).  52 year old male with history of diabetes on metformin presents with generalized weakness, headache, some loose stools, and decreased urination over the last week.  On exam, the patient is somewhat hypotensive.  His other vital signs are normal.  He is relatively well-appearing otherwise, and besides dry mucous membranes the remainder of the exam is unremarkable.  Differential includes hyperglycemia, DKA, dehydration, renal insufficiency, other metabolic etiology, or infection/sepsis.  Less likely cardiac cause.  We will give fluids, obtain labs and sepsis work-up, and reassess.  ----------------------------------------- 7:32 PM on 03/04/2018 -----------------------------------------  Lab work-up reveals significant acute renal insufficiency.  Patient will require admission for further work-up.  I sent the patient out to the hospitalist Dr. Elpidio Anis. ____________________________________________   FINAL CLINICAL IMPRESSION(S) / ED DIAGNOSES  Final diagnoses:  Acute renal insufficiency      NEW MEDICATIONS STARTED DURING THIS VISIT:  New Prescriptions    No medications on file     Note:  This document was prepared using Dragon voice recognition software and may include unintentional dictation errors.   Dionne Bucy, MD 03/04/18 1932

## 2018-03-04 NOTE — H&P (Signed)
[facility] - [place] at [facility]   PATIENT NAME: Dean Clements    MR#:  [id number]  DATE OF BIRTH:  1966-03-09  DATE OF ADMISSION:  03/04/2018  PRIMARY CARE PHYSICIAN: Smitty Cords, DO   REQUESTING/REFERRING PHYSICIAN: Dr. Carolin Coy  CHIEF COMPLAINT:   Chief Complaint  Patient presents with  . Weakness  . Headache  . Hypotension    HISTORY OF PRESENT ILLNESS:  Dean Clements  is a 52 y.o. male with a known history of hypertension, diabetes, GERD presents to the emergency room complaining of generalized weakness, abdominal pain.  He had 5 episodes of soft small amount of stool yesterday.  His blood pressure was in systolic 60s at his primary care physician's office.  No change in medications recently.  Takes 2 tablets of Aleve daily.  His blood pressure normally runs around 135/80.  Afebrile.  No vomiting.  Decreased urine output.  Found to have creatinine 7.4.  Recent creatinine 3 weeks back was 0.8.  PAST MEDICAL HISTORY:   Past Medical History:  Diagnosis Date  . Depression   . Diabetes mellitus without complication (HCC)   . GERD (gastroesophageal reflux disease)   . Hyperlipidemia   . Hypertension   . Low back pain 2004   occasional back flares each year.     PAST SURGICAL HISTORY:   Past Surgical History:  Procedure Laterality Date  . none      SOCIAL HISTORY:   Social History   Tobacco Use  . Smoking status: Former Smoker    Packs/day: 0.20    Years: 3.00    Pack years: 0.60    Types: Cigarettes  . Smokeless tobacco: Never Used  Substance Use Topics  . Alcohol use: Yes    Alcohol/week: 28.0 standard drinks    Types: 28 Cans of beer per week    FAMILY HISTORY:   Family History  Problem Relation Age of Onset  . Cancer Mother   . Heart disease Father   . Diabetes Father   . Hypertension Father     DRUG ALLERGIES:  No Known Allergies  REVIEW OF SYSTEMS:   Review of Systems  Constitutional: Positive for  malaise/fatigue. Negative for chills and fever.  HENT: Negative for sore throat.   Eyes: Negative for blurred vision, double vision and pain.  Respiratory: Negative for cough, hemoptysis, shortness of breath and wheezing.   Cardiovascular: Negative for chest pain, palpitations, orthopnea and leg swelling.  Gastrointestinal: Positive for abdominal pain and nausea. Negative for constipation, diarrhea, heartburn and vomiting.  Genitourinary: Negative for dysuria and hematuria.  Musculoskeletal: Negative for back pain and joint pain.  Skin: Negative for rash.  Neurological: Negative for sensory change, speech change, focal weakness and headaches.  Endo/Heme/Allergies: Does not bruise/bleed easily.  Psychiatric/Behavioral: Negative for depression. The patient is not nervous/anxious.     MEDICATIONS AT HOME:   Prior to Admission medications   Medication Sig Start Date End Date Taking? Authorizing Provider  FLUoxetine (PROZAC) 20 MG capsule Take 1 capsule (20 mg total) by mouth 2 (two) times daily. 11/17/17   Karamalegos, Netta Neat, DO  ketorolac (TORADOL) 60 MG/2ML SOLN injection INJECT 2 MLS INTO MUSCLE AS NEEDED FOR SEVERE PAIN Patient not taking: Reported on 03/04/2018 01/15/18   Smitty Cords, DO  lisinopril (PRINIVIL,ZESTRIL) 10 MG tablet Take 1 tablet (10 mg total) by mouth daily. 11/17/17   Karamalegos, Netta Neat, DO  metFORMIN (GLUCOPHAGE) 500 MG tablet Take 1 tablet (500 mg total) by mouth  2 (two) times daily with a meal. 11/17/17   Karamalegos, Netta Neat, DO  naproxen sodium (ANAPROX) 220 MG tablet Take 220 mg by mouth 2 (two) times daily with a meal.    [provider]  ondansetron (ZOFRAN ODT) 4 MG disintegrating tablet Take 1 tablet (4 mg total) by mouth every 8 (eight) hours as needed for nausea or vomiting. Patient not taking: Reported on 03/04/2018 02/09/18   Merrily Brittle, MD  pantoprazole (PROTONIX) 40 MG tablet Take 1 tablet (40 mg total) by mouth daily.  11/17/17   Karamalegos, Netta Neat, DO     VITAL SIGNS:  Blood pressure 100/67, pulse 80, temperature 97.9 F (36.6 C), temperature source Oral, resp. rate (!) 22, height 5\' 11"  (1.803 m), weight 100.5 kg, SpO2 97 %.  PHYSICAL EXAMINATION:  Physical Exam  GENERAL:  52 y.o.-year-old patient lying in the bed with no acute distress.  EYES: Pupils equal, round, reactive to light and accommodation. No scleral icterus. Extraocular muscles intact.  HEENT: Head atraumatic, normocephalic. Oropharynx and nasopharynx clear. No oropharyngeal erythema, dry oral mucosa  NECK:  Supple, no jugular venous distention. No thyroid enlargement, no tenderness.  LUNGS: Normal breath sounds bilaterally, no wheezing, rales, rhonchi. No use of accessory muscles of respiration.  CARDIOVASCULAR: S1, S2 normal. No murmurs, rubs, or gallops.  ABDOMEN: Soft, nontender, nondistended. Bowel sounds present. No organomegaly or mass.  EXTREMITIES: No pedal edema, cyanosis, or clubbing. + 2 pedal & radial pulses b/l.   NEUROLOGIC: Cranial nerves II through XII are intact. No focal Motor or sensory deficits appreciated b/l PSYCHIATRIC: The patient is alert and oriented x 3. Good affect.  SKIN: No obvious rash, lesion, or ulcer.   LABORATORY PANEL:   CBC Recent Labs  Lab 03/04/18 1723  WBC 11.2*  HGB 17.1  HCT 48.9  PLT 214   ------------------------------------------------------------------------------------------------------------------  Chemistries  Recent Labs  Lab 03/04/18 1723  NA 130*  K 4.7  CL 95*  CO2 20*  GLUCOSE 120*  BUN 42*  CREATININE 7.36*  CALCIUM 8.8*  AST 52*  ALT 45*  ALKPHOS 69  BILITOT 1.4*   ------------------------------------------------------------------------------------------------------------------  Cardiac Enzymes Recent Labs  Lab 03/04/18 1723  TROPONINI <0.03    ------------------------------------------------------------------------------------------------------------------  RADIOLOGY:  No results found.   IMPRESSION AND PLAN:   *Acute kidney injury secondary to ATN.  Patient was significantly hypotensive and seems dehydrated.  Bolus normal saline 2 L stat and start maintenance normal saline.  Monitor input and output.  Will check CT scan of the abdomen due to his abdominal pain.  Consult nephrology.  Sent message to Dr. Thedore Mins.  Check urine creatinine protein ratio.  Urine sodium. Could also be due to NSAIDs.  Stop Aleve.  *Diabetes mellitus.  Hold metformin.  Sliding scale insulin.  *Hypertension.  Hold medications at this time.  *DVT prophylaxis with heparin  All the records are reviewed and case discussed with ED provider. Management plans discussed with the patient, family and they are in agreement.  CODE STATUS: Full code  TOTAL TIME TAKING CARE OF THIS PATIENT: 40 minutes.   Molinda Bailiff Jericca Russett M.D on 03/04/2018 at 7:59 PM  Between 7am to 6pm - Pager - [phone]  After 6pm go to [email or web address] - password [email or web address] [facility]  [facility] [place] [facility]  Office  [phone]  CC: Primary care physician; Smitty Cords, DO  Note: This dictation was prepared with Dragon dictation along with smaller phrase technology. Any transcriptional errors that result from this process are unintentional.

## 2018-03-05 LAB — CBC
HCT: 42.2 % (ref 40.0–52.0)
Hemoglobin: 15 g/dL (ref 13.0–18.0)
MCH: 35.5 pg — ABNORMAL HIGH (ref 26.0–34.0)
MCHC: 35.5 g/dL (ref 32.0–36.0)
MCV: 99.8 fL (ref 80.0–100.0)
Platelets: 135 10*3/uL — ABNORMAL LOW (ref 150–440)
RBC: 4.22 MIL/uL — ABNORMAL LOW (ref 4.40–5.90)
RDW: 13.4 % (ref 11.5–14.5)
WBC: 6.8 10*3/uL (ref 3.8–10.6)

## 2018-03-05 LAB — BASIC METABOLIC PANEL
Anion gap: 7 (ref 5–15)
BUN: 40 mg/dL — ABNORMAL HIGH (ref 6–20)
CO2: 24 mmol/L (ref 22–32)
Calcium: 7.5 mg/dL — ABNORMAL LOW (ref 8.9–10.3)
Chloride: 103 mmol/L (ref 98–111)
Creatinine, Ser: 4.29 mg/dL — ABNORMAL HIGH (ref 0.61–1.24)
GFR calc Af Amer: 17 mL/min — ABNORMAL LOW (ref >60)
GFR calc non Af Amer: 15 mL/min — ABNORMAL LOW (ref >60)
Glucose, Bld: 92 mg/dL (ref 70–99)
Potassium: 5 mmol/L (ref 3.5–5.1)
Sodium: 134 mmol/L — ABNORMAL LOW (ref 135–145)

## 2018-03-05 LAB — GLUCOSE, CAPILLARY
Glucose-Capillary: 92 mg/dL (ref 70–99)
Glucose-Capillary: 99 mg/dL (ref 70–99)
Glucose-Capillary: 158 mg/dL — ABNORMAL HIGH (ref 70–99)
Glucose-Capillary: 143 mg/dL — ABNORMAL HIGH (ref 70–99)

## 2018-03-05 LAB — PROTEIN / CREATININE RATIO, URINE
Creatinine, Urine: 244 mg/dL
Protein Creatinine Ratio: 0.3 mg/mg{Cre} — ABNORMAL HIGH (ref 0.00–0.15)
Total Protein, Urine: 72 mg/dL

## 2018-03-05 LAB — CREATININE, URINE, RANDOM: Creatinine, Urine: 227 mg/dL

## 2018-03-05 LAB — SODIUM, URINE, RANDOM: Sodium, Ur: 32 mmol/L

## 2018-03-05 MED ORDER — PREDNISONE 20 MG PO TABS
20.0000 mg | ORAL_TABLET | Freq: Every day | ORAL | Status: DC
  Administered 2018-03-05 – 2018-03-06 (×2): 20 mg via ORAL
  Filled 2018-03-05 (×2): qty 1

## 2018-03-05 MED ORDER — FLUOXETINE HCL 20 MG PO CAPS
20.0000 mg | ORAL_CAPSULE | Freq: Two times a day (BID) | ORAL | Status: DC
  Administered 2018-03-05 – 2018-03-06 (×3): 20 mg via ORAL
  Filled 2018-03-05 (×5): qty 1

## 2018-03-05 MED ORDER — PANTOPRAZOLE SODIUM 40 MG PO TBEC
40.0000 mg | DELAYED_RELEASE_TABLET | Freq: Every day | ORAL | Status: DC
  Administered 2018-03-05 – 2018-03-06 (×2): 40 mg via ORAL
  Filled 2018-03-05 (×2): qty 1

## 2018-03-05 NOTE — Plan of Care (Signed)
Stable. Waiting for creatine levels to decrease. Problem: Education: Goal: Knowledge of General Education information will improve Description Including pain rating scale, medication(s)/side effects and non-pharmacologic comfort measures Outcome: Progressing   Problem: Health Behavior/Discharge Planning: Goal: Ability to manage health-related needs will improve Outcome: Progressing   Problem: Clinical Measurements: Goal: Ability to maintain clinical measurements within normal limits will improve Outcome: Progressing Goal: Will remain free from infection Outcome: Progressing Goal: Diagnostic test results will improve Outcome: Progressing Goal: Respiratory complications will improve Outcome: Progressing Goal: Cardiovascular complication will be avoided Outcome: Progressing   Problem: Activity: Goal: Risk for activity intolerance will decrease Outcome: Progressing   Problem: Nutrition: Goal: Adequate nutrition will be maintained Outcome: Progressing   Problem: Coping: Goal: Level of anxiety will decrease Outcome: Progressing   Problem: Elimination: Goal: Will not experience complications related to bowel motility Outcome: Progressing Goal: Will not experience complications related to urinary retention Outcome: Progressing   Problem: Pain Managment: Goal: General experience of comfort will improve Outcome: Progressing   Problem: Safety: Goal: Ability to remain free from injury will improve Outcome: Progressing   Problem: Skin Integrity: Goal: Risk for impaired skin integrity will decrease Outcome: Progressing

## 2018-03-05 NOTE — Progress Notes (Addendum)
[employer] [facility] - [place] at [facility]   PATIENT NAME: Dean Clements    MR#:  [id number]  DATE OF BIRTH:  [birth year]  SUBJECTIVE:  CHIEF COMPLAINT:   Chief Complaint  Patient presents with  . Weakness  . Headache  . Hypotension  Patient seen and evaluated today No complaints of chest pain No shortness of breath No fever and chills  REVIEW OF SYSTEMS:    ROS  CONSTITUTIONAL: No documented fever. No fatigue, weakness. No weight gain, no weight loss.  EYES: No blurry or double vision.  ENT: No tinnitus. No postnasal drip. No redness of the oropharynx.  RESPIRATORY: No cough, no wheeze, no hemoptysis. No dyspnea.  CARDIOVASCULAR: No chest pain. No orthopnea. No palpitations. No syncope.  GASTROINTESTINAL: No nausea, no vomiting or diarrhea. No abdominal pain. No melena or hematochezia.  GENITOURINARY: No dysuria or hematuria.  ENDOCRINE: No polyuria or nocturia. No heat or cold intolerance.  HEMATOLOGY: No anemia. No bruising. No bleeding.  INTEGUMENTARY: No rashes. No lesions.  MUSCULOSKELETAL: No arthritis. No swelling. No gout.  NEUROLOGIC: No numbness, tingling, or ataxia. No seizure-type activity.  PSYCHIATRIC: No anxiety. No insomnia. No ADD.   DRUG ALLERGIES:  No Known Allergies  VITALS:  Blood pressure 108/62, pulse 68, temperature (!) 97.5 F (36.4 C), temperature source Oral, resp. rate 18, height 5\' 11"  (1.803 m), weight 100.3 kg, SpO2 96 %.  PHYSICAL EXAMINATION:   Physical Exam  GENERAL:  52 y.o.-year-old patient lying in the bed with no acute distress.  EYES: Pupils equal, round, reactive to light and accommodation. No scleral icterus. Extraocular muscles intact.  HEENT: Head atraumatic, normocephalic. Oropharynx and nasopharynx clear.  NECK:  Supple, no jugular venous distention. No thyroid enlargement, no tenderness.  LUNGS: Normal breath sounds bilaterally, no wheezing, rales, rhonchi. No use of accessory muscles of respiration.   CARDIOVASCULAR: S1, S2 normal. No murmurs, rubs, or gallops.  ABDOMEN: Soft, nontender, nondistended. Bowel sounds present. No organomegaly or mass.  EXTREMITIES: No cyanosis, clubbing or edema b/l.    NEUROLOGIC: Cranial nerves II through XII are intact. No focal Motor or sensory deficits b/l.   PSYCHIATRIC: The patient is alert and oriented x 3.  SKIN: No obvious rash, lesion, or ulcer.   LABORATORY PANEL:   CBC Recent Labs  Lab 03/05/18 0503  WBC 6.8  HGB 15.0  HCT 42.2  PLT 135*   ------------------------------------------------------------------------------------------------------------------ Chemistries  Recent Labs  Lab 03/04/18 1723 03/05/18 0503  NA 130* 134*  K 4.7 5.0  CL 95* 103  CO2 20* 24  GLUCOSE 120* 92  BUN 42* 40*  CREATININE 7.36* 4.29*  CALCIUM 8.8* 7.5*  AST 52*  --   ALT 45*  --   ALKPHOS 69  --   BILITOT 1.4*  --    ------------------------------------------------------------------------------------------------------------------  Cardiac Enzymes Recent Labs  Lab 03/04/18 1723  TROPONINI <0.03   ------------------------------------------------------------------------------------------------------------------  RADIOLOGY:  Ct Abdomen Pelvis Wo Contrast  Result Date: 03/04/2018 CLINICAL DATA:  52 year old male with abdominal pain. Concern for gastroenteritis versus colitis. EXAM: CT ABDOMEN AND PELVIS WITHOUT CONTRAST TECHNIQUE: Multidetector CT imaging of the abdomen and pelvis was performed following the standard protocol without IV contrast. COMPARISON:  None. FINDINGS: Evaluation of this exam is limited in the absence of intravenous contrast. Lower chest: The visualized lung bases are clear. No intra-abdominal free air or free fluid. Hepatobiliary: Diffuse fatty liver. No intrahepatic biliary ductal dilatation. The gallbladder is unremarkable. Pancreas: Unremarkable. No pancreatic ductal dilatation or surrounding inflammatory changes.  Spleen:  Normal in size without focal abnormality. Adrenals/Urinary Tract: Adrenal glands are unremarkable. Kidneys are normal, without renal calculi, focal lesion, or hydronephrosis. Bladder is unremarkable. Stomach/Bowel: Stomach is within normal limits. Appendix appears normal. No evidence of bowel wall thickening, distention, or inflammatory changes. Vascular/Lymphatic: Mild aortoiliac atherosclerotic disease. No portal venous gas. There is no adenopathy. Reproductive: The prostate and seminal vesicles are grossly unremarkable. No pelvic mass. Other: Small pockets of subcutaneous air in the left anterior pelvic wall, likely related to recent injection. Musculoskeletal: L1 hemangioma.  No acute osseous pathology. IMPRESSION: 1. No acute intra-abdominopelvic pathology. No bowel obstruction or active inflammation. Normal appendix. 2. Fatty liver. Electronically Signed   By: Elgie Collard M.D.   On: 03/04/2018 22:35     ASSESSMENT AND PLAN:  52 year old male patient with history of hypertension, diabetes mellitus type 2, GERD presented to emergency room for low blood pressure  -Acute kidney injury secondary to acute tubular necrosis Kidney functions improving with IV fluid hydration Creatinine improving Discussed with nephrology who agrees with the management plan Stop NSAIDs  -Hypotension improved with IV fluids  -Type 2 diabetes mellitus Metformin on hold sliding scale coverage with insulin Diabetic diet  -DVT prophylaxis subcu heparin   -Transaminitis Follow-up LFTs   All the records are reviewed and case discussed with Care Management/Social Worker. Management plans discussed with the patient, family and they are in agreement.  CODE STATUS: Full code  DVT Prophylaxis: SCDs  TOTAL TIME TAKING CARE OF THIS PATIENT: 34 minutes.   POSSIBLE D/C IN 1 to 2 DAYS, DEPENDING ON CLINICAL CONDITION.  Ihor Austin M.D on 03/05/2018 at 12:15 PM  Between 7am to 6pm - Pager -  [phone]  After 6pm go to [email or web address] - password [email or web address] [facility]  [facility] [place] [facility]  Office  [phone]  CC: Primary care physician; Smitty Cords, DO  Note: This dictation was prepared with Dragon dictation along with smaller phrase technology. Any transcriptional errors that result from this process are unintentional.

## 2018-03-05 NOTE — Consult Note (Signed)
Date: 03/05/2018                  Patient Name:  Dean Clements  MRN: [id number]  DOB: 02-05-1966  Age / Sex: 52 y.o., male         PCP: Smitty Cords, DO                 Service Requesting Consult: IM/ Ihor Austin, MD                 Reason for Consult: ARF            History of Present Illness: Patient is a 52 y.o. male with medical problems of diabetes, GERD, hyperlipidemia, hypertension, low back pain, who was admitted to [facility] on 03/04/2018 for evaluation of generalized weakness, abdominal pain, loose stools.  He was found to be severely hypotensive and his primary care's office with blood pressure in the 60s.  He was referred for evaluation to the emergency room where he was found to have acute increase in creatinine.  He was admitted for further evaluation Today, his creatinine has improved significantly with IV hydration Clinically he feels better.  Blood pressure is still low normal at 108/67   Medications: Outpatient medications: Medications Prior to Admission  Medication Sig Dispense Refill Last Dose  . FLUoxetine (PROZAC) 20 MG capsule Take 1 capsule (20 mg total) by mouth 2 (two) times daily. 180 capsule 3 Taking  . ketorolac (TORADOL) 60 MG/2ML SOLN injection INJECT 2 MLS INTO MUSCLE AS NEEDED FOR SEVERE PAIN (Patient not taking: Reported on 03/04/2018) 2 mL 1 Not Taking  . lisinopril (PRINIVIL,ZESTRIL) 10 MG tablet Take 1 tablet (10 mg total) by mouth daily. 90 tablet 3 Taking  . metFORMIN (GLUCOPHAGE) 500 MG tablet Take 1 tablet (500 mg total) by mouth 2 (two) times daily with a meal. 180 tablet 3 Taking  . naproxen sodium (ANAPROX) 220 MG tablet Take 220 mg by mouth 2 (two) times daily with a meal.   Not Taking  . ondansetron (ZOFRAN ODT) 4 MG disintegrating tablet Take 1 tablet (4 mg total) by mouth every 8 (eight) hours as needed for nausea or vomiting. (Patient not taking: Reported on 03/04/2018) 20 tablet 0 Not Taking  . pantoprazole (PROTONIX)  40 MG tablet Take 1 tablet (40 mg total) by mouth daily. 30 tablet 11 Taking    Current medications: Current Facility-Administered Medications  Medication Dose Route Frequency Provider Last Rate Last Dose  . 0.9 %  sodium chloride infusion   Intravenous Continuous Milagros Loll, MD 150 mL/hr at 03/05/18 0453    . acetaminophen (TYLENOL) tablet 650 mg  650 mg Oral Q6H PRN Milagros Loll, MD       Or  . acetaminophen (TYLENOL) suppository 650 mg  650 mg Rectal Q6H PRN Sudini, Srikar, MD      . albuterol (PROVENTIL) (2.5 MG/3ML) 0.083% nebulizer solution 2.5 mg  2.5 mg Nebulization Q2H PRN Sudini, Srikar, MD      . FLUoxetine (PROZAC) capsule 20 mg  20 mg Oral BID Sudini, Wardell Heath, MD      . heparin injection 5,000 Units  5,000 Units Subcutaneous Q8H Milagros Loll, MD   5,000 Units at 03/05/18 0524  . Influenza vac split quadrivalent PF (FLUARIX) injection 0.5 mL  0.5 mL Intramuscular Tomorrow-1000 Sudini, Srikar, MD      . insulin aspart (novoLOG) injection 0-5 Units  0-5 Units Subcutaneous QHS Milagros Loll, MD      .  insulin aspart (novoLOG) injection 0-9 Units  0-9 Units Subcutaneous TID WC Sudini, Srikar, MD      . ondansetron (ZOFRAN) tablet 4 mg  4 mg Oral Q6H PRN Milagros Loll, MD       Or  . ondansetron (ZOFRAN) injection 4 mg  4 mg Intravenous Q6H PRN Sudini, Srikar, MD      . pantoprazole (PROTONIX) EC tablet 40 mg  40 mg Oral Daily Sudini, Srikar, MD      . polyethylene glycol (MIRALAX / GLYCOLAX) packet 17 g  17 g Oral Daily PRN Sudini, Srikar, MD      . sodium chloride flush (NS) 0.9 % injection 3 mL  3 mL Intravenous Q12H Milagros Loll, MD   3 mL at 03/04/18 2134      Allergies: No Known Allergies    Past Medical History: Past Medical History:  Diagnosis Date  . Depression   . Diabetes mellitus without complication (HCC)   . GERD (gastroesophageal reflux disease)   . Hyperlipidemia   . Hypertension   . Low back pain 2004   occasional back flares each year.       Past Surgical History: Past Surgical History:  Procedure Laterality Date  . none       Family History: Family History  Problem Relation Age of Onset  . Cancer Mother   . Heart disease Father   . Diabetes Father   . Hypertension Father      Social History: Social History   Socioeconomic History  . Marital status: Married    Spouse name: Not on file  . Number of children: Not on file  . Years of education: Not on file  . Highest education level: Not on file  Occupational History  . Not on file  Social Needs  . Financial resource strain: Not on file  . Food insecurity:    Worry: Not on file    Inability: Not on file  . Transportation needs:    Medical: Not on file    Non-medical: Not on file  Tobacco Use  . Smoking status: Former Smoker    Packs/day: 0.20    Years: 3.00    Pack years: 0.60    Types: Cigarettes  . Smokeless tobacco: Never Used  Substance and Sexual Activity  . Alcohol use: Yes    Alcohol/week: 28.0 standard drinks    Types: 28 Cans of beer per week  . Drug use: No  . Sexual activity: Not on file  Lifestyle  . Physical activity:    Days per week: Not on file    Minutes per session: Not on file  . Stress: Not on file  Relationships  . Social connections:    Talks on phone: Not on file    Gets together: Not on file    Attends religious service: Not on file    Active member of club or organization: Not on file    Attends meetings of clubs or organizations: Not on file    Relationship status: Not on file  . Intimate partner violence:    Fear of current or ex partner: Not on file    Emotionally abused: Not on file    Physically abused: Not on file    Forced sexual activity: Not on file  Other Topics Concern  . Not on file  Social History Narrative  . Not on file     Review of Systems: Gen: Generalized weakness HEENT: Dizziness, associated vision changes CV: No chest pain  Resp: Some shortness of breath GI: Decreased  appetite.  Patient had some dental work 2 months ago.  Has not been able to eat much since then.  Has lost weight. GU : No problems reported with voiding.  No blood in the urine MS: Pain in the joint at the base of left great toe Derm:  No complaints Psych: No complaints Heme: No complaints Neuro: No complaints Endocrine: No complaints  Vital Signs: Blood pressure 108/62, pulse 68, temperature (!) 97.5 F (36.4 C), temperature source Oral, resp. rate 18, height 5\' 11"  (1.803 m), weight 100.3 kg, SpO2 96 %.   Intake/Output Summary (Last 24 hours) at 03/05/2018 1128 Last data filed at 03/05/2018 0730 Gross per 24 hour  Intake -  Output 725 ml  Net -725 ml    Weight trends: Filed Weights   03/04/18 1713 03/04/18 2056 03/05/18 0540  Weight: 100.5 kg 97.8 kg 100.3 kg    Physical Exam: General:  No acute distress, laying in the bed  HEENT  anicteric, moist oral mucous membranes  Neck:  Supple, no masses  Lungs:  Clear to auscultation bilaterally  Heart::  Regular rhythm, no rub or gallop  Abdomen:  Soft, nontender, nondistended  Extremities:  No peripheral edema.  Redness, swelling, pain at the base of left great toe (MTP joint)  Neurologic:  Alert, oriented  Skin:  No acute rashes             Lab results: Basic Metabolic Panel: Recent Labs  Lab 03/04/18 1723 03/05/18 0503  NA 130* 134*  K 4.7 5.0  CL 95* 103  CO2 20* 24  GLUCOSE 120* 92  BUN 42* 40*  CREATININE 7.36* 4.29*  CALCIUM 8.8* 7.5*    Liver Function Tests: Recent Labs  Lab 03/04/18 1723  AST 52*  ALT 45*  ALKPHOS 69  BILITOT 1.4*  PROT 7.5  ALBUMIN 4.2   Recent Labs  Lab 03/04/18 1723  LIPASE 51   No results for input(s): AMMONIA in the last 168 hours.  CBC: Recent Labs  Lab 03/04/18 1723 03/05/18 0503  WBC 11.2* 6.8  NEUTROABS 7.4*  --   HGB 17.1 15.0  HCT 48.9 42.2  MCV 99.6 99.8  PLT 214 135*    Cardiac Enzymes: Recent Labs  Lab 03/04/18 1723  TROPONINI <0.03     BNP: Invalid input(s): POCBNP  CBG: Recent Labs  Lab 03/04/18 2114 03/05/18 0731  GLUCAP 95 92    Microbiology: No results found for this or any previous visit (from the past 720 hour(s)).   Coagulation Studies: No results for input(s): LABPROT, INR in the last 72 hours.  Urinalysis: Recent Labs    03/04/18 1625 03/04/18 1744  COLORURINE  --  YELLOW*  LABSPEC  --  1.015  PHURINE  --  5.0  GLUCOSEU  --  NEGATIVE  HGBUR  --  NEGATIVE  BILIRUBINUR large NEGATIVE  KETONESUR  --  NEGATIVE  PROTEINUR Positive* 30*  UROBILINOGEN 0.2  --   NITRITE neg NEGATIVE  LEUKOCYTESUR Trace* NEGATIVE        Imaging: Ct Abdomen Pelvis Wo Contrast  Result Date: 03/04/2018 CLINICAL DATA:  52 year old male with abdominal pain. Concern for gastroenteritis versus colitis. EXAM: CT ABDOMEN AND PELVIS WITHOUT CONTRAST TECHNIQUE: Multidetector CT imaging of the abdomen and pelvis was performed following the standard protocol without IV contrast. COMPARISON:  None. FINDINGS: Evaluation of this exam is limited in the absence of intravenous contrast. Lower chest: The visualized lung bases are  clear. No intra-abdominal free air or free fluid. Hepatobiliary: Diffuse fatty liver. No intrahepatic biliary ductal dilatation. The gallbladder is unremarkable. Pancreas: Unremarkable. No pancreatic ductal dilatation or surrounding inflammatory changes. Spleen: Normal in size without focal abnormality. Adrenals/Urinary Tract: Adrenal glands are unremarkable. Kidneys are normal, without renal calculi, focal lesion, or hydronephrosis. Bladder is unremarkable. Stomach/Bowel: Stomach is within normal limits. Appendix appears normal. No evidence of bowel wall thickening, distention, or inflammatory changes. Vascular/Lymphatic: Mild aortoiliac atherosclerotic disease. No portal venous gas. There is no adenopathy. Reproductive: The prostate and seminal vesicles are grossly unremarkable. No pelvic mass. Other: Small  pockets of subcutaneous air in the left anterior pelvic wall, likely related to recent injection. Musculoskeletal: L1 hemangioma.  No acute osseous pathology. IMPRESSION: 1. No acute intra-abdominopelvic pathology. No bowel obstruction or active inflammation. Normal appendix. 2. Fatty liver. Electronically Signed   By: Elgie Collard M.D.   On: 03/04/2018 22:35      Assessment & Plan: Pt is a 52 y.o. Caucasian  male with diabetes, hypertension, hyperlipidemia was admitted on 03/04/2018 with generalized weakness, abdominal pain, diarrhea, hypotension and acute renal failure  1.  Acute renal failure, likely multifactorial with factors including hypotension, ACE inhibitor in the setting of low blood pressure, nonsteroidal use Serum creatinine improved from 7.36-4.29 Clinically patient feels better.  Baseline creatinine 0.89 from February 09, 2018 Urinalysis is negative for blood.  Minimal protein.  0-5 RBCs.  11-20 WBCs.  Urine protein to creatinine ratio is 0.3 Plan: Continue IV hydration We will monitor renal panel.  Complete recovery is expected. 2. HTN -patient's blood pressure is low normal now.   He has lost some weight after he had dental work.  He has not been able to eat a normal diet.  This may have resulted in his blood pressure control getting back to normal. May discontinue lisinopril. Consider restarting in future if BP becomes elevated again    LOS: 1 Misty Rago 9/27/201911:28 AM  [facility] [place], [place] [phone]  Note: This note was prepared with Dragon dictation. Any transcription errors are unintentional

## 2018-03-06 LAB — COMPREHENSIVE METABOLIC PANEL
ALT: 35 U/L (ref 0–44)
AST: 37 U/L (ref 15–41)
Albumin: 3.4 g/dL — ABNORMAL LOW (ref 3.5–5.0)
Alkaline Phosphatase: 53 U/L (ref 38–126)
Anion gap: 7 (ref 5–15)
BUN: 19 mg/dL (ref 6–20)
CO2: 24 mmol/L (ref 22–32)
Calcium: 8 mg/dL — ABNORMAL LOW (ref 8.9–10.3)
Chloride: 108 mmol/L (ref 98–111)
Creatinine, Ser: 0.78 mg/dL (ref 0.61–1.24)
GFR calc Af Amer: >60 mL/min (ref >60)
GFR calc non Af Amer: >60 mL/min (ref >60)
Glucose, Bld: 85 mg/dL (ref 70–99)
Potassium: 4.5 mmol/L (ref 3.5–5.1)
Sodium: 139 mmol/L (ref 135–145)
Total Bilirubin: 1.2 mg/dL (ref 0.3–1.2)
Total Protein: 6.3 g/dL — ABNORMAL LOW (ref 6.5–8.1)

## 2018-03-06 LAB — HIV ANTIBODY (ROUTINE TESTING W REFLEX): HIV Screen 4th Generation wRfx: NONREACTIVE

## 2018-03-06 LAB — URIC ACID: Uric Acid, Serum: 7.4 mg/dL (ref 3.7–8.6)

## 2018-03-06 LAB — GLUCOSE, CAPILLARY: Glucose-Capillary: 77 mg/dL (ref 70–99)

## 2018-03-06 MED ORDER — PREDNISONE 5 MG PO TABS: ORAL_TABLET | ORAL | 0 refills | Status: DC

## 2018-03-06 NOTE — Progress Notes (Signed)
Patient cleared for discharge by Dr Renae Gloss. IV removed. Prescription and discharge instructions given. All patient questions answered. Patient driving self home.

## 2018-03-06 NOTE — Discharge Summary (Signed)
[facility] -  at [facility]   PATIENT NAME: Dean Clements    MR#:  [id number]  DATE OF BIRTH:  1965/11/03  DATE OF ADMISSION:  03/04/2018 ADMITTING PHYSICIAN: Milagros Loll, MD  DATE OF DISCHARGE: 03/06/2018  2:25 PM  PRIMARY CARE PHYSICIAN: Smitty Cords, DO    ADMISSION DIAGNOSIS:  Acute renal insufficiency [N28.9]  DISCHARGE DIAGNOSIS:  Active Problems:   AKI (acute kidney injury) (HCC)   SECONDARY DIAGNOSIS:   Past Medical History:  Diagnosis Date  . Depression   . Diabetes mellitus without complication (HCC)   . GERD (gastroesophageal reflux disease)   . Hyperlipidemia   . Hypertension   . Low back pain 2004   occasional back flares each year.     HOSPITAL COURSE:   1.  Acute kidney injury due to hypotension, ACE inhibitor and NSAIDs.  Creatinine improved back to normal with IV fluid hydration.  Patient's creatinine 7.36 and improved to 0.78 upon discharge home. 2.  Hypotension improved with IV fluids and holding lisinopril 3.  Type 2 diabetes mellitus.  Can go back on metformin as outpatient.  Hemoglobin A1c 5.8 can consider coming off the metformin as outpatient 4.  Gout left toe improved with dose of steroids will give quick prednisone taper 5.  Elevated liver function test.  Normalized with IV fluids   DISCHARGE CONDITIONS:   Satisfactory  CONSULTS OBTAINED:  Treatment Team:  Mosetta Pigeon, MD  DRUG ALLERGIES:  No Known Allergies  DISCHARGE MEDICATIONS:   Allergies as of 03/06/2018   No Known Allergies     Medication List    STOP taking these medications   ketorolac 60 MG/2ML Soln injection Commonly known as:  TORADOL   lisinopril 10 MG tablet Commonly known as:  PRINIVIL,ZESTRIL   naproxen sodium 220 MG tablet Commonly known as:  ALEVE   ondansetron 4 MG disintegrating tablet Commonly known as:  ZOFRAN-ODT     TAKE these medications   FLUoxetine 20 MG capsule Commonly known as:  PROZAC Take 1  capsule (20 mg total) by mouth 2 (two) times daily.   metFORMIN 500 MG tablet Commonly known as:  GLUCOPHAGE Take 1 tablet (500 mg total) by mouth 2 (two) times daily with a meal.   pantoprazole 40 MG tablet Commonly known as:  PROTONIX Take 1 tablet (40 mg total) by mouth daily.   predniSONE 5 MG tablet Commonly known as:  DELTASONE Two tabs po day1, 2; one tab po day3,4,5 Notes to patient:  Tapered dose. 9/29 2 pills, 9/30 2 pills, 10/01 one pill, 10/02 one pill, 10/03 one pill        DISCHARGE INSTRUCTIONS:   Follow-up PMD 5 days  If you experience worsening of your admission symptoms, develop shortness of breath, life threatening emergency, suicidal or homicidal thoughts you must seek medical attention immediately by calling 911 or calling your MD immediately  if symptoms less severe.  You Must read complete instructions/literature along with all the possible adverse reactions/side effects for all the Medicines you take and that have been prescribed to you. Take any new Medicines after you have completely understood and accept all the possible adverse reactions/side effects.   Please note  You were cared for by a hospitalist during your hospital stay. If you have any questions about your discharge medications or the care you received while you were in the hospital after you are discharged, you can call the unit and asked to speak with the hospitalist on call if  the hospitalist that took care of you is not available. Once you are discharged, your primary care physician will handle any further medical issues. Please note that NO REFILLS for any discharge medications will be authorized once you are discharged, as it is imperative that you return to your primary care physician (or establish a relationship with a primary care physician if you do not have one) for your aftercare needs so that they can reassess your need for medications and monitor your lab values.    Today   CHIEF  COMPLAINT:   Chief Complaint  Patient presents with  . Weakness  . Headache  . Hypotension    HISTORY OF PRESENT ILLNESS:  Dean Clements  is a 52 y.o. male presented with weakness and found to be in acute kidney injury   VITAL SIGNS:  Blood pressure 128/76, pulse 72, temperature (!) 97.5 F (36.4 C), temperature source Oral, resp. rate 18, height 5\' 11"  (1.803 m), weight 100.3 kg, SpO2 98 %.    PHYSICAL EXAMINATION:  GENERAL:  52 y.o.-year-old patient lying in the bed with no acute distress.  EYES: Pupils equal, round, reactive to light and accommodation. No scleral icterus. Extraocular muscles intact.  HEENT: Head atraumatic, normocephalic. Oropharynx and nasopharynx clear.  NECK:  Supple, no jugular venous distention. No thyroid enlargement, no tenderness.  LUNGS: Normal breath sounds bilaterally, no wheezing, rales,rhonchi or crepitation. No use of accessory muscles of respiration.  CARDIOVASCULAR: S1, S2 normal. No murmurs, rubs, or gallops.  ABDOMEN: Soft, non-tender, non-distended. Bowel sounds present. No organomegaly or mass.  EXTREMITIES: No pedal edema, cyanosis, or clubbing.  NEUROLOGIC: Cranial nerves II through XII are intact. Muscle strength 5/5 in all extremities. Sensation intact. Gait not checked.  PSYCHIATRIC: The patient is alert and oriented x 3.  SKIN: No obvious rash, lesion, or ulcer.   DATA REVIEW:   CBC Recent Labs  Lab 03/05/18 0503  WBC 6.8  HGB 15.0  HCT 42.2  PLT 135*    Chemistries  Recent Labs  Lab 03/06/18 0600  NA 139  K 4.5  CL 108  CO2 24  GLUCOSE 85  BUN 19  CREATININE 0.78  CALCIUM 8.0*  AST 37  ALT 35  ALKPHOS 53  BILITOT 1.2    Cardiac Enzymes Recent Labs  Lab 03/04/18 1723  TROPONINI <0.03      RADIOLOGY:  Ct Abdomen Pelvis Wo Contrast  Result Date: 03/04/2018 CLINICAL DATA:  52 year old male with abdominal pain. Concern for gastroenteritis versus colitis. EXAM: CT ABDOMEN AND PELVIS WITHOUT CONTRAST  TECHNIQUE: Multidetector CT imaging of the abdomen and pelvis was performed following the standard protocol without IV contrast. COMPARISON:  None. FINDINGS: Evaluation of this exam is limited in the absence of intravenous contrast. Lower chest: The visualized lung bases are clear. No intra-abdominal free air or free fluid. Hepatobiliary: Diffuse fatty liver. No intrahepatic biliary ductal dilatation. The gallbladder is unremarkable. Pancreas: Unremarkable. No pancreatic ductal dilatation or surrounding inflammatory changes. Spleen: Normal in size without focal abnormality. Adrenals/Urinary Tract: Adrenal glands are unremarkable. Kidneys are normal, without renal calculi, focal lesion, or hydronephrosis. Bladder is unremarkable. Stomach/Bowel: Stomach is within normal limits. Appendix appears normal. No evidence of bowel wall thickening, distention, or inflammatory changes. Vascular/Lymphatic: Mild aortoiliac atherosclerotic disease. No portal venous gas. There is no adenopathy. Reproductive: The prostate and seminal vesicles are grossly unremarkable. No pelvic mass. Other: Small pockets of subcutaneous air in the left anterior pelvic wall, likely related to recent injection. Musculoskeletal: L1 hemangioma.  No acute osseous pathology. IMPRESSION: 1. No acute intra-abdominopelvic pathology. No bowel obstruction or active inflammation. Normal appendix. 2. Fatty liver. Electronically Signed   By: Elgie Collard M.D.   On: 03/04/2018 22:35    Management plans discussed with the patient, and he is in agreement.  CODE STATUS:     Code Status Orders  (From admission, onward)         Start     Ordered   03/04/18 1955  Full code  Continuous     03/04/18 1956        Code Status History    This patient has a current code status but no historical code status.      TOTAL TIME TAKING CARE OF THIS PATIENT: 35 minutes.    Alford Highland M.D on 03/06/2018 at 4:36 PM  Between 7am to 6pm - Pager -  [phone]  After 6pm go to [email or web address] - Social research officer, government  [facility] Office  [phone]  CC: Primary care physician; Smitty Cords, DO

## 2018-03-08 ENCOUNTER — Telehealth: Payer: Self-pay

## 2018-03-08 NOTE — Telephone Encounter (Signed)
Transition Care Management Follow-up Telephone Call  Date of discharge and from where: 03/06/2018  How have you been since you were released from the hospital? "I am feeling much better,  I must have been pretty dehydrated"  Any questions or concerns? No   Items Reviewed:  Did the pt receive and understand the discharge instructions provided? Yes   Medications obtained and verified? Yes   Any new allergies since your discharge? No   Dietary orders reviewed? Yes  Do you have support at home? Yes   Functional Questionnaire: (I = Independent and D = Dependent) ADLs: I Bathing/Dressing- I  Meal Prep- I  Eating- I  Maintaining continence- I  Transferring/Ambulation- I  Managing Meds- I  Follow up appointments reviewed:   PCP Hospital f/u appt confirmed? No  patient preferred to call back to schedule as he was at work.   Specialist Hospital f/u appt confirmed?N/A  Are transportation arrangements needed? No   If their condition worsens, is the pt aware to call PCP or go to the Emergency Dept.? Yes  Was the patient provided with contact information for the PCP's office or ED? Yes  Was to pt encouraged to call back with questions or concerns? Yes

## 2018-06-04 ENCOUNTER — Telehealth: Payer: Self-pay | Admitting: Family Medicine

## 2018-06-04 DIAGNOSIS — G8929 Other chronic pain: Secondary | ICD-10-CM

## 2018-06-04 DIAGNOSIS — M5441 Lumbago with sciatica, right side: Principal | ICD-10-CM

## 2018-06-04 MED ORDER — GABAPENTIN 300 MG PO CAPS: 300.0000 mg | ORAL_CAPSULE | Freq: Two times a day (BID) | ORAL | 2 refills | Status: DC | PRN

## 2018-06-04 NOTE — Telephone Encounter (Signed)
Pt needs refill on gabapentin for back pain.  His call back number is [phone]

## 2018-06-04 NOTE — Telephone Encounter (Signed)
I do not see that we have prescribed Gabapentin for him. This is a good option for him, but I do not see a previous rx.  Can you call patient to find out who was last provider to prescribe Gabapentin and how much he is taking - dosage and how often?  Saralyn PilarAlexander Karamalegos, DO [facility] 06/04/2018, 3:52 PM

## 2018-06-04 NOTE — Telephone Encounter (Signed)
Left message for patient to call back  

## 2018-06-04 NOTE — Telephone Encounter (Signed)
Called patient, he states that he used to take gabapentin 200 or 300mg  twice daily PRN for back pain until it improved, then he tapered off and would stay off of it for a while until needed again. No recent rx, previous provider rx it for him. I sent new rx - we will discuss in office next time .  Saralyn PilarAlexander Jondavid Schreier, DO [facility] 06/04/2018, 5:22 PM

## 2018-06-14 ENCOUNTER — Ambulatory Visit (INDEPENDENT_AMBULATORY_CARE_PROVIDER_SITE_OTHER): Payer: PRIVATE HEALTH INSURANCE | Admitting: Family Medicine

## 2018-06-14 ENCOUNTER — Other Ambulatory Visit: Payer: Self-pay | Admitting: Family Medicine

## 2018-06-14 ENCOUNTER — Encounter: Payer: Self-pay | Admitting: Family Medicine

## 2018-06-14 VITALS — BP 169/106 | HR 97 | Temp 98.8°F | Resp 16 | Ht 71.0 in | Wt 220.6 lb

## 2018-06-14 DIAGNOSIS — M5441 Lumbago with sciatica, right side: Secondary | ICD-10-CM

## 2018-06-14 DIAGNOSIS — K219 Gastro-esophageal reflux disease without esophagitis: Secondary | ICD-10-CM

## 2018-06-14 DIAGNOSIS — E1169 Type 2 diabetes mellitus with other specified complication: Secondary | ICD-10-CM | POA: Insufficient documentation

## 2018-06-14 DIAGNOSIS — M10472 Other secondary gout, left ankle and foot: Secondary | ICD-10-CM

## 2018-06-14 DIAGNOSIS — I10 Essential (primary) hypertension: Secondary | ICD-10-CM

## 2018-06-14 DIAGNOSIS — M1A4720 Other secondary chronic gout, left ankle and foot, without tophus (tophi): Secondary | ICD-10-CM

## 2018-06-14 DIAGNOSIS — E669 Obesity, unspecified: Secondary | ICD-10-CM

## 2018-06-14 DIAGNOSIS — G8929 Other chronic pain: Secondary | ICD-10-CM

## 2018-06-14 DIAGNOSIS — E785 Hyperlipidemia, unspecified: Secondary | ICD-10-CM

## 2018-06-14 DIAGNOSIS — G4700 Insomnia, unspecified: Secondary | ICD-10-CM

## 2018-06-14 DIAGNOSIS — F419 Anxiety disorder, unspecified: Secondary | ICD-10-CM

## 2018-06-14 DIAGNOSIS — M109 Gout, unspecified: Secondary | ICD-10-CM | POA: Insufficient documentation

## 2018-06-14 MED ORDER — PREDNISONE 20 MG PO TABS: ORAL_TABLET | ORAL | 0 refills | Status: DC

## 2018-06-14 NOTE — Patient Instructions (Addendum)
Thank you for coming to the office today.  Ask pharmacist about the following medication cost: - Colchicine (or brand name Mitigare, or Colcrys)  Also check on the preventative medicine - Allopurinol  ----------------------------  Your Left toe pain is most likely caused an Acute Gout Flare - Gout is a chronic problem that will have episodic flare ups with pain, redness, swelling of a joint, most common spots are big toe, foot and ankle, knee or sometimes hands or wrists. It is caused by small crystals made of Uric Acid that form in the joint causing pain and swelling.  Start Prednisone course steroid to reduce pain and acute gout flare.  For all gout flares, the sooner you start the medication, then the shorter the flare lasts. Go ahead and start taking Aleve 250mg  x 2 pills per dose twice a day for 3 to 5 days to start if you get another acute flare, as soon as you get significant gout pain and swelling again in the future, and if it is not improving within 48 hours then you can follow-up at our office. OR if you don't start medication you can come to the office within 24-48 hours for treatment here.  Gout flares can repeat again soon after they resolve in the same spot or other joints, and may need repeat treatment.  Our goal is to prevent future gout flares. Try to avoid dietary triggers that are the most common causes of gout flares. - Avoid the following foods/drinks: - Red meat, organ meat (liver) - Alcohol (especially beer, also wine, liquor) - Processed foods / carbs (white bread, white rice, pasta, sugar) - Sugary drinks (sweet tea, soda) - Shellfish, shrimp / lobster  - Foods that are preferred to eat: - Beans, Lentils, Whole grains, Quinoa - Fruits, Vegetables - Dairy, Cheese, Yogurt - Soy based protein  We can do a blood test to check Uric Acid level, but the best way to confirm a diagnosis and help [place] determine the exact treatment you need is to drain the swelling and  analyze a sample of the fluid for crystals. We do not do this procedure here at our office, and would ask that you notify us or come in for evaluation and we can get you urgently scheduled with Orthopedics or Rheumatology office.  DUE for FASTING BLOOD WORK (no food or drink after midnight before the lab appointment, only water or coffee without cream/sugar on the morning of)  SCHEDULE "Lab Only" visit in the morning at the clinic for lab draw in 4-6 weeks  - Make sure Lab Only appointment is at about 1 week before your next appointment, so that results will be available  For Lab Results, once available within 2-3 days of blood draw, you can can log in to MyChart online to view your results and a brief explanation. Also, we can discuss results at next follow-up visit.    Please schedule a Follow-up Appointment to: Return in about 1 week (around 06/21/2018), or if symptoms worsen or fail to improve, for Follow-up - Check up and Gout - Lab results.  If you have any other questions or concerns, please feel free to call the office or send a message through MyChart. You may also schedule an earlier appointment if necessary.  Additionally, you may be receiving a survey about your experience at our office within a few days to 1 week by e-mail or mail. We value your feedback.  Saralyn Pilar, DO [facility], [place]  Low-Purine Eating Plan A low-purine eating plan involves making food choices to limit your intake of purine. Purine is a kind of uric acid. Too much uric acid in your blood can cause certain conditions, such as gout and kidney stones. Eating a low-purine diet can help control these conditions. What are tips for following this plan? Reading food labels   Avoid foods with saturated or Trans fat.  Check the ingredient list of grains-based foods, such as bread and cereal, to make sure that they contain whole grains.  Check the ingredient list of sauces or soups  to make sure they do not contain meat or fish.  When choosing soft drinks, check the ingredient list to make sure they do not contain high-fructose corn syrup. Shopping  Buy plenty of fresh fruits and vegetables.  Avoid buying canned or fresh fish.  Buy dairy products labeled as low-fat or nonfat.  Avoid buying premade or processed foods. These foods are often high in fat, salt (sodium), and added sugar. Cooking  Use olive oil instead of butter when cooking. Oils like olive oil, canola oil, and sunflower oil contain healthy fats. Meal planning  Learn which foods do or do not affect you. If you find out that a food tends to cause your gout symptoms to flare up, avoid eating that food. You can enjoy foods that do not cause problems. If you have any questions about a food item, talk with your dietitian or health care provider.  Limit foods high in fat, especially saturated fat. Fat makes it harder for your body to get rid of uric acid.  Choose foods that are lower in fat and are lean sources of protein. General guidelines  Limit alcohol intake to no more than 1 drink a day for nonpregnant women and 2 drinks a day for men. One drink equals 12 oz of beer, 5 oz of wine, or 1 oz of hard liquor. Alcohol can affect the way your body gets rid of uric acid.  Drink plenty of water to keep your urine clear or pale yellow. Fluids can help remove uric acid from your body.  If directed by your health care provider, take a vitamin C supplement.  Work with your health care provider and dietitian to develop a plan to achieve or maintain a healthy weight. Losing weight can help reduce uric acid in your blood. What foods are recommended? The items listed may not be a complete list. Talk with your dietitian about what dietary choices are best for you. Foods low in purines Foods low in purines do not need to be limited. These include:  All fruits.  All low-purine vegetables, pickles, and  olives.  Breads, pasta, rice, cornbread, and popcorn. Cake and other baked goods.  All dairy foods.  Eggs, nuts, and nut butters.  Spices and condiments, such as salt, herbs, and vinegar.  Plant oils, butter, and margarine.  Water, sugar-free soft drinks, tea, coffee, and cocoa.  Vegetable-based soups, broths, sauces, and gravies. Foods moderate in purines Foods moderate in purines should be limited to the amounts listed.   cup of asparagus, cauliflower, spinach, mushrooms, or green peas, each day.  2/3 cup uncooked oatmeal, each day.   cup dry wheat bran or wheat germ, each day.  2-3 ounces of meat or poultry, each day.  4-6 ounces of shellfish, such as crab, lobster, oysters, or shrimp, each day.  1 cup cooked beans, peas, or lentils, each day.  Soup, broths, or bouillon made from meat  or fish. Limit these foods as much as possible. What foods are not recommended? The items listed may not be a complete list. Talk with your dietitian about what dietary choices are best for you. Limit your intake of foods high in purines, including:  Beer and other alcohol.  Meat-based gravy or sauce.  Canned or fresh fish, such as: ? Anchovies, sardines, herring, and tuna. ? Mussels and scallops. ? Codfish, trout, and haddock.  Dean Clements.  Organ meats, such as: ? Liver or kidney. ? Tripe. ? Sweetbreads (thymus gland or pancreas).  Wild Education officer, environmental.  Yeast or yeast extract supplements.  Drinks sweetened with high-fructose corn syrup. Summary  Eating a low-purine diet can help control conditions caused by too much uric acid in the body, such as gout or kidney stones.  Choose low-purine foods, limit alcohol, and limit foods high in fat.  You will learn over time which foods do or do not affect you. If you find out that a food tends to cause your gout symptoms to flare up, avoid eating that food. This information is not intended to replace advice given to you by your health  care provider. Make sure you discuss any questions you have with your health care provider. Document Released: 09/20/2010 Document Revised: 07/09/2016 Document Reviewed: 07/09/2016 Elsevier Interactive Patient Education  2019 [employer].

## 2018-06-14 NOTE — Progress Notes (Signed)
Subjective:    Patient ID: Dean Clements, male    DOB: [birth year], 53 y.o.   MRN: [id number]  Dean Clements is a 53 y.o. male presenting on 06/14/2018 for Gout (Left toe onset last night, throbbing pain whole night yesterday)   HPI   ACUTE GOUT, Left great toe/MTP Reports symptoms started about 24 hours ago, last night with significant moderate to severe pain in Left Great Toe, described swelling localized at MTP joint and very sensitive to touch even with sock or shoe. Pain worse with ambulation. He tried Aleve with limited benefit initially. - He also admits some mild L great toe pain about 3 weeks ago was very mild and not affecting him, he did not think much of it at the time - he admits dietary trigger likely alcohol intake may have caused gout, he has tried to reduce alcohol - History of Gout more recently first episode 02/2018 in hospital with AKI episode, he had lab uric acid 7.4, no prior comparison, he was dx with L gout flare in toe with pain and swelling, treated with prednisone dose pak taper - Last lab test with Cr normalized 0.7 back in 02/2018 after hospitalization - Additional incidental accident recently with Christmas tree decorations scratched him on face and arm when attempting to put them away, did not fall and did not injure his toe - Denied any foot or toe injury, spreading redness, fever or chills, ulceration, numbness or tingling   Depression screen [facility] 2/9 06/14/2018 11/17/2017 01/15/2015  Decreased Interest 0 0 0  Down, Depressed, Hopeless 0 0 0  PHQ - 2 Score 0 0 0  Altered sleeping - 3 -  Tired, decreased energy - 0 -  Change in appetite - 0 -  Feeling bad or failure about yourself  - 0 -  Trouble concentrating - 0 -  Moving slowly or fidgety/restless - 0 -  Suicidal thoughts - 0 -  PHQ-9 Score - 3 -  Difficult doing work/chores - Somewhat difficult -    Social History   Tobacco Use  . Smoking status: Former Smoker    Packs/day: 0.20      Years: 3.00    Pack years: 0.60    Types: Cigarettes  . Smokeless tobacco: Never Used  Substance Use Topics  . Alcohol use: Yes    Alcohol/week: 28.0 standard drinks    Types: 28 Cans of beer per week  . Drug use: No    Review of Systems Per HPI unless specifically indicated above     Objective:    BP (!) 169/106   Pulse 97   Temp 98.8 F (37.1 C) (Oral)   Resp 16   Ht 5\' 11"  (1.803 m)   Wt 220 lb 9.6 oz (100.1 kg)   BMI 30.77 kg/m   Wt Readings from Last 3 Encounters:  06/14/18 220 lb 9.6 oz (100.1 kg)  03/05/18 221 lb 1.9 oz (100.3 kg)  03/04/18 221 lb 9.6 oz (100.5 kg)    Physical Exam Vitals signs and nursing note reviewed.  Constitutional:      General: He is not in acute distress.    Appearance: He is well-developed. He is not diaphoretic.     Comments: Well-appearing, comfortable, cooperative  HENT:     Head: Normocephalic and atraumatic.  Eyes:     General:        Right eye: No discharge.        Left eye: No discharge.  Conjunctiva/sclera: Conjunctivae normal.  Cardiovascular:     Rate and Rhythm: Normal rate.  Pulmonary:     Effort: Pulmonary effort is normal.  Musculoskeletal:     Comments: Left Great Toe / MTP joint with moderate edema, localized erythema and tender to light touch. Able to ambulate with pain, has antalgic gait. No other tenderness without foot or ankle, no other deformity, no extending erythema, no ulceration.  Skin:    General: Skin is warm and dry.     Findings: No erythema or rash.  Neurological:     Mental Status: He is alert and oriented to person, place, and time.  Psychiatric:        Behavior: Behavior normal.     Comments: Well groomed, good eye contact, normal speech and thoughts    Results for orders placed or performed during the hospital encounter of 03/04/18  Comprehensive metabolic panel  Result Value Ref Range   Sodium 130 (L) 135 - 145 mmol/L   Potassium 4.7 3.5 - 5.1 mmol/L   Chloride 95 (L) 98 - 111  mmol/L   CO2 20 (L) 22 - 32 mmol/L   Glucose, Bld 120 (H) 70 - 99 mg/dL   BUN 42 (H) 6 - 20 mg/dL   Creatinine, Ser 6.267.36 (H) 0.61 - 1.24 mg/dL   Calcium 8.8 (L) 8.9 - 10.3 mg/dL   Total Protein 7.5 6.5 - 8.1 g/dL   Albumin 4.2 3.5 - 5.0 g/dL   AST 52 (H) 15 - 41 U/L   ALT 45 (H) 0 - 44 U/L   Alkaline Phosphatase 69 38 - 126 U/L   Total Bilirubin 1.4 (H) 0.3 - 1.2 mg/dL   GFR calc non Af Amer 8 (L) >60 mL/min   GFR calc Af Amer 9 (L) >60 mL/min   Anion gap 15 5 - 15  CBC with Differential  Result Value Ref Range   WBC 11.2 (H) 3.8 - 10.6 K/uL   RBC 4.91 4.40 - 5.90 MIL/uL   Hemoglobin 17.1 13.0 - 18.0 g/dL   HCT 94.848.9 54.640.0 - 27.052.0 %   MCV 99.6 80.0 - 100.0 fL   MCH 34.8 (H) 26.0 - 34.0 pg   MCHC 34.9 32.0 - 36.0 g/dL   RDW 35.013.3 09.311.5 - 81.814.5 %   Platelets 214 150 - 440 K/uL   Neutrophils Relative % 66 %   Neutro Abs 7.4 (H) 1.4 - 6.5 K/uL   Lymphocytes Relative 18 %   Lymphs Abs 2.1 1.0 - 3.6 K/uL   Monocytes Relative 12 %   Monocytes Absolute 1.4 (H) 0.2 - 1.0 K/uL   Eosinophils Relative 3 %   Eosinophils Absolute 0.4 0 - 0.7 K/uL   Basophils Relative 1 %   Basophils Absolute 0.1 0 - 0.1 K/uL  Lactic acid, plasma  Result Value Ref Range   Lactic Acid, Venous 1.0 0.5 - 1.9 mmol/L  Lactic acid, plasma  Result Value Ref Range   Lactic Acid, Venous 0.9 0.5 - 1.9 mmol/L  Lipase, blood  Result Value Ref Range   Lipase 51 11 - 51 U/L  Urinalysis, Complete w Microscopic  Result Value Ref Range   Color, Urine YELLOW (A) YELLOW   APPearance HAZY (A) CLEAR   Specific Gravity, Urine 1.015 1.005 - 1.030   pH 5.0 5.0 - 8.0   Glucose, UA NEGATIVE NEGATIVE mg/dL   Hgb urine dipstick NEGATIVE NEGATIVE   Bilirubin Urine NEGATIVE NEGATIVE   Ketones, ur NEGATIVE NEGATIVE mg/dL   Protein,  ur 30 (A) NEGATIVE mg/dL   Nitrite NEGATIVE NEGATIVE   Leukocytes, UA NEGATIVE NEGATIVE   RBC / HPF 0-5 0 - 5 RBC/hpf   WBC, UA 11-20 0 - 5 WBC/hpf   Bacteria, UA NONE SEEN NONE SEEN   Squamous  Epithelial / LPF 0-5 0 - 5   Mucus PRESENT    Hyaline Casts, UA PRESENT   Troponin I  Result Value Ref Range   Troponin I <0.03 <0.03 ng/mL  HIV antibody (Routine Testing)  Result Value Ref Range   HIV Screen 4th Generation wRfx Non Reactive Non Reactive  Hemoglobin A1c  Result Value Ref Range   Hgb A1c MFr Bld 5.8 (H) 4.8 - 5.6 %   Mean Plasma Glucose 119.76 mg/dL  Basic metabolic panel  Result Value Ref Range   Sodium 134 (L) 135 - 145 mmol/L   Potassium 5.0 3.5 - 5.1 mmol/L   Chloride 103 98 - 111 mmol/L   CO2 24 22 - 32 mmol/L   Glucose, Bld 92 70 - 99 mg/dL   BUN 40 (H) 6 - 20 mg/dL   Creatinine, Ser 1.614.29 (H) 0.61 - 1.24 mg/dL   Calcium 7.5 (L) 8.9 - 10.3 mg/dL   GFR calc non Af Amer 15 (L) >60 mL/min   GFR calc Af Amer 17 (L) >60 mL/min   Anion gap 7 5 - 15  CBC  Result Value Ref Range   WBC 6.8 3.8 - 10.6 K/uL   RBC 4.22 (L) 4.40 - 5.90 MIL/uL   Hemoglobin 15.0 13.0 - 18.0 g/dL   HCT 09.642.2 04.540.0 - 40.952.0 %   MCV 99.8 80.0 - 100.0 fL   MCH 35.5 (H) 26.0 - 34.0 pg   MCHC 35.5 32.0 - 36.0 g/dL   RDW 81.113.4 91.411.5 - 78.214.5 %   Platelets 135 (L) 150 - 440 K/uL  TSH  Result Value Ref Range   TSH 0.666 0.350 - 4.500 uIU/mL  Protein / creatinine ratio, urine  Result Value Ref Range   Creatinine, Urine 244 mg/dL   Total Protein, Urine 72 mg/dL   Protein Creatinine Ratio 0.30 (H) 0.00 - 0.15 mg/mg[Cre]  Sodium, urine, random  Result Value Ref Range   Sodium, Ur 32 mmol/L  Creatinine, urine, random  Result Value Ref Range   Creatinine, Urine 227 mg/dL  Glucose, capillary  Result Value Ref Range   Glucose-Capillary 95 70 - 99 mg/dL   Comment 1 Notify RN   Glucose, capillary  Result Value Ref Range   Glucose-Capillary 92 70 - 99 mg/dL  Glucose, capillary  Result Value Ref Range   Glucose-Capillary 99 70 - 99 mg/dL  Glucose, capillary  Result Value Ref Range   Glucose-Capillary 158 (H) 70 - 99 mg/dL  Uric acid  Result Value Ref Range   Uric Acid, Serum 7.4 3.7 - 8.6  mg/dL  Comprehensive metabolic panel  Result Value Ref Range   Sodium 139 135 - 145 mmol/L   Potassium 4.5 3.5 - 5.1 mmol/L   Chloride 108 98 - 111 mmol/L   CO2 24 22 - 32 mmol/L   Glucose, Bld 85 70 - 99 mg/dL   BUN 19 6 - 20 mg/dL   Creatinine, Ser 9.560.78 0.61 - 1.24 mg/dL   Calcium 8.0 (L) 8.9 - 10.3 mg/dL   Total Protein 6.3 (L) 6.5 - 8.1 g/dL   Albumin 3.4 (L) 3.5 - 5.0 g/dL   AST 37 15 - 41 U/L   ALT 35 0 - 44  U/L   Alkaline Phosphatase 53 38 - 126 U/L   Total Bilirubin 1.2 0.3 - 1.2 mg/dL   GFR calc non Af Amer >60 >60 mL/min   GFR calc Af Amer >60 >60 mL/min   Anion gap 7 5 - 15  Glucose, capillary  Result Value Ref Range   Glucose-Capillary 143 (H) 70 - 99 mg/dL  Glucose, capillary  Result Value Ref Range   Glucose-Capillary 77 70 - 99 mg/dL      Assessment & Plan:   Problem List Items Addressed This Visit    None    Visit Diagnoses    Acute gout due to other secondary cause involving toe of left foot    -  Primary   Relevant Medications   predniSONE (DELTASONE) 20 MG tablet      Clinically consistent with acute gout flare of Left great toe MTP, onset < 24 hours - admits alcohol intake may be trigger Last flare in hospital 02/2018 with AKI as likely renal source Differential Dx - unlikely trauma without known inciting injury, unlikely OA, no sign of infection or systemic symptoms. - Never on uric acid lowering therapy - Last uric acid level 7.4 (02/2018)  Plan: 1. Start Prednisone taper over 7 days - has worked for him before - questions on cost/coverage of colchicine, prefer more definitive treatment, he will check w/ pharmacy cost of colchicine in future. - Also discussed prophylaxis - if recurrent flares may consider Allopurinol - If recurrence can try OTC Aleve 250 x 2 per dose BID for short term 3-5 days only - caution history of AKI 3. Avoid excessive ambulation, relative rest, ice if helps, can take Tylenol PRN 4. Avoid food triggers (red meat, alcohol) -  handout given 5. Follow-up within 4 weeks once acute flare resolved, discuss uric acid lower therapy and prevention  Additionally will run other panel of labs for routine follow-up he is overdue for   Meds ordered this encounter  Medications  . predniSONE (DELTASONE) 20 MG tablet    Sig: Take daily with food. Start with 60mg  (3 pills) x 2 days, then reduce to 40mg  (2 pills) x 2 days, then 20mg  (1 pill) x 3 days    Dispense:  13 tablet    Refill:  0     Follow up plan: Return in about 1 week (around 06/21/2018), or if symptoms worsen or fail to improve, for Follow-up - Check up and Gout - Lab results.  Future labs ordered for 07/2018  Saralyn Pilar, DO [facility] 06/14/2018, 4:13 PM

## 2018-07-20 ENCOUNTER — Telehealth: Payer: Self-pay | Admitting: Family Medicine

## 2018-07-20 DIAGNOSIS — M10472 Other secondary gout, left ankle and foot: Secondary | ICD-10-CM

## 2018-07-20 MED ORDER — PREDNISONE 20 MG PO TABS: ORAL_TABLET | ORAL | 0 refills | Status: DC

## 2018-07-20 NOTE — Telephone Encounter (Signed)
Pt asked for a refill on prednisone be sent to [facility] for gout.  His number is [phone]

## 2018-07-20 NOTE — Telephone Encounter (Signed)
Agreed to send 1 re order Prednisone taper for gout  Follow-up if not improved  Saralyn Pilar, DO [facility] 07/20/2018, 5:04 PM

## 2018-07-30 ENCOUNTER — Other Ambulatory Visit: Payer: Self-pay | Admitting: Family Medicine

## 2018-07-30 DIAGNOSIS — M5441 Lumbago with sciatica, right side: Principal | ICD-10-CM

## 2018-07-30 DIAGNOSIS — G8929 Other chronic pain: Secondary | ICD-10-CM

## 2018-08-10 ENCOUNTER — Other Ambulatory Visit: Payer: Self-pay | Admitting: Family Medicine

## 2018-08-10 DIAGNOSIS — K219 Gastro-esophageal reflux disease without esophagitis: Secondary | ICD-10-CM

## 2018-08-10 DIAGNOSIS — M10472 Other secondary gout, left ankle and foot: Secondary | ICD-10-CM

## 2018-08-10 NOTE — Telephone Encounter (Signed)
Patient needs an office visit for further Prednisone orders. He has already had this once a month for past 2 months, in January 2020 and February 2020.  He cannot take Prednisone long term due to risk of side effects.  We will need to discuss preventative medicine for gout, and consider a referral to Rheumatologist or Gout specialist if not improving.  Saralyn Pilar, DO [facility] 08/10/2018, 5:59 PM

## 2018-08-10 NOTE — Telephone Encounter (Signed)
Pt needs a refill on prednisone sent to [facility].   His call back [phone]

## 2018-08-11 NOTE — Telephone Encounter (Signed)
Patient has scheduled an appointment 

## 2018-08-11 NOTE — Telephone Encounter (Signed)
Left message for patient to call back  

## 2018-08-13 ENCOUNTER — Ambulatory Visit: Payer: PRIVATE HEALTH INSURANCE | Admitting: Family Medicine

## 2019-01-17 ENCOUNTER — Encounter: Payer: Self-pay | Admitting: Family Medicine

## 2019-01-17 ENCOUNTER — Other Ambulatory Visit: Payer: Self-pay

## 2019-01-17 ENCOUNTER — Ambulatory Visit (INDEPENDENT_AMBULATORY_CARE_PROVIDER_SITE_OTHER): Payer: No Typology Code available for payment source | Admitting: Family Medicine

## 2019-01-17 DIAGNOSIS — J019 Acute sinusitis, unspecified: Secondary | ICD-10-CM

## 2019-01-17 DIAGNOSIS — J029 Acute pharyngitis, unspecified: Secondary | ICD-10-CM | POA: Diagnosis not present

## 2019-01-17 MED ORDER — AMOXICILLIN 500 MG PO CAPS: 500.0000 mg | ORAL_CAPSULE | Freq: Two times a day (BID) | ORAL | 0 refills | Status: DC

## 2019-01-17 MED ORDER — FLUTICASONE PROPIONATE 50 MCG/ACT NA SUSP: 2.0000 | Freq: Every day | NASAL | 3 refills | Status: DC

## 2019-01-17 NOTE — Progress Notes (Signed)
Virtual Visit via Telephone The purpose of this virtual visit is to provide medical care while limiting exposure to the novel coronavirus (COVID19) for both patient and office staff.  Consent was obtained for phone visit:  Yes.   Answered questions that patient had about telehealth interaction:  Yes.   I discussed the limitations, risks, security and privacy concerns of performing an evaluation and management service by telephone. I also discussed with the patient that there may be a patient responsible charge related to this service. The patient expressed understanding and agreed to proceed.  Patient Location: Home [facility]   ---------------------------------------------------------------------- Chief Complaint  Patient presents with  . Sore Throat    onset week, cough denies fever, SOB but feels exhausted    S: Reviewed CMA documentation. I have called patient and gathered additional HPI as follows:  SORE THROAT Reports that symptoms started 1 week ago worse to touch on outside, not improved but not really worse, he has some productive cough with phlegm, some mucus. He tried OTC mucinex, alka seltzer cold medicine since Wednesday  Patient currently mostly at home but does wear mask if out in public.  Denies any high risk travel to areas of current concern for COVID19. Denies any known or suspected exposure to person with or possibly with COVID19.  Admits fatigue tired, worse cough at night when laying down Admits sinus pressure and congestion Denies any fevers, chills, sweats, body ache, shortness of breath, headache, abdominal pain, diarrhea  -------------------------------------------------------------------------- O: No physical exam performed due to remote telephone encounter.  -------------------------------------------------------------------------- A&P:  Suspected Acute Sinusitis, possible for benign viral etiology at onset  - now concern with progression of symptoms, consider 2nd sickening and cannot rule out bacterial infection with duration > 1 week.  Also cannot rule out COVID19 based on some symptoms   Reassuring without high risk symptoms - Afebrile, without dyspnea - No comorbid pulmonary conditions (asthma, COPD) or immunocompromise   1. Empiric coverage Amoxicillin 500 BID x 7 days 2. Start nasal steroid Flonase 2 sprays in each nostril daily for 4-6 weeks, may repeat course seasonally or as needed He may proceed to COVID19 testing [facility] - info given, no order required. OTC medications recommended for symptoms. Recommend Tylenol PRN for fever or other viral symptoms  No orders of the defined types were placed in this encounter.   REQUIRED self quarantine to AVOID POTENTIAL SPREAD - advised to avoid all exposure with others while during TESTING (Pending result) and treatment. Should continue to quarantine for up to 7-14 days - pending resolution of symptoms, if TEST IS NEGATIVE and symptoms resolve by 7 days and is afebrile >3 days - may STOP self quarantine at that time. IF test is POSITIVE then will require 10 additional day quarantine after date of positive test result.    If symptoms do not resolve or significantly improve OR if WORSENING - fever / cough - or worsening shortness of breath - then should contact us and seek advice on next steps in treatment at home vs where/when to seek care at Urgent Care or Hospital ED for further intervention and possible testing if indicated.  Patient verbalizes understanding with the above medical recommendations including the limitation of remote medical advice.  Specific follow-up / call-back criteria were given for patient to follow-up or seek medical care more urgently if needed.   - Time spent in direct consultation with patient on phone: 9 minutes  Saralyn PilarAlexander Trevin Gartrell,  [facility] 01/17/2019, 4:32 PM

## 2019-01-17 NOTE — Patient Instructions (Addendum)
Start Amoxicillin for sinuses  Start nasal steroid Flonase 2 sprays in each nostril daily for 4-6 weeks, may repeat course seasonally or as needed  -------------------   You may have coronavirus / [place] Testing Information  I have placed an order in the [facility] system.  All you need to do is arrive at a testing site. No appointment needed.  Hours (Open 8 a.m. - 3:45 p.m.) LAST TEST completed at 3:30pm  [place]: [facility] at [facility], [address] [place], [place]: [place], [place], [place], [place] (entrance off [address])  [place]: [place]. Main [address] [place], [place] (across from [facility] Emergency Department)  Test result may take 2-7 days to result. You will be notified by MyChart or by Phone.  Phone: [phone] [facility] contact, can inquire about status of test result)  If negative test - they will call you with result. If abnormal or positive test you will be notified as well and our office will contact you to help further with treatment plan.  May take Tylenol as needed for aches pains and fever. Prefer to avoid Ibuprofen if can help it, to avoid complication from virus.  REQUIRED self quarantine to [place] - advised to avoid all exposure with others while during treatment. Should continue to quarantine for up to 7-14 days, pending resolution of symptoms, if symptoms resolve by 7 days and is afebrile >3 days - may STOP self quarantine at that time.  If symptoms do not resolve or significantly improve OR if WORSENING - fever / cough - or worsening shortness of breath - then should contact us and seek advice on next steps in treatment at home vs where/when to seek care at Urgent Care or Hospital ED for further intervention   Please schedule a Follow-up Appointment to: Return in about 2 weeks (around 01/31/2019), or if symptoms worsen or  fail to improve, for sinus.  If you have any other questions or concerns, please feel free to call the office or send a message through [place]. You may also schedule an earlier appointment if necessary.  Additionally, you may be receiving a survey about your experience at our office within a few days to 1 week by e-mail or mail. We value your feedback.  Nobie Putnam, DO [place]

## 2019-02-12 ENCOUNTER — Other Ambulatory Visit: Payer: Self-pay | Admitting: Family Medicine

## 2019-02-12 DIAGNOSIS — K219 Gastro-esophageal reflux disease without esophagitis: Secondary | ICD-10-CM

## 2019-02-16 ENCOUNTER — Telehealth: Payer: Self-pay | Admitting: Family Medicine

## 2019-02-16 ENCOUNTER — Other Ambulatory Visit: Payer: Self-pay

## 2019-02-16 DIAGNOSIS — K219 Gastro-esophageal reflux disease without esophagitis: Secondary | ICD-10-CM

## 2019-02-16 MED ORDER — PANTOPRAZOLE SODIUM 40 MG PO TBEC: 40.0000 mg | DELAYED_RELEASE_TABLET | Freq: Every day | ORAL | 3 refills | Status: DC

## 2019-02-16 NOTE — Telephone Encounter (Signed)
Sent to pharmacy LM

## 2019-02-16 NOTE — Telephone Encounter (Signed)
Pt called requesting refill on Protonix 40 mg called into [facility]

## 2019-02-17 ENCOUNTER — Other Ambulatory Visit: Payer: Self-pay | Admitting: Family Medicine

## 2019-02-17 DIAGNOSIS — E1169 Type 2 diabetes mellitus with other specified complication: Secondary | ICD-10-CM

## 2019-02-27 ENCOUNTER — Other Ambulatory Visit: Payer: Self-pay | Admitting: Family Medicine

## 2019-02-27 DIAGNOSIS — F419 Anxiety disorder, unspecified: Secondary | ICD-10-CM

## 2019-04-06 ENCOUNTER — Other Ambulatory Visit: Payer: Self-pay | Admitting: Family Medicine

## 2019-04-06 DIAGNOSIS — G8929 Other chronic pain: Secondary | ICD-10-CM

## 2019-04-06 MED ORDER — GABAPENTIN 300 MG PO CAPS: 300.0000 mg | ORAL_CAPSULE | Freq: Two times a day (BID) | ORAL | 2 refills | Status: DC | PRN

## 2019-04-06 NOTE — Telephone Encounter (Signed)
Pt  Called requesting refill on his gout medication

## 2019-04-07 DIAGNOSIS — M109 Gout, unspecified: Secondary | ICD-10-CM | POA: Diagnosis not present

## 2019-04-12 ENCOUNTER — Ambulatory Visit: Payer: PRIVATE HEALTH INSURANCE | Admitting: Family Medicine

## 2019-05-25 DIAGNOSIS — H5203 Hypermetropia, bilateral: Secondary | ICD-10-CM | POA: Diagnosis not present

## 2019-05-25 LAB — HM DIABETES EYE EXAM

## 2019-05-26 ENCOUNTER — Encounter: Payer: Self-pay | Admitting: Family Medicine

## 2019-05-30 ENCOUNTER — Encounter: Payer: Self-pay | Admitting: Family Medicine

## 2019-05-30 DIAGNOSIS — E113299 Type 2 diabetes mellitus with mild nonproliferative diabetic retinopathy without macular edema, unspecified eye: Secondary | ICD-10-CM | POA: Insufficient documentation

## 2019-06-07 DIAGNOSIS — H5213 Myopia, bilateral: Secondary | ICD-10-CM | POA: Diagnosis not present

## 2019-06-29 DIAGNOSIS — H5203 Hypermetropia, bilateral: Secondary | ICD-10-CM | POA: Diagnosis not present

## 2019-07-22 ENCOUNTER — Other Ambulatory Visit: Payer: Self-pay | Admitting: Family Medicine

## 2019-07-22 DIAGNOSIS — G8929 Other chronic pain: Secondary | ICD-10-CM

## 2019-07-22 MED ORDER — KETOROLAC TROMETHAMINE 60 MG/2ML IM SOLN: 60.0000 mg | Freq: Once | INTRAMUSCULAR | 1 refills | Status: DC | PRN

## 2019-07-22 NOTE — Telephone Encounter (Signed)
The pt was notified and he verbalize understanding, no questions or concerns. 

## 2019-07-22 NOTE — Telephone Encounter (Signed)
The pt called requesting a prescription for Toradol 60MG /61ml solution for him to administer to himself for chronic back pain issues. He state that he has intermittent back flare ups that require him to take toradol.3m He said his back pain just started to flare up again and he want to administer the medication before it gets out of hand.  I informed the patient that he needs an appt to discuss his back issues because it's been years since you addressed his back pain. He declined the appt stating it really not much that you can do right now. Please advise

## 2019-07-22 NOTE — Telephone Encounter (Signed)
We used to agree to provide Toradol injection for him only once in a while as needed. Yes it has been >1 year since last evaluation of his back pain.  Because it is Friday and we are booked with apt already. I will agree to offer one rx of Toradol and sent it already to [facility] for now.  He would need an apt to discuss back pain further to authorize any other refills in future.  Saralyn Pilar, DO [facility] 07/22/2019, 2:02 PM

## 2019-07-22 NOTE — Telephone Encounter (Signed)
Pt stopped by office requesting refill on Toradol called into [facility]

## 2019-09-06 ENCOUNTER — Ambulatory Visit: Payer: Medicaid Other | Admitting: Family Medicine

## 2019-09-07 ENCOUNTER — Ambulatory Visit: Payer: Medicaid Other | Admitting: Family Medicine

## 2019-09-07 ENCOUNTER — Ambulatory Visit (INDEPENDENT_AMBULATORY_CARE_PROVIDER_SITE_OTHER): Payer: Medicaid Other | Admitting: Family Medicine

## 2019-09-07 ENCOUNTER — Encounter: Payer: Self-pay | Admitting: Family Medicine

## 2019-09-07 ENCOUNTER — Other Ambulatory Visit: Payer: Self-pay

## 2019-09-07 VITALS — BP 131/81 | HR 81 | Temp 97.5°F | Resp 16 | Ht 71.0 in | Wt 240.6 lb

## 2019-09-07 DIAGNOSIS — E669 Obesity, unspecified: Secondary | ICD-10-CM | POA: Diagnosis not present

## 2019-09-07 DIAGNOSIS — M5441 Lumbago with sciatica, right side: Secondary | ICD-10-CM | POA: Diagnosis not present

## 2019-09-07 DIAGNOSIS — G8929 Other chronic pain: Secondary | ICD-10-CM

## 2019-09-07 DIAGNOSIS — I10 Essential (primary) hypertension: Secondary | ICD-10-CM | POA: Diagnosis not present

## 2019-09-07 DIAGNOSIS — M4726 Other spondylosis with radiculopathy, lumbar region: Secondary | ICD-10-CM | POA: Insufficient documentation

## 2019-09-07 DIAGNOSIS — Z79899 Other long term (current) drug therapy: Secondary | ICD-10-CM | POA: Diagnosis not present

## 2019-09-07 DIAGNOSIS — M5442 Lumbago with sciatica, left side: Secondary | ICD-10-CM | POA: Diagnosis not present

## 2019-09-07 MED ORDER — KETOROLAC TROMETHAMINE 60 MG/2ML IM SOLN: 60.0000 mg | Freq: Once | INTRAMUSCULAR | 1 refills | Status: DC | PRN

## 2019-09-07 MED ORDER — GABAPENTIN 600 MG PO TABS: 1200.0000 mg | ORAL_TABLET | Freq: Two times a day (BID) | ORAL | 2 refills | Status: DC

## 2019-09-07 NOTE — Patient Instructions (Addendum)
Thank you for coming to the office today.  Back pain Referral to [facility] - stay tuned for apt.   [facility]IC[facility] [address] [place], [place]  [address] Phone: [phone]  Increased gabapentin from 300 up to 600mg  capsules - take TWO twice a day (1200) may take 1 extra in PM if need.  Refilled Toradol   Please schedule a Follow-up Appointment to: Return if symptoms worsen or fail to improve, for back pain if not improved.  If you have any other questions or concerns, please feel free to call the office or send a message through MyChart. You may also schedule an earlier appointment if necessary.  Additionally, you may be receiving a survey about your experience at our office within a few days to 1 week by e-mail or mail. We value your feedback.  , DO [facility], [facility]
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
## 2019-09-08 LAB — BASIC METABOLIC PANEL WITH GFR
BUN: 10 mg/dL (ref 7–25)
CO2: 29 mmol/L (ref 20–32)
Calcium: 10 mg/dL (ref 8.6–10.3)
Chloride: 101 mmol/L (ref 98–110)
Creat: 0.84 mg/dL (ref 0.70–1.33)
GFR, Est African American: 116 mL/min/{1.73_m2} (ref >=60)
GFR, Est Non African American: 100 mL/min/{1.73_m2} (ref >=60)
Glucose, Bld: 237 mg/dL — ABNORMAL HIGH (ref 65–139)
Potassium: 3.8 mmol/L (ref 3.5–5.3)
Sodium: 136 mmol/L (ref 135–146)

## 2019-09-12 ENCOUNTER — Telehealth: Payer: Self-pay

## 2019-09-12 NOTE — Telephone Encounter (Signed)
Looks like Ortho referral is in process and hopefully will be scheduled soon.  Last visit I increased his Gabapentin and re ordered his Toradol injection.  I don't have much else to offer for Arthritis joint pain.  He can try topical Voltaren gel OTC if needed 2-3 times a day on joints affected.  I can offer prednisone taper again but he has not had good results on it and also Ortho may do steroid injection so I would be cautious doing this now before his apt.  Saralyn Pilar, DO [facility] 09/12/2019, 1:34 PM

## 2019-09-12 NOTE — Telephone Encounter (Signed)
Patient advised.

## 2019-09-12 NOTE — Telephone Encounter (Signed)
Patient called reporting that he had not heard from the Ortho Referral.  I updated him that the referral was faxed on 09/07/19 and [facility] will have to approve the referral first.    He also wanted to know if you could call him in medication for joint pain/ arthritis.

## 2019-10-27 ENCOUNTER — Telehealth: Payer: Self-pay | Admitting: Family Medicine

## 2019-10-27 DIAGNOSIS — M5442 Lumbago with sciatica, left side: Secondary | ICD-10-CM

## 2019-10-27 DIAGNOSIS — G8929 Other chronic pain: Secondary | ICD-10-CM

## 2019-10-27 DIAGNOSIS — M4726 Other spondylosis with radiculopathy, lumbar region: Secondary | ICD-10-CM

## 2019-10-27 NOTE — Telephone Encounter (Signed)
Pt called stating that his medication gabapentin he has already run out of it. Pt is requesting to have a refill with the dose increased. Please advise.     [facility] - [place], [place] - [address]  [address] [place] [address]  Phone: [phone] Fax: [phone]  Not a 24 hour pharmacy; exact hours not known.

## 2019-10-28 ENCOUNTER — Other Ambulatory Visit: Payer: Self-pay | Admitting: Family Medicine

## 2019-10-28 DIAGNOSIS — G8929 Other chronic pain: Secondary | ICD-10-CM

## 2019-10-28 DIAGNOSIS — M5442 Lumbago with sciatica, left side: Secondary | ICD-10-CM

## 2019-10-28 DIAGNOSIS — M4726 Other spondylosis with radiculopathy, lumbar region: Secondary | ICD-10-CM

## 2019-10-28 NOTE — Telephone Encounter (Signed)
Can you call patient to clarify his dose request?  He was on a 300mg  pill in the past taking 300mg  3 times a day.  Now since last visit 09/07/19, he was changed to a 600mg  capsule.  He was advised to take 600mg  x 2 = 1200 - twice a day or 2400.  He was also advised he can take 1 extra 600mg  which would equal 5 pills total in 24 hours or 3000 mg total dose  If he is taking 5 pills a day, that is the maximum dose.  He should have received 150 pills which is a 30 day supply. Should not have run out early  We can try for a 90 day supply if he prefers, but he shouldn't take more than 5 pills a day or 150 pills a month.  Thanks  , DO [facility] 10/28/2019, 2:15 PM

## 2019-10-28 NOTE — Telephone Encounter (Signed)
Left message for patient to call back  

## 2019-11-02 NOTE — Telephone Encounter (Signed)
The absolute max dose of this medicine that is safe to take according to the manufacturer is 3600mg  in 24 hours.  I usually max my patients at 3000 in 24 hours, just to be cautious.  In last message, he was advised to take up to 5 pills per day of the 600mg  or 3000 in 24 hours.  I will allow him to take the maximum dose up to 3600 in 24 hours now if he chooses.  He may take TWO pills (600mg  each) or 1200mg  per dose up to 3 times a day.  1200 + 1200 + 1200 = 3600 in 24 hours.  Maximum is 6 pills in 24 hours.  I have changed it in the system. I did not send a new rx because it was just ordered.  He can request a 90 day supply or let know when he is ready for the new rx to be sent for 6 pills per day.  , DO [facility] 11/02/2019, 12:11 PM

## 2019-11-02 NOTE — Addendum Note (Signed)
Addended by: Smitty Cords on: 11/02/2019 12:13 PM   Modules accepted: Orders

## 2019-11-02 NOTE — Telephone Encounter (Signed)
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 Informed as per Dr.K that taking 5 pills is the max dose. His reply is based on his weight is the dosage can be increased ?

## 2019-11-02 NOTE — Telephone Encounter (Signed)
As per patient request left detail message.

## 2019-12-09 ENCOUNTER — Other Ambulatory Visit: Payer: Self-pay

## 2019-12-09 ENCOUNTER — Ambulatory Visit (INDEPENDENT_AMBULATORY_CARE_PROVIDER_SITE_OTHER): Payer: Medicaid Other | Admitting: Family Medicine

## 2019-12-09 ENCOUNTER — Encounter: Payer: Self-pay | Admitting: Family Medicine

## 2019-12-09 VITALS — BP 119/83 | HR 90 | Temp 97.5°F | Resp 16 | Ht 71.0 in | Wt 250.0 lb

## 2019-12-09 DIAGNOSIS — I10 Essential (primary) hypertension: Secondary | ICD-10-CM | POA: Diagnosis not present

## 2019-12-09 DIAGNOSIS — Z79899 Other long term (current) drug therapy: Secondary | ICD-10-CM

## 2019-12-09 DIAGNOSIS — M5442 Lumbago with sciatica, left side: Secondary | ICD-10-CM

## 2019-12-09 DIAGNOSIS — E1169 Type 2 diabetes mellitus with other specified complication: Secondary | ICD-10-CM | POA: Diagnosis not present

## 2019-12-09 DIAGNOSIS — E785 Hyperlipidemia, unspecified: Secondary | ICD-10-CM

## 2019-12-09 DIAGNOSIS — M4722 Other spondylosis with radiculopathy, cervical region: Secondary | ICD-10-CM

## 2019-12-09 DIAGNOSIS — M5441 Lumbago with sciatica, right side: Secondary | ICD-10-CM | POA: Diagnosis not present

## 2019-12-09 DIAGNOSIS — M4726 Other spondylosis with radiculopathy, lumbar region: Secondary | ICD-10-CM | POA: Diagnosis not present

## 2019-12-09 DIAGNOSIS — E538 Deficiency of other specified B group vitamins: Secondary | ICD-10-CM | POA: Diagnosis not present

## 2019-12-09 DIAGNOSIS — G8929 Other chronic pain: Secondary | ICD-10-CM | POA: Diagnosis not present

## 2019-12-09 MED ORDER — KETOROLAC TROMETHAMINE 60 MG/2ML IM SOLN: 60.0000 mg | Freq: Once | INTRAMUSCULAR | 1 refills | Status: DC | PRN

## 2019-12-09 MED ORDER — CYCLOBENZAPRINE HCL 10 MG PO TABS: 10.0000 mg | ORAL_TABLET | Freq: Three times a day (TID) | ORAL | 2 refills | Status: DC | PRN

## 2019-12-09 MED ORDER — GABAPENTIN 600 MG PO TABS: 1200.0000 mg | ORAL_TABLET | Freq: Three times a day (TID) | ORAL | 3 refills | Status: DC

## 2019-12-09 NOTE — Patient Instructions (Addendum)
Thank you for coming to the office today.  Referral sent today - stay tuned for apt.  [facility] [address] [place], [place] [address] Phone: [phone]  Alpha Lipoic Acid 600mg  OTC 2-3 a day to see if this helps nerve pain as well. May take the B12  If elevated blood sugar A1c next week - we can offer Trulicity injection once a week 0.75mg  we can do samples here and rx it for you as well  DUE for FASTING BLOOD WORK (no food or drink after midnight before the lab appointment, only water or coffee without cream/sugar on the morning of)  SCHEDULE "Lab Only" visit in the morning at the clinic for lab draw in 1 WEEK  - Make sure Lab Only appointment is at about 1 week before your next appointment, so that results will be available  For Lab Results, once available within 2-3 days of blood draw, you can can log in to MyChart online to view your results and a brief explanation. Also, we can discuss results at next follow-up visit.   Please schedule a Follow-up Appointment to: Return in about 4 days (around 12/13/2019) for Fasting lab only Tues 7/6 AM.  If you have any other questions or concerns, please feel free to call the office or send a message through MyChart. You may also schedule an earlier appointment if necessary.  Additionally, you may be receiving a survey about your experience at our office within a few days to 1 week by e-mail or mail. We value your feedback.  9/6, DO [facility], [facility]

## 2019-12-09 NOTE — Progress Notes (Signed)
Subjective:    Patient ID: Dean Clements, male    DOB: 27-Jul-1965, 54 y.o.   MRN: [id number]  Dean CreedDavid Christopher Talent is a 54 y.o. male presenting on 12/09/2019 for Numbness (both feet-hurts while walking-Right arm also gets numbness) and Back Pain (getting worst now-throbbing pain)   HPI   ACUTE ON CHRONIC LOW BACK PAIN / SCIATICA R>L / OSTEOARTHRITIS LUMBAR SPINE Chronic problem with known osteoarthritis of lumbar spine for years, but recent history is back to 2018 when referred to ortho, he had x-ray showed L4-5, L5-1 on prior imaging, he has been on medication management and also had PT course without improvement. PT had worsened his pain overall. Old injury in past 20-30 years ago heavy lifting caused acute injury in back thought he had herniated disc - [facility] and prefers other location - Today he returns to care to pursue referral to spine specialist for his chronic back pain. - He describes worsening back pain. He says can get flare up more often now. If works for 2 days, then will be out for 4 days approximately. May have period of time without pain for month in past but now that is less often, seems to have more pain on a daily basis. He now has more persistent symptoms. Sciatica bilateral, R>L with radicular pain into lower back gluteal region and into lower extremities Seems to affect his job in [employer]. Often he is working on knees and up and down ladder. Difficulty with position changes. Affecting his work at this time. He uses back braces regularly. Admits upper neck spine pain as well radiating into RUE Sleep is worse and more difficult, he has to lay on his back and it is affecting him. - Has used home injection Toradol 60mg  PRN - Taking Gabapentin 600mg  x 2 = 1200mg  TID In past has done prednisone taper without improvement Bilateral feet with neuropathy symptoms, has pain and pins and needles, worse at night and in morning  episodic. Seems gradual worsening of this problem over several months. Right arm upper extremity if he extends his arm out or changes position of shoulder, he will feel numbness in that arm temporarily. This is new problem. - has done other oral NSAIDs Lodine and other options without relief - Denies any fevers/chills, tingling, weakness, loss of control bladder/bowel incontinence or retention, unintentional wt loss, night sweats    Type 2 Diabetes Last A1c >2 years ago. Due for lab check, he says he will return next week Taking Metformin BID No other meds No recent sugar readings Admits neuropathy symptoms see below On Gabapentin  [facility] 2[facility] 01/17/2019 06/14/2018 11/17/2017  Decreased Interest 1 0 0  Down, Depressed, Hopeless 1 0 0  PHQ - 2 Score 2 0 0  Altered sleeping 0 - 3  Tired, decreased energy 0 - 0  Change in appetite 0 - 0  Feeling bad or failure about yourself  0 - 0  Trouble concentrating 0 - 0  Moving slowly or fidgety/restless 0 - 0  Suicidal thoughts 0 - 0  PHQ-9 Score 2 - 3  Difficult doing work/chores Not difficult at all - Somewhat difficult    Social History   Tobacco Use  . Smoking status: Current Some Day Smoker    Packs/day: 0.20    Years: 3.00    Pack years: 0.60    Types: Cigarettes  . Smokeless tobacco: Current User  Vaping Use  . Vaping Use:  Never used  Substance Use Topics  . Alcohol use: Yes    Alcohol/week: 28.0 standard drinks    Types: 28 Cans of beer per week  . Drug use: No    Review of Systems Per HPI unless specifically indicated above     Objective:    BP 119/83   Pulse 90   Temp (!) 97.5 F (36.4 C) (Temporal)   Resp 16   Ht 5\' 11"  (1.803 m)   Wt 250 lb (113.4 kg)   SpO2 97%   BMI 34.87 kg/m   Wt Readings from Last 3 Encounters:  12/09/19 250 lb (113.4 kg)  09/07/19 240 lb 9.6 oz (109.1 kg)  06/14/18 220 lb 9.6 oz (100.1 kg)    Physical Exam Vitals and nursing note reviewed.  Constitutional:       General: He is not in acute distress.    Appearance: He is well-developed. He is not diaphoretic.     Comments: Well-appearing, comfortable, cooperative  HENT:     Head: Normocephalic and atraumatic.  Eyes:     General:        Right eye: No discharge.        Left eye: No discharge.     Conjunctiva/sclera: Conjunctivae normal.  Neck:     Comments: Reduced range of motion with neck, limited rotation to Left. Some radiating pain into R upper extremity has range of motion of R shoulder rotator cuff intact Cardiovascular:     Rate and Rhythm: Normal rate.  Pulmonary:     Effort: Pulmonary effort is normal.  Musculoskeletal:     Comments: Low Back Inspection: Normal appearance, no spinal deformity, symmetrical. Palpation: No tenderness over spinous processes. Bilateral lumbar paraspinal muscles mild tender R>L w spasm ROM: Reduced range of motion forward flex/ext due to tightness and pain in back Special Testing: Seated SLR negative for radicular pain bilaterally Seated SLR with reproduced localized R back pain and positive radiculopathy Strength: Bilateral hip flex/ext 5/5, knee flex/ext 5/5, ankle dorsiflex/plantarflex 5/5 Neurovascular: intact distal sensation to light touch  Skin:    General: Skin is warm and dry.     Findings: No erythema or rash.  Neurological:     Mental Status: He is alert and oriented to person, place, and time.  Psychiatric:        Behavior: Behavior normal.     Comments: Well groomed, good eye contact, normal speech and thoughts      Diabetic Foot Exam - Simple   Simple Foot Form Diabetic Foot exam was performed with the following findings: Yes 12/09/2019  3:22 PM  Visual Inspection See comments: Yes Sensation Testing See comments: Yes Pulse Check Posterior Tibialis and Dorsalis pulse intact bilaterally: Yes Comments Bilateral callus formation heels and forefoot great toe and reduced monofilament sensation diffusely but mostly intact.      Results  for orders placed or performed in visit on 09/07/19  BASIC METABOLIC PANEL WITH GFR  Result Value Ref Range   Glucose, Bld 237 (H) 65 - 139 mg/dL   BUN 10 7 - 25 mg/dL   Creat 09/09/19 9.89 - 2.11 mg/dL   GFR, Est Non African American 100 > OR = 60 mL/min/1.52m2   GFR, Est African American 116 > OR = 60 mL/min/1.30m2   BUN/Creatinine Ratio NOT APPLICABLE 6 - 22 (calc)   Sodium 136 135 - 146 mmol/L   Potassium 3.8 3.5 - 5.3 mmol/L   Chloride 101 98 - 110 mmol/L   CO2 29  20 - 32 mmol/L   Calcium 10.0 8.6 - 10.3 mg/dL      Assessment & Plan:   Problem List Items Addressed This Visit    Type 2 diabetes mellitus with other specified complication (HCC)   Osteoarthritis of spine with radiculopathy, lumbar region   Relevant Medications   gabapentin (NEURONTIN) 600 MG tablet   cyclobenzaprine (FLEXERIL) 10 MG tablet   ketorolac (TORADOL) 60 MG/2ML SOLN injection   Essential hypertension   Relevant Medications   ketorolac (TORADOL) 60 MG/2ML SOLN injection   Chronic bilateral back pain - Primary   Relevant Medications   gabapentin (NEURONTIN) 600 MG tablet   cyclobenzaprine (FLEXERIL) 10 MG tablet   ketorolac (TORADOL) 60 MG/2ML SOLN injection    Other Visit Diagnoses    High risk medication use       Relevant Medications   ketorolac (TORADOL) 60 MG/2ML SOLN injection      #Type 2 Diabetes Lack of follow-up, not focus of visit today, he defers A1c check will return for lab apt next week Tues 7/6 will check A1c and urine microalbumin Contiue on Metformin for now Advised if elevated A1c, suboptimal control, next preferred med would be Trulicity 0.75mg  weekly inj GLP1 he can do sample here and then rx for medicaid when ready  #Neuropathy, lower extremity May be related to uncontrolled type 2 diabetes, or can be due to chronic spinal degenerative disease may have radicular symptoms causing complication. - Reduced monofilament today - Check A1c next week - Add Alpha Lipoic Acid, he is  already on B vitamin to help - Continue max dose Gabapentin, re order due to pharmacy not fill max amount last time.  #Osteoarthritis multiple sites, spinal OA/DJD lumbar / cervical  Chronic back pain Subacute on chronic R>L LBP with associated R>L sciatica. Suspect likely due to muscle spasm/strain in setting of chronic worsening osteoarthritis DJD lumbar spine, prior imaging 2018 with L4-5 and L5-S1 - No prior back surgery  Plan: 1. Re order Gabapentin 600mg  x 2 = 1200mg  TID, new rx sent 180 for 30 day supply - re order Flexeril 10mg  TID PRN 2. Refilled Toradol injection PRN, advised risks  3. Check BMET today f/u kidney function given NSAID use - next week lab order 4. May use Tylenol PRN for breakthrough  Referral to Neurosurgery/Spine specialty - [facility].  Meds ordered this encounter  Medications  . gabapentin (NEURONTIN) 600 MG tablet    Sig: Take 2 tablets (1,200 mg total) by mouth 3 (three) times daily. Max 6 pills per 24 hours    Dispense:  180 tablet    Refill:  3    Request to update pill count to 180  . cyclobenzaprine (FLEXERIL) 10 MG tablet    Sig: Take 1 tablet (10 mg total) by mouth 3 (three) times daily as needed for muscle spasms.    Dispense:  90 tablet    Refill:  2  . ketorolac (TORADOL) 60 MG/2ML SOLN injection    Sig: Inject 2 mLs (60 mg total) into the muscle once as needed for up to 1 dose for moderate pain or severe pain.    Dispense:  2 mL    Refill:  1     Orders Placed This Encounter  Procedures  . Hemoglobin A1c    Standing Status:   Future    Standing Expiration Date:   03/09/2020  . CBC with Differential/Platelet    Standing Status:   Future  Standing Expiration Date:   03/09/2020  . COMPLETE METABOLIC PANEL WITH GFR    Standing Status:   Future    Standing Expiration Date:   03/09/2020  . Lipid panel    Standing Status:   Future    Standing Expiration Date:   03/09/2020    Order Specific  Question:   Has the patient fasted?    Answer:   Yes  . Vitamin B12    Standing Status:   Future    Standing Expiration Date:   03/09/2020  . Microalbumin, urine    Standing Status:   Future    Standing Expiration Date:   03/09/2020  . Ambulatory referral to Neurosurgery    Referral Priority:   Routine    Referral Type:   Surgical    Referral Reason:   Specialty Services Required    Requested Specialty:   Neurosurgery    Number of Visits Requested:   1     Follow up plan: Return in about 4 days (around 12/13/2019) for Fasting lab only Tues 7/6 AM.   Future orders for 12/13/19 - CMET A1c Lipid CBC B12 = add urine microalbumin as well send out to [facility]  Saralyn Pilar, DO [facility] 12/09/2019, 3:05 PM

## 2019-12-13 ENCOUNTER — Other Ambulatory Visit: Payer: No Typology Code available for payment source

## 2019-12-21 ENCOUNTER — Ambulatory Visit: Payer: Self-pay

## 2019-12-21 NOTE — Telephone Encounter (Signed)
Patient called stating that he tried to give plasma and they turned him away because his pulse was too high. 109, 108. He is home and pulse 105- 95. He states he has no symptoms.  He denies chest pain difficult breathing. He was unaware that he had this problem until going to give plasma. He does drink at least 2 mountain dews per day.  He states that his rt hand and arm will tingle sometimes but that is from a back issue the he has been seen for and has referral appointment. Appointment scheduled per protocol.  Care advice read to patient. He verbalized understanding of all information.  Reason for Disposition . [1] Palpitations AND [2] no improvement after using CARE ADVICE  Answer Assessment - Initial Assessment Questions 1. DESCRIPTION: "Please describe your heart rate or heartbeat that you are having" (e.g., fast/slow, regular/irregular, skipped or extra beats, "palpitations")     fast 2. ONSET: "When did it start?" (Minutes, hours or days)     Today  Could not give plasma 3. DURATION: "How long does it last" (e.g., seconds, minutes, hours)     Continues  4. PATTERN "Does it come and go, or has it been constant since it started?"  "Does it get worse with exertion?"   "Are you feeling it now?"     unsure 5. TAP: "Using your hand, can you tap out what you are feeling on a chair or table in front of you, so that I can hear?" (Note: not all patients can do this)       regular 6. HEART RATE: "Can you tell me your heart rate?" "How many beats in 15 seconds?"  (Note: not all patients can do this)      108 - 95 7. RECURRENT SYMPTOM: "Have you ever had this before?" If Yes, ask: "When was the last time?" and "What happened that time?"      unknown 8. CAUSE: "What do you think is causing the palpitations?"    unsure 9. CARDIAC HISTORY: "Do you have any history of heart disease?" (e.g., heart attack, angina, bypass surgery, angioplasty, arrhythmia)      Arm/ hand goes numb has back injury Rt has  spoken to Dr Kirtland Bouchard about this 10. OTHER SYMPTOMS: "Do you have any other symptoms?" (e.g., dizziness, chest pain, sweating, difficulty breathing)       none 11. PREGNANCY: "Is there any chance you are pregnant?" "When was your last menstrual period?"      N/A  Protocols used: HEART RATE AND HEARTBEAT QUESTIONS-A-AH

## 2019-12-23 ENCOUNTER — Ambulatory Visit: Payer: No Typology Code available for payment source | Admitting: Family Medicine

## 2019-12-23 DIAGNOSIS — G629 Polyneuropathy, unspecified: Secondary | ICD-10-CM | POA: Insufficient documentation

## 2019-12-23 DIAGNOSIS — G5600 Carpal tunnel syndrome, unspecified upper limb: Secondary | ICD-10-CM | POA: Diagnosis not present

## 2019-12-23 DIAGNOSIS — M5412 Radiculopathy, cervical region: Secondary | ICD-10-CM | POA: Diagnosis not present

## 2019-12-23 DIAGNOSIS — M5440 Lumbago with sciatica, unspecified side: Secondary | ICD-10-CM | POA: Insufficient documentation

## 2020-01-05 ENCOUNTER — Ambulatory Visit: Payer: Self-pay | Admitting: *Deleted

## 2020-01-05 NOTE — Telephone Encounter (Signed)
Patient calling to report elevated BP readings- patient is scheduled for appointment tomorrow. Per triage disposition- ok to wait until tomorrow to be seen- BP perimeters given-patient voices understanding.  Reason for Disposition . [1] Systolic BP  >= 130 OR Diastolic >= 80 AND [2] not taking BP medications  Answer Assessment - Initial Assessment Questions 1. BLOOD PRESSURE: "What is the blood pressure?" "Did you take at least two measurements 5 minutes apart?"     144/94 P 122, 133/98 P 113 2. ONSET: "When did you take your blood pressure?"     2:30, 2:42 3. HOW: "How did you obtain the blood pressure?" (e.g., visiting nurse, automatic home BP monitor)     Automatic arm 4. HISTORY: "Do you have a history of high blood pressure?"     yes 5. MEDICATIONS: "Are you taking any medications for blood pressure?" "Have you missed any doses recently?"     No- was taken off 6. OTHER SYMPTOMS: "Do you have any symptoms?" (e.g., headache, chest pain, blurred vision, difficulty breathing, weakness)     no 7. PREGNANCY: "Is there any chance you are pregnant?" "When was your last menstrual period?"     n/a  Protocols used: BLOOD PRESSURE - HIGH-A-AH

## 2020-01-06 ENCOUNTER — Other Ambulatory Visit: Payer: Self-pay

## 2020-01-06 ENCOUNTER — Ambulatory Visit (INDEPENDENT_AMBULATORY_CARE_PROVIDER_SITE_OTHER): Payer: No Typology Code available for payment source | Admitting: Family Medicine

## 2020-01-06 ENCOUNTER — Encounter: Payer: Self-pay | Admitting: Family Medicine

## 2020-01-06 VITALS — BP 134/84 | HR 80 | Temp 97.7°F | Resp 16 | Ht 71.0 in | Wt 253.0 lb

## 2020-01-06 DIAGNOSIS — E1169 Type 2 diabetes mellitus with other specified complication: Secondary | ICD-10-CM | POA: Diagnosis not present

## 2020-01-06 DIAGNOSIS — I10 Essential (primary) hypertension: Secondary | ICD-10-CM | POA: Diagnosis not present

## 2020-01-06 DIAGNOSIS — E538 Deficiency of other specified B group vitamins: Secondary | ICD-10-CM

## 2020-01-06 DIAGNOSIS — E785 Hyperlipidemia, unspecified: Secondary | ICD-10-CM

## 2020-01-06 DIAGNOSIS — Z79899 Other long term (current) drug therapy: Secondary | ICD-10-CM

## 2020-01-06 MED ORDER — LOSARTAN POTASSIUM 25 MG PO TABS: 25.0000 mg | ORAL_TABLET | Freq: Every day | ORAL | 2 refills | Status: DC

## 2020-01-06 NOTE — Progress Notes (Signed)
Subjective:    Patient ID: Dean Clements, male    DOB: 03/15/1966, 54 y.o.   MRN: [id number]  Dean Clements is a 54 y.o. male presenting on 01/06/2020 for Hypertension (as per pt gets 170/80--145/90 mm/hg)   HPI    HTN Recently reports seems to fluctuate with the BP readings. See above Donating plasma, was declined due to BP and HR Has home BP cuff Lisinopril 10mg  daily in past, 2016 - 2019 then taken off due to AKI with heat stroke and dehydration He has new job coming up, will be more indoors now, less risk of heat and dehydration work at [employer] Denies CP, dyspnea, HA, edema, dizziness / lightheadedness   Depression screen [facility] 2/9 01/17/2019 06/14/2018 11/17/2017  Decreased Interest 1 0 0  Down, Depressed, Hopeless 1 0 0  PHQ - 2 Score 2 0 0  Altered sleeping 0 - 3  Tired, decreased energy 0 - 0  Change in appetite 0 - 0  Feeling bad or failure about yourself  0 - 0  Trouble concentrating 0 - 0  Moving slowly or fidgety/restless 0 - 0  Suicidal thoughts 0 - 0  PHQ-9 Score 2 - 3  Difficult doing work/chores Not difficult at all - Somewhat difficult    Social History   Tobacco Use  . Smoking status: Current Some Day Smoker    Packs/day: 0.20    Years: 3.00    Pack years: 0.60    Types: Cigarettes  . Smokeless tobacco: Current User  Vaping Use  . Vaping Use: Never used  Substance Use Topics  . Alcohol use: Yes    Alcohol/week: 28.0 standard drinks    Types: 28 Cans of beer per week  . Drug use: No    Review of Systems Per HPI unless specifically indicated above     Objective:    BP (!) 134/84 (BP Location: Left Arm, Cuff Size: Normal)   Pulse 80   Temp 97.7 F (36.5 C) (Temporal)   Resp 16   Ht 5\' 11"  (1.803 m)   Wt (!) 253 lb (114.8 kg)   SpO2 96%   BMI 35.29 kg/m   Wt Readings from Last 3 Encounters:  01/06/20 (!) 253 lb (114.8 kg)  12/09/19 250 lb (113.4 kg)  09/07/19 240 lb 9.6 oz (109.1 kg)     Physical Exam Vitals and nursing note reviewed.  Constitutional:      General: He is not in acute distress.    Appearance: He is well-developed. He is not diaphoretic.     Comments: Well-appearing, comfortable, cooperative  HENT:     Head: Normocephalic and atraumatic.  Eyes:     General:        Right eye: No discharge.        Left eye: No discharge.     Conjunctiva/sclera: Conjunctivae normal.  Cardiovascular:     Rate and Rhythm: Normal rate.  Pulmonary:     Effort: Pulmonary effort is normal.  Skin:    General: Skin is warm and dry.     Findings: No erythema or rash.  Neurological:     Mental Status: He is alert and oriented to person, place, and time.  Psychiatric:        Behavior: Behavior normal.     Comments: Well groomed, good eye contact, normal speech and thoughts        Results for orders placed or performed in visit on 09/07/19  BASIC METABOLIC  PANEL WITH GFR  Result Value Ref Range   Glucose, Bld 237 (H) 65 - 139 mg/dL   BUN 10 7 - 25 mg/dL   Creat 1.61 0.96 - 0.45 mg/dL   GFR, Est Non African American 100 > OR = 60 mL/min/1.46m2   GFR, Est African American 116 > OR = 60 mL/min/1.67m2   BUN/Creatinine Ratio NOT APPLICABLE 6 - 22 (calc)   Sodium 136 135 - 146 mmol/L   Potassium 3.8 3.5 - 5.3 mmol/L   Chloride 101 98 - 110 mmol/L   CO2 29 20 - 32 mmol/L   Calcium 10.0 8.6 - 10.3 mg/dL      Assessment & Plan:   Problem List Items Addressed This Visit    Type 2 diabetes mellitus with other specified complication (HCC)   Relevant Medications   losartan (COZAAR) 25 MG tablet   Hyperlipidemia associated with type 2 diabetes mellitus (HCC)   Relevant Medications   losartan (COZAAR) 25 MG tablet   Essential hypertension - Primary    Mildly elevated initial BP, repeat manual check improved but still concerns if at goal all the time, has labile history of BP readings. - Home BP readings limited  No known complications    Plan:  1. RESTART anti HTN med  - switch from ACEi to ARB - was on Lisinopril 10 now start LOSARTAN 25mg  daily 2. Encourage improved lifestyle - low sodium diet, regular exercise 3. Continue monitor BP outside office, bring readings to next visit, if persistently >140/90 or new symptoms notify office sooner 4. Follow-up next week for labs, then due in 3 months for BMET for Cr / K  He had history of AKI in past, but due to dehydration and hospitalization, unlikely ACEi induced. He is diabetic, will be on ARB as well for renal protection       Relevant Medications   losartan (COZAAR) 25 MG tablet    Other Visit Diagnoses    B12 deficiency       High risk medication use          Meds ordered this encounter  Medications  . losartan (COZAAR) 25 MG tablet    Sig: Take 1 tablet (25 mg total) by mouth daily.    Dispense:  30 tablet    Refill:  2      Follow up plan: Return in about 3 months (around 04/07/2020) for 3 month for non fasting lab and virtual HTN.  Future labs ordered previously, will release now active for next week  WIll place future order for BMET for 3 months for ARB follow-up Cr / K   04/09/2020, DO [facility] 01/06/2020, 4:56 PM

## 2020-01-06 NOTE — Assessment & Plan Note (Signed)
Mildly elevated initial BP, repeat manual check improved but still concerns if at goal all the time, has labile history of BP readings. - Home BP readings limited  No known complications    Plan:  1. RESTART anti HTN med - switch from ACEi to ARB - was on Lisinopril 10 now start LOSARTAN 25mg  daily 2. Encourage improved lifestyle - low sodium diet, regular exercise 3. Continue monitor BP outside office, bring readings to next visit, if persistently >140/90 or new symptoms notify office sooner 4. Follow-up next week for labs, then due in 3 months for BMET for Cr / K  He had history of AKI in past, but due to dehydration and hospitalization, unlikely ACEi induced. He is diabetic, will be on ARB as well for renal protection

## 2020-01-06 NOTE — Patient Instructions (Addendum)
Thank you for coming to the office today.  Start Losartan 25mg  daily Caution angioedema, facial lip throat swelling reaction, can stop med or contact or go to emergency room. Seek care if difficulty breathing, this is very rare reaction, can occur months or weeks or years on med.  Similar to Lisinopril 10mg . This should be a good transition to help control BP   DUE NON FASTING BLOOD WORK stay hydrated though!  SCHEDULE "Lab Only" visit in the morning at the clinic for lab draw in 6 WEEKS to 3 MONTHS   - Make sure Lab Only appointment is at about 1 week before your next appointment, so that results will be available  For Lab Results, once available within 2-3 days of blood draw, you can can log in to MyChart online to view your results and a brief explanation. Also, we can discuss results at next follow-up visit.   Please schedule a Follow-up Appointment to: Return in about 3 months (around 04/07/2020) for 3 month for non fasting lab and virtual HTN.  If you have any other questions or concerns, please feel free to call the office or send a message through MyChart. You may also schedule an earlier appointment if necessary.  Additionally, you may be receiving a survey about your experience at our office within a few days to 1 week by e-mail or mail. We value your feedback.  , DO [facility], Saralyn Pilar

## 2020-01-10 DIAGNOSIS — M5440 Lumbago with sciatica, unspecified side: Secondary | ICD-10-CM | POA: Diagnosis not present

## 2020-01-10 DIAGNOSIS — M542 Cervicalgia: Secondary | ICD-10-CM | POA: Diagnosis not present

## 2020-01-10 DIAGNOSIS — M5412 Radiculopathy, cervical region: Secondary | ICD-10-CM | POA: Diagnosis not present

## 2020-01-10 DIAGNOSIS — M545 Low back pain: Secondary | ICD-10-CM | POA: Diagnosis not present

## 2020-01-30 ENCOUNTER — Other Ambulatory Visit: Payer: Self-pay | Admitting: Family Medicine

## 2020-01-30 DIAGNOSIS — G8929 Other chronic pain: Secondary | ICD-10-CM

## 2020-01-30 DIAGNOSIS — I10 Essential (primary) hypertension: Secondary | ICD-10-CM

## 2020-01-30 DIAGNOSIS — M4726 Other spondylosis with radiculopathy, lumbar region: Secondary | ICD-10-CM

## 2020-01-30 DIAGNOSIS — M5442 Lumbago with sciatica, left side: Secondary | ICD-10-CM

## 2020-01-30 DIAGNOSIS — Z79899 Other long term (current) drug therapy: Secondary | ICD-10-CM

## 2020-01-30 MED ORDER — KETOROLAC TROMETHAMINE 60 MG/2ML IM SOLN: 60.0000 mg | Freq: Once | INTRAMUSCULAR | 1 refills | Status: DC | PRN

## 2020-01-30 NOTE — Telephone Encounter (Signed)
I spoke with pharmacy and they stated they didn't receive Rx for ketorolac (TORADOL) 60 MG/2ML SOLN injection  Pt called for refill/ Please advise pharmacy of refills or resend to   [facility] - [place], [place] - [address] Phone:  [phone]  Fax:  [phone]

## 2020-01-30 NOTE — Telephone Encounter (Signed)
Requested medication (s) are due for refill today: yes  Requested medication (s) are on the active medication list: yes  Last refill:  12/09/2019  Future visit scheduled: no  Notes to clinic:  Medication not assigned to a protocol, review manually.   Requested Prescriptions  Pending Prescriptions Disp Refills   ketorolac (TORADOL) 60 MG/2ML SOLN injection 2 mL 1    Sig: Inject 2 mLs (60 mg total) into the muscle once as needed for up to 1 dose for moderate pain or severe pain.      Off-Protocol Failed - 01/30/2020  1:47 PM      Failed - Medication not assigned to a protocol, review manually.      Passed - Valid encounter within last 12 months    Recent Outpatient Visits           3 weeks ago Essential hypertension   [facility] [place], Netta Neat, DO   1 month ago Chronic bilateral low back pain with bilateral sciatica   [facility] [place], Netta Neat, DO   4 months ago Chronic bilateral low back pain with bilateral sciatica   [facility] Smitty Cords, DO   1 year ago Sore throat   [facility] [place], Netta Neat, DO   1 year ago Acute gout due to other secondary cause involving toe of left foot   [facility], Netta Neat, [place]

## 2020-01-30 NOTE — Telephone Encounter (Signed)
Pt has called bk states wants Dr Kirtland Bouchard to know in a lot of pain and is supposed to get the results of MRI's on Thursday. If he could possible refill this, states drugstore closes at 7:00 PM

## 2020-01-31 ENCOUNTER — Telehealth: Payer: Self-pay | Admitting: Family Medicine

## 2020-01-31 ENCOUNTER — Telehealth: Payer: Self-pay

## 2020-01-31 NOTE — Telephone Encounter (Signed)
Agreed to write work note for patient out today tues 8/24 back tomorrow 8/25 due to lack of pain medicine, he was unable to pick up medicine from pharmacy, I spoke with him last night after ordering it.  Saralyn Pilar, DO [facility] 01/31/2020, 12:23 PM

## 2020-01-31 NOTE — Telephone Encounter (Signed)
Patient walked into office just now.   Agreed to write work note for patient out today tues 8/24 back tomorrow 8/25 due to lack of pain medicine, he was unable to pick up medicine from pharmacy, I spoke with him last night after ordering it.  Printed, signed note given to patient  Saralyn Pilar, DO [facility] 01/31/2020, 12:23 PM

## 2020-01-31 NOTE — Telephone Encounter (Signed)
Unable to reach the patient left message to get more information about work note and Rx send yesterday so which error happened from the pharmacy side ?

## 2020-01-31 NOTE — Telephone Encounter (Signed)
Copied from CRM [id number]. Topic: General - Other >> Jan 31, 2020  8:57 AM [name] A wrote: Patient would like to know if Dr. Kirtland Bouchard can write him a note to excuse him from work due to not getting his medication yesterday. Patient was unable to get his medication last night to due to pharmacist error. Please advise

## 2020-02-02 ENCOUNTER — Other Ambulatory Visit: Payer: Self-pay | Admitting: Neurosurgery

## 2020-02-02 DIAGNOSIS — M5126 Other intervertebral disc displacement, lumbar region: Secondary | ICD-10-CM | POA: Diagnosis not present

## 2020-02-02 DIAGNOSIS — G950 Syringomyelia and syringobulbia: Secondary | ICD-10-CM | POA: Diagnosis not present

## 2020-02-09 ENCOUNTER — Other Ambulatory Visit: Payer: No Typology Code available for payment source

## 2020-02-17 ENCOUNTER — Ambulatory Visit
Admission: RE | Admit: 2020-02-17 | Discharge: 2020-02-17 | Disposition: A | Discharge status: Home / Self Care | Payer: Medicaid Other | Source: Ambulatory Visit | Attending: Neurosurgery | Admitting: Neurosurgery

## 2020-02-17 ENCOUNTER — Other Ambulatory Visit: Payer: Self-pay

## 2020-02-17 DIAGNOSIS — M5126 Other intervertebral disc displacement, lumbar region: Secondary | ICD-10-CM | POA: Diagnosis not present

## 2020-02-17 MED ORDER — IOPAMIDOL (ISOVUE-M 200) INJECTION 41%
1.0000 mL | Freq: Once | INTRAMUSCULAR | Status: AC
  Administered 2020-02-17: 1 mL via EPIDURAL

## 2020-02-17 MED ORDER — METHYLPREDNISOLONE ACETATE 40 MG/ML INJ SUSP (RADIOLOG
120.0000 mg | Freq: Once | INTRAMUSCULAR | Status: AC
  Administered 2020-02-17: 120 mg via EPIDURAL

## 2020-02-17 NOTE — Discharge Instructions (Signed)

## 2020-02-29 ENCOUNTER — Other Ambulatory Visit: Payer: Self-pay | Admitting: Family Medicine

## 2020-02-29 DIAGNOSIS — E1169 Type 2 diabetes mellitus with other specified complication: Secondary | ICD-10-CM

## 2020-02-29 DIAGNOSIS — K219 Gastro-esophageal reflux disease without esophagitis: Secondary | ICD-10-CM

## 2020-03-12 DIAGNOSIS — G629 Polyneuropathy, unspecified: Secondary | ICD-10-CM | POA: Diagnosis not present

## 2020-03-12 DIAGNOSIS — G5603 Carpal tunnel syndrome, bilateral upper limbs: Secondary | ICD-10-CM | POA: Diagnosis not present

## 2020-03-12 DIAGNOSIS — M5126 Other intervertebral disc displacement, lumbar region: Secondary | ICD-10-CM | POA: Diagnosis not present

## 2020-03-12 DIAGNOSIS — M5416 Radiculopathy, lumbar region: Secondary | ICD-10-CM | POA: Diagnosis not present

## 2020-03-14 ENCOUNTER — Other Ambulatory Visit: Payer: Self-pay | Admitting: Neurosurgery

## 2020-03-14 ENCOUNTER — Telehealth (INDEPENDENT_AMBULATORY_CARE_PROVIDER_SITE_OTHER): Payer: Medicaid Other | Admitting: Family Medicine

## 2020-03-14 DIAGNOSIS — E1169 Type 2 diabetes mellitus with other specified complication: Secondary | ICD-10-CM | POA: Diagnosis not present

## 2020-03-14 DIAGNOSIS — M5416 Radiculopathy, lumbar region: Secondary | ICD-10-CM

## 2020-03-14 LAB — POCT UA - MICROALBUMIN: Microalbumin Ur, POC: 0 mg/L

## 2020-03-14 NOTE — Telephone Encounter (Signed)
Patient had lab done today and could not leave urine sample in the morning and came back during lunch time -- was ordered for micro albumin done POCT result entered negative.

## 2020-03-15 LAB — COMPLETE METABOLIC PANEL WITH GFR
AG Ratio: 1.6 (calc) (ref 1.0–2.5)
ALT: 47 U/L — ABNORMAL HIGH (ref 9–46)
AST: 30 U/L (ref 10–35)
Albumin: 4.4 g/dL (ref 3.6–5.1)
Alkaline phosphatase (APISO): 65 U/L (ref 35–144)
BUN: 12 mg/dL (ref 7–25)
CO2: 29 mmol/L (ref 20–32)
Calcium: 10 mg/dL (ref 8.6–10.3)
Chloride: 101 mmol/L (ref 98–110)
Creat: 0.83 mg/dL (ref 0.70–1.33)
GFR, Est African American: 116 mL/min/{1.73_m2} (ref >=60)
GFR, Est Non African American: 100 mL/min/{1.73_m2} (ref >=60)
Globulin: 2.8 g/dL (calc) (ref 1.9–3.7)
Glucose, Bld: 179 mg/dL — ABNORMAL HIGH (ref 65–99)
Potassium: 4.3 mmol/L (ref 3.5–5.3)
Sodium: 140 mmol/L (ref 135–146)
Total Bilirubin: 1.2 mg/dL (ref 0.2–1.2)
Total Protein: 7.2 g/dL (ref 6.1–8.1)

## 2020-03-15 LAB — CBC WITH DIFFERENTIAL/PLATELET
Absolute Monocytes: 650 cells/uL (ref 200–950)
Basophils Absolute: 59 cells/uL (ref 0–200)
Basophils Relative: 0.9 %
Eosinophils Absolute: 202 cells/uL (ref 15–500)
Eosinophils Relative: 3.1 %
HCT: 45.4 % (ref 38.5–50.0)
Hemoglobin: 15.4 g/dL (ref 13.2–17.1)
Lymphs Abs: 1918 cells/uL (ref 850–3900)
MCH: 31.2 pg (ref 27.0–33.0)
MCHC: 33.9 g/dL (ref 32.0–36.0)
MCV: 91.9 fL (ref 80.0–100.0)
MPV: 11.9 fL (ref 7.5–12.5)
Monocytes Relative: 10 %
Neutro Abs: 3673 cells/uL (ref 1500–7800)
Neutrophils Relative %: 56.5 %
Platelets: 198 10*3/uL (ref 140–400)
RBC: 4.94 10*6/uL (ref 4.20–5.80)
RDW: 12.1 % (ref 11.0–15.0)
Total Lymphocyte: 29.5 %
WBC: 6.5 10*3/uL (ref 3.8–10.8)

## 2020-03-15 LAB — HEMOGLOBIN A1C
Hgb A1c MFr Bld: 7.9 % of total Hgb — ABNORMAL HIGH (ref <5.7)
Mean Plasma Glucose: 180 (calc)
eAG (mmol/L): 10 (calc)

## 2020-03-15 LAB — LIPID PANEL
Cholesterol: 262 mg/dL — ABNORMAL HIGH (ref <200)
HDL: 59 mg/dL (ref >=40)
LDL Cholesterol (Calc): 179 mg/dL (calc) — ABNORMAL HIGH
Non-HDL Cholesterol (Calc): 203 mg/dL (calc) — ABNORMAL HIGH (ref <130)
Total CHOL/HDL Ratio: 4.4 (calc) (ref <5.0)
Triglycerides: 111 mg/dL (ref <150)

## 2020-03-15 LAB — VITAMIN B12: Vitamin B-12: 514 pg/mL (ref 200–1100)

## 2020-03-21 ENCOUNTER — Other Ambulatory Visit: Payer: Self-pay | Admitting: Neurosurgery

## 2020-03-21 ENCOUNTER — Other Ambulatory Visit: Payer: Self-pay

## 2020-03-21 ENCOUNTER — Ambulatory Visit
Admission: RE | Admit: 2020-03-21 | Discharge: 2020-03-21 | Disposition: A | Discharge status: Home / Self Care | Payer: Medicaid Other | Source: Ambulatory Visit | Attending: Neurosurgery | Admitting: Neurosurgery

## 2020-03-21 DIAGNOSIS — M5416 Radiculopathy, lumbar region: Secondary | ICD-10-CM

## 2020-03-21 DIAGNOSIS — M47816 Spondylosis without myelopathy or radiculopathy, lumbar region: Secondary | ICD-10-CM | POA: Diagnosis not present

## 2020-03-21 MED ORDER — METHYLPREDNISOLONE ACETATE 40 MG/ML INJ SUSP (RADIOLOG
120.0000 mg | Freq: Once | INTRAMUSCULAR | Status: AC
  Administered 2020-03-21: 120 mg via EPIDURAL

## 2020-03-21 MED ORDER — IOPAMIDOL (ISOVUE-M 200) INJECTION 41%
1.0000 mL | Freq: Once | INTRAMUSCULAR | Status: AC
  Administered 2020-03-21: 1 mL via EPIDURAL

## 2020-03-26 ENCOUNTER — Other Ambulatory Visit: Payer: Self-pay

## 2020-03-26 ENCOUNTER — Ambulatory Visit (INDEPENDENT_AMBULATORY_CARE_PROVIDER_SITE_OTHER): Payer: Medicaid Other | Admitting: Family Medicine

## 2020-03-26 ENCOUNTER — Encounter: Payer: Self-pay | Admitting: Family Medicine

## 2020-03-26 VITALS — BP 129/72 | HR 103 | Temp 97.7°F | Resp 16 | Ht 71.0 in | Wt 245.0 lb

## 2020-03-26 DIAGNOSIS — E785 Hyperlipidemia, unspecified: Secondary | ICD-10-CM | POA: Diagnosis not present

## 2020-03-26 DIAGNOSIS — I10 Essential (primary) hypertension: Secondary | ICD-10-CM

## 2020-03-26 DIAGNOSIS — E1169 Type 2 diabetes mellitus with other specified complication: Secondary | ICD-10-CM | POA: Diagnosis not present

## 2020-03-26 DIAGNOSIS — E1140 Type 2 diabetes mellitus with diabetic neuropathy, unspecified: Secondary | ICD-10-CM

## 2020-03-26 DIAGNOSIS — Z1211 Encounter for screening for malignant neoplasm of colon: Secondary | ICD-10-CM

## 2020-03-26 MED ORDER — LOSARTAN POTASSIUM 25 MG PO TABS: 25.0000 mg | ORAL_TABLET | Freq: Every day | ORAL | 3 refills | Status: DC

## 2020-03-26 MED ORDER — METFORMIN HCL ER 750 MG PO TB24: 1500.0000 mg | ORAL_TABLET | Freq: Every day | ORAL | 1 refills | Status: DC

## 2020-03-26 MED ORDER — ROSUVASTATIN CALCIUM 10 MG PO TABS: 10.0000 mg | ORAL_TABLET | Freq: Every day | ORAL | 1 refills | Status: DC

## 2020-03-26 NOTE — Assessment & Plan Note (Signed)
Improved control on ARB - Home BP readings limited  No known complications   Chemistry lab is normal on ARB   Plan:  1. Continue current dose Losartan 25mg  daily - refilled 2. Encourage improved lifestyle - low sodium diet, regular exercise 3. Continue monitor BP outside office, bring readings to next visit, if persistently >140/90 or new symptoms notify office sooner

## 2020-03-26 NOTE — Progress Notes (Signed)
Subjective:    Patient ID: Dean Clements, male    DOB: 1965/12/14, 54 y.o.   MRN: [id number]  Dean Clements is a 54 y.o. male presenting on 03/26/2020 for Diabetes (follow up after lab work)   HPI   CHRONIC HTN: Reports last visit 12/2019 had higher BP, switched off Lisinopril to Losartan Now improved readings Current Meds - Losartan 25mg  daily   Reports good compliance, took meds today. Tolerating well, w/o complaints. Denies CP, dyspnea, HA, edema, dizziness / lightheadedness  CHRONIC DM, Type 2: Hyperlipidemia  Reports concerns with neuropathy vs RLS CBGs: none - he is interested in glucometer Now worsening Diabetic neuropathy, with pins and needles He takes Gabapentin 600mg  x 2 = 1200mg  TID Higher A1c 7.9 now He takes Metformin IR 500mg  2-3 in AM only due to convenience and difficulty adhering to pills.  Elevated LDL >170 Not on statin therapy  Currently on ARB Diet admits eats a lot of fruit, less sweets Denies hypoglycemia, polyuria, visual changes, numbness or tingling.   Health Maintenance: Due for Flu Shot, declines today despite counseling on benefits  Declines COVID vaccine  Depression screen [facility] 2/9 03/26/2020 01/17/2019 06/14/2018  Decreased Interest 0 1 0  Down, Depressed, Hopeless 0 1 0  PHQ - 2 Score 0 2 0  Altered sleeping - 0 -  Tired, decreased energy - 0 -  Change in appetite - 0 -  Feeling bad or failure about yourself  - 0 -  Trouble concentrating - 0 -  Moving slowly or fidgety/restless - 0 -  Suicidal thoughts - 0 -  PHQ-9 Score - 2 -  Difficult doing work/chores - Not difficult at all -    Social History   Tobacco Use  . Smoking status: Current Some Day Smoker    Packs/day: 0.20    Years: 3.00    Pack years: 0.60    Types: Cigarettes  . Smokeless tobacco: Current User  Vaping Use  . Vaping Use: Never used  Substance Use Topics  . Alcohol use: Yes    Alcohol/week: 28.0 standard drinks    Types: 28 Cans of  beer per week  . Drug use: No    Review of Systems Per HPI unless specifically indicated above     Objective:    BP 129/72   Pulse (!) 103   Temp 97.7 F (36.5 C) (Temporal)   Resp 16   Ht 5\' 11"  (1.803 m)   Wt 245 lb (111.1 kg)   SpO2 98%   BMI 34.17 kg/m   Wt Readings from Last 3 Encounters:  03/26/20 245 lb (111.1 kg)  01/06/20 (!) 253 lb (114.8 kg)  12/09/19 250 lb (113.4 kg)    Physical Exam Vitals and nursing note reviewed.  Constitutional:      General: He is not in acute distress.    Appearance: He is well-developed. He is not diaphoretic.     Comments: Well-appearing, comfortable, cooperative  HENT:     Head: Normocephalic and atraumatic.  Eyes:     General:        Right eye: No discharge.        Left eye: No discharge.     Conjunctiva/sclera: Conjunctivae normal.  Neck:     Thyroid: No thyromegaly.  Cardiovascular:     Rate and Rhythm: Normal rate and regular rhythm.     Heart sounds: Normal heart sounds. No murmur heard.   Pulmonary:     Effort: Pulmonary effort is  normal. No respiratory distress.     Breath sounds: Normal breath sounds. No wheezing or rales.  Musculoskeletal:        General: Normal range of motion.     Cervical back: Normal range of motion and neck supple.  Lymphadenopathy:     Cervical: No cervical adenopathy.  Skin:    General: Skin is warm and dry.     Findings: No erythema or rash.  Neurological:     Mental Status: He is alert and oriented to person, place, and time.  Psychiatric:        Behavior: Behavior normal.     Comments: Well groomed, good eye contact, normal speech and thoughts       Recent Labs    03/14/20 1026  HGBA1C 7.9*     Results for orders placed or performed in visit on 03/14/20  POCT UA - Microalbumin  Result Value Ref Range   Microalbumin Ur, POC 0 mg/L      Assessment & Plan:   Problem List Items Addressed This Visit    Type 2 diabetes mellitus with diabetic neuropathy (HCC) - Primary      Elevated A1c at 7.9 No known hypoglycemia Complications - Hyperglycemia, peripheral neuropathy, hyperlipidemia - increases risk of future cardiovascular complications and also GERD  Plan:  1. SWITCH Metformin from IR to XR since he is dosing once daily only - take 750mg  x 2 = 1500mg  daily, new rx - Future reconsider other new med options such as GLP1 therapy 2. Encourage improved lifestyle - low carb, low sugar diet, reduce portion size, continue improving regular exercise 3. Check CBG , bring log to next visit for review - can look into covered glucometer from Medicaid 4. Continue ACEi,  5. Advised to schedule DM ophtho exam, send record  For neuropathy - already on max dose Gabapentin. Will treat sugars first, and in future consider other therapy for neuropathy, consider Podiatry vs Neurology  F/u 4 month A1c      Relevant Medications   losartan (COZAAR) 25 MG tablet   metFORMIN (GLUCOPHAGE XR) 750 MG 24 hr tablet   rosuvastatin (CRESTOR) 10 MG tablet   Other Relevant Orders   Hemoglobin A1c   Hyperlipidemia associated with type 2 diabetes mellitus (HCC)    Uncontrolled cholesterol, not on statin Last lipid panel 03/2020 LDL 179 The 10-year ASCVD risk score DC Jr., et al., 2013) is: 24.6%  Plan: 1. Discussed ASCVD risk / treat LDL - NEW START Statin therapy - Rosuvastatin 10mg  nightly - counseling on side effect benefits dosing 2. Encourage improved lifestyle - low carb/cholesterol, reduce portion size, continue improving regular exercise  F/u 4 month for lipid and chemistry      Relevant Medications   losartan (COZAAR) 25 MG tablet   metFORMIN (GLUCOPHAGE XR) 750 MG 24 hr tablet   rosuvastatin (CRESTOR) 10 MG tablet   Other Relevant Orders   COMPLETE METABOLIC PANEL WITH GFR   Lipid panel   Essential hypertension    Improved control on ARB - Home BP readings limited  No known complications   Chemistry lab is normal on ARB   Plan:  1. Continue current  dose Losartan 25mg  daily - refilled 2. Encourage improved lifestyle - low sodium diet, regular exercise 3. Continue monitor BP outside office, bring readings to next visit, if persistently >140/90 or new symptoms notify office sooner      Relevant Medications   losartan (COZAAR) 25 MG tablet   rosuvastatin (CRESTOR)  10 MG tablet    Other Visit Diagnoses    Screening for colon cancer       Relevant Orders   Cologuard      Meds ordered this encounter  Medications  . losartan (COZAAR) 25 MG tablet    Sig: Take 1 tablet (25 mg total) by mouth daily.    Dispense:  90 tablet    Refill:  3  . metFORMIN (GLUCOPHAGE XR) 750 MG 24 hr tablet    Sig: Take 2 tablets (1,500 mg total) by mouth daily with breakfast.    Dispense:  180 tablet    Refill:  1  . rosuvastatin (CRESTOR) 10 MG tablet    Sig: Take 1 tablet (10 mg total) by mouth at bedtime.    Dispense:  90 tablet    Refill:  1      Follow up plan: Return in about 4 months (around 07/27/2020) for 4 month fasting lab then 1 week later DM, Cholesterol med adjust.  Future labs ordered for 07/2020 - CMET A1c Lipid  Saralyn Pilar, DO [facility] 03/26/2020, 2:24 PM

## 2020-03-26 NOTE — Assessment & Plan Note (Addendum)
Elevated A1c at 7.9 No known hypoglycemia Complications - Hyperglycemia, peripheral neuropathy, hyperlipidemia - increases risk of future cardiovascular complications and also GERD  Plan:  1. SWITCH Metformin from IR to XR since he is dosing once daily only - take 750mg  x 2 = 1500mg  daily, new rx - Future reconsider other new med options such as GLP1 therapy 2. Encourage improved lifestyle - low carb, low sugar diet, reduce portion size, continue improving regular exercise 3. Check CBG , bring log to next visit for review - can look into covered glucometer from Medicaid 4. Continue ACEi,  5. Advised to schedule DM ophtho exam, send record  For neuropathy - already on max dose Gabapentin. Will treat sugars first, and in future consider other therapy for neuropathy, consider Podiatry vs Neurology  F/u 4 month A1c

## 2020-03-26 NOTE — Patient Instructions (Addendum)
Thank you for coming to the office today.  Losartan is working well, re ordered.  -------------------------  CHANGE Metformin from IR (immediate) to XR extended release.  750mg  take two = 1500mg  in MORNING will last 24 hours.  --------------------------------------------------------  Ordered Cologuard   Follow instructions to collect sample, you may call the company for any help or questions, 24/7 telephone support at [phone].  -------------------------------------------------------  New cholesterol medicine  You are at increased risk of future Cardiovascular complications such as Heart Attack or Stroke from an artery blockage due to abnormal cholesterol and/or risk factors. - As discussed, Statin Cholesterol pills both can both LOWER cholesterol and REDUCE this future risk of heart attack and stroke - Start Rosuvastatin (generic Crestor) 10mg  pill once at bedtime every night  If you develop mild aches or pains in muscle or joint that does NOT improve or go away after first 3-4 weeks then this may require to adjust the dose. First I would recommend STOPPING the medication for a few weeks until your ache and pain symptoms completely RESOLVE. Then you can restart at a LOWER DOSE either HALF a pill at bedtime every night or LESS OFTEN such as one pill a week only and then gradually increase to every other day or max dose of 3 times a week  Lastly, sometimes we need to try other versions of this medicine to find one that works for you and does not cause side effects.    1. Chemistry - Mostly normal, slightly elevated borderline ALT liver enzyme - Elevated blood sugar  2. Hemoglobin A1c (Diabetes) - 7.9, elevated from prior 5.8-6  3. Vitamin B12 -514 normal  4. Cholesterol - Elevated LDL 179, otherwise normal HDL and TG The 10-year ASCVD risk score [phone] DC Jr., et al., 2013) is: 27%  5. CBC Blood Counts - Normal, no anemia, other abnormality  DUE for FASTING BLOOD  WORK (no food or drink after midnight before the lab appointment, only water or coffee without cream/sugar on the morning of)  SCHEDULE "Lab Only" visit in the morning at the clinic for lab draw in 4 MONTHS   - Make sure Lab Only appointment is at about 1 week before your next appointment, so that results will be available  For Lab Results, once available within 2-3 days of blood draw, you can can log in to MyChart online to view your results and a brief explanation. Also, we can discuss results at next follow-up visit.    Please schedule a Follow-up Appointment to: Return in about 4 months (around 07/27/2020) for 4 month fasting lab then 1 week later DM, Cholesterol med adjust.  If you have any other questions or concerns, please feel free to call the office or send a message through MyChart. You may also schedule an earlier appointment if necessary.  Additionally, you may be receiving a survey about your experience at our office within a few days to 1 week by e-mail or mail. We value your feedback.  Denman George, DO [facility], 07/29/2020

## 2020-03-26 NOTE — Assessment & Plan Note (Signed)
Uncontrolled cholesterol, not on statin Last lipid panel 03/2020 LDL 179 The 10-year ASCVD risk score Dean George DC Jr., et al., 2013) is: 24.6%  Plan: 1. Discussed ASCVD risk / treat LDL - NEW START Statin therapy - Rosuvastatin 10mg  nightly - counseling on side effect benefits dosing 2. Encourage improved lifestyle - low carb/cholesterol, reduce portion size, continue improving regular exercise  F/u 4 month for lipid and chemistry

## 2020-04-19 DIAGNOSIS — R202 Paresthesia of skin: Secondary | ICD-10-CM | POA: Diagnosis not present

## 2020-04-19 DIAGNOSIS — R030 Elevated blood-pressure reading, without diagnosis of hypertension: Secondary | ICD-10-CM | POA: Diagnosis not present

## 2020-04-19 DIAGNOSIS — G950 Syringomyelia and syringobulbia: Secondary | ICD-10-CM | POA: Diagnosis not present

## 2020-04-19 DIAGNOSIS — M5440 Lumbago with sciatica, unspecified side: Secondary | ICD-10-CM | POA: Diagnosis not present

## 2020-04-19 DIAGNOSIS — Z6834 Body mass index (BMI) 34.0-34.9, adult: Secondary | ICD-10-CM | POA: Diagnosis not present

## 2020-04-27 ENCOUNTER — Other Ambulatory Visit: Payer: Self-pay | Admitting: Family Medicine

## 2020-04-27 DIAGNOSIS — G8929 Other chronic pain: Secondary | ICD-10-CM

## 2020-04-27 DIAGNOSIS — M4726 Other spondylosis with radiculopathy, lumbar region: Secondary | ICD-10-CM

## 2020-04-27 NOTE — Telephone Encounter (Signed)
Requested medication (s) are due for refill today: Yes  Requested medication (s) are on the active medication list: Yes  Last refill:  12/09/19  Future visit scheduled: Yes  Notes to clinic:  Unable to refill per protocol, cannot delegate     Requested Prescriptions  Pending Prescriptions Disp Refills   cyclobenzaprine (FLEXERIL) 10 MG tablet [Pharmacy Med Name: CYCLOBENZAPRINE HCL 10 MG TAB] 90 tablet 2    Sig: TAKE 1 TABLET BY MOUTH 3 TIMES DAILY AS NEEDED FOR MUSCLE SPASMS      Not Delegated - Analgesics:  Muscle Relaxants Failed - 04/27/2020  5:55 PM      Failed - This refill cannot be delegated      Passed - Valid encounter within last 6 months    Recent Outpatient Visits           1 month ago Type 2 diabetes mellitus with diabetic neuropathy, without long-term current use of insulin [facility])   [facility] Smitty Cords, DO   3 months ago Essential hypertension   [facility] [place], Netta Neat, DO   4 months ago Chronic bilateral low back pain with bilateral sciatica   [facility] Smitty Cords, DO   7 months ago Chronic bilateral low back pain with bilateral sciatica   [facility] Smitty Cords, DO   1 year ago Sore throat   [facility] [place], Netta Neat, [place]

## 2020-05-02 DIAGNOSIS — M4696 Unspecified inflammatory spondylopathy, lumbar region: Secondary | ICD-10-CM | POA: Insufficient documentation

## 2020-05-02 DIAGNOSIS — R030 Elevated blood-pressure reading, without diagnosis of hypertension: Secondary | ICD-10-CM | POA: Diagnosis not present

## 2020-05-02 DIAGNOSIS — Z6835 Body mass index (BMI) 35.0-35.9, adult: Secondary | ICD-10-CM | POA: Diagnosis not present

## 2020-05-17 DIAGNOSIS — M4807 Spinal stenosis, lumbosacral region: Secondary | ICD-10-CM | POA: Diagnosis not present

## 2020-05-17 DIAGNOSIS — I10 Essential (primary) hypertension: Secondary | ICD-10-CM | POA: Diagnosis not present

## 2020-05-17 DIAGNOSIS — Z6834 Body mass index (BMI) 34.0-34.9, adult: Secondary | ICD-10-CM | POA: Diagnosis not present

## 2020-05-22 ENCOUNTER — Other Ambulatory Visit: Payer: Self-pay | Admitting: Family Medicine

## 2020-05-22 DIAGNOSIS — M4726 Other spondylosis with radiculopathy, lumbar region: Secondary | ICD-10-CM

## 2020-05-22 DIAGNOSIS — M5442 Lumbago with sciatica, left side: Secondary | ICD-10-CM

## 2020-05-22 MED ORDER — GABAPENTIN 600 MG PO TABS: 1200.0000 mg | ORAL_TABLET | Freq: Three times a day (TID) | ORAL | 3 refills | Status: DC

## 2020-05-22 NOTE — Telephone Encounter (Signed)
Requested Prescriptions  Pending Prescriptions Disp Refills  . gabapentin (NEURONTIN) 600 MG tablet 180 tablet 3    Sig: Take 2 tablets (1,200 mg total) by mouth 3 (three) times daily. Max 6 pills per 24 hours     Neurology: Anticonvulsants - gabapentin Passed - 05/22/2020 12:56 PM      Passed - Valid encounter within last 12 months    Recent Outpatient Visits          1 month ago Type 2 diabetes mellitus with diabetic neuropathy, without long-term current use of insulin [facility])   [facility] [place], Netta Neat, DO   4 months ago Essential hypertension   [facility] [place], Netta Neat, DO   5 months ago Chronic bilateral low back pain with bilateral sciatica   [facility] Smitty Cords, DO   8 months ago Chronic bilateral low back pain with bilateral sciatica   [facility] Smitty Cords, DO   1 year ago Sore throat   [facility] [place], Netta Neat, [place]

## 2020-05-22 NOTE — Telephone Encounter (Signed)
gabapentin (NEURONTIN) 600 MG tablet Medication Date: 12/09/2019 Department: [facility] Ordering/Authorizing: Smitty Cords, DO   Pt states just seen end of Oct 21 would please refill as no more refills?  [facility] - [place], [place] - [address] Phone:  [phone]  Fax:  [phone]

## 2020-05-25 ENCOUNTER — Other Ambulatory Visit: Payer: Self-pay | Admitting: Neurosurgery

## 2020-05-29 ENCOUNTER — Ambulatory Visit: Payer: Medicaid Other | Admitting: Family Medicine

## 2020-06-20 ENCOUNTER — Telehealth: Payer: Self-pay

## 2020-06-20 ENCOUNTER — Other Ambulatory Visit (HOSPITAL_COMMUNITY): Payer: Medicaid Other

## 2020-06-20 ENCOUNTER — Inpatient Hospital Stay (HOSPITAL_COMMUNITY): Admission: RE | Admit: 2020-06-20 | Payer: Medicaid Other | Source: Ambulatory Visit

## 2020-06-20 NOTE — Telephone Encounter (Signed)
Copied from CRM [id number]. Topic: General - Other >> Jun 20, 2020  9:18 AM Marylen Ponto wrote: Reason for CRM: Pt stated he is scheduled for surgery and would like to come by the office to pick up a list of his medications. Pt stated he will be leaving home in the next 15 minutes

## 2020-07-24 NOTE — Progress Notes (Signed)
Surgical Instructions    Your procedure is scheduled on 07/27/20.  Report to [facility] Main Entrance "A" at 05:30 A.M., then check in with the Admitting office.  Call this number if you have problems the morning of surgery:  [phone]   If you have any questions prior to your surgery date call [phone]: Open Monday-Friday 8am-4pm    Remember:  Do not eat or drink after midnight the night before your surgery     Take these medicines the morning of surgery with A SIP OF WATER  cyclobenzaprine (FLEXERIL)- if needed FLUoxetine (PROZAC) fluticasone (FLONASE)- if needed gabapentin (NEURONTIN)  pantoprazole (PROTONIX)  oxyCODONE-acetaminophen (PERCOCET/ROXICET) - IF NEEDED  As of today, STOP taking any Aspirin (unless otherwise instructed by your surgeon) Aleve, Naproxen, Ibuprofen, Motrin, Advil, Goody's, BC's, all herbal medications, fish oil, and all vitamins.  DO NOT TAKE METFORMIN (GLUCOPHAGE) THE DAY OF SURGERY.                      Do not wear jewelry, make up, or nail polish            Do not wear lotions, powders, perfumes/colognes, or deodorant.            Do not shave 48 hours prior to surgery.  Men may shave face and neck.            Do not bring valuables to the hospital.            [facility] is not responsible for any belongings or valuables.  Do NOT Smoke (Tobacco/Vaping) or drink Alcohol 24 hours prior to your procedure If you use a CPAP at night, you may bring all equipment for your overnight stay.   Contacts, glasses, dentures or bridgework may not be worn into surgery, please bring cases for these belongings   For patients admitted to the hospital, discharge time will be determined by your treatment team.   Patients discharged the day of surgery will not be allowed to drive home, and someone needs to stay with them for 24 hours.    Special instructions:   [place]- Preparing For Surgery  Before surgery, you can play an important role. Because  skin is not sterile, your skin needs to be as free of germs as possible. You can reduce the number of germs on your skin by washing with CHG (chlorahexidine gluconate) Soap before surgery.  CHG is an antiseptic cleaner which kills germs and bonds with the skin to continue killing germs even after washing.    Oral Hygiene is also important to reduce your risk of infection.  Remember - BRUSH YOUR TEETH THE MORNING OF SURGERY WITH YOUR REGULAR TOOTHPASTE  Please do not use if you have an allergy to CHG or antibacterial soaps. If your skin becomes reddened/irritated stop using the CHG.  Do not shave (including legs and underarms) for at least 48 hours prior to first CHG shower. It is OK to shave your face.  Please follow these instructions carefully.   1. Shower the NIGHT BEFORE SURGERY and the MORNING OF SURGERY  2. If you chose to wash your hair, wash your hair first as usual with your normal shampoo.  3. After you shampoo, rinse your hair and body thoroughly to remove the shampoo.  4. Wash Face and genitals (private parts) with your normal soap.   5.  Shower the NIGHT BEFORE SURGERY and the MORNING OF SURGERY with CHG Soap.   6. Use  CHG Soap as you would any other liquid soap. You can apply CHG directly to the skin and wash gently with a scrungie or a clean washcloth.   7. Apply the CHG Soap to your body ONLY FROM THE NECK DOWN.  Do not use on open wounds or open sores. Avoid contact with your eyes, ears, mouth and genitals (private parts). Wash Face and genitals (private parts)  with your normal soap.   8. Wash thoroughly, paying special attention to the area where your surgery will be performed.  9. Thoroughly rinse your body with warm water from the neck down.  10. DO NOT shower/wash with your normal soap after using and rinsing off the CHG Soap.  11. Pat yourself dry with a CLEAN TOWEL.  12. Wear CLEAN PAJAMAS to bed the night before surgery  13. Place CLEAN SHEETS on your bed the  night before your surgery  14. DO NOT SLEEP WITH PETS.   Day of Surgery: Take a shower.  Wear Clean/Comfortable clothing the morning of surgery Do not apply any deodorants/lotions.   Remember to brush your teeth WITH YOUR REGULAR TOOTHPASTE.   Please read over the following fact sheets that you were given.

## 2020-07-25 ENCOUNTER — Encounter (HOSPITAL_COMMUNITY): Payer: Self-pay

## 2020-07-25 ENCOUNTER — Other Ambulatory Visit: Payer: Self-pay

## 2020-07-25 ENCOUNTER — Other Ambulatory Visit (HOSPITAL_COMMUNITY)
Admission: RE | Admit: 2020-07-25 | Discharge: 2020-07-25 | Disposition: A | Discharge status: Home / Self Care | Payer: Medicaid Other | Source: Ambulatory Visit | Attending: Neurosurgery | Admitting: Neurosurgery

## 2020-07-25 ENCOUNTER — Encounter (HOSPITAL_COMMUNITY)
Admission: RE | Admit: 2020-07-25 | Discharge: 2020-07-25 | Disposition: A | Discharge status: Home / Self Care | Payer: Medicaid Other | Source: Ambulatory Visit | Attending: Neurosurgery | Admitting: Neurosurgery

## 2020-07-25 DIAGNOSIS — Z20822 Contact with and (suspected) exposure to covid-19: Secondary | ICD-10-CM | POA: Insufficient documentation

## 2020-07-25 DIAGNOSIS — Z01812 Encounter for preprocedural laboratory examination: Secondary | ICD-10-CM | POA: Insufficient documentation

## 2020-07-25 DIAGNOSIS — Z01818 Encounter for other preprocedural examination: Secondary | ICD-10-CM | POA: Insufficient documentation

## 2020-07-25 LAB — CBC
HCT: 38.9 % — ABNORMAL LOW (ref 39.0–52.0)
Hemoglobin: 12.9 g/dL — ABNORMAL LOW (ref 13.0–17.0)
MCH: 29.9 pg (ref 26.0–34.0)
MCHC: 33.2 g/dL (ref 30.0–36.0)
MCV: 90.3 fL (ref 80.0–100.0)
Platelets: 208 10*3/uL (ref 150–400)
RBC: 4.31 MIL/uL (ref 4.22–5.81)
RDW: 11.7 % (ref 11.5–15.5)
WBC: 5.9 10*3/uL (ref 4.0–10.5)
nRBC: 0 % (ref 0.0–0.2)

## 2020-07-25 LAB — COMPREHENSIVE METABOLIC PANEL
ALT: 29 U/L (ref 0–44)
AST: 24 U/L (ref 15–41)
Albumin: 3.3 g/dL — ABNORMAL LOW (ref 3.5–5.0)
Alkaline Phosphatase: 59 U/L (ref 38–126)
Anion gap: 8 (ref 5–15)
BUN: 9 mg/dL (ref 6–20)
CO2: 30 mmol/L (ref 22–32)
Calcium: 9.1 mg/dL (ref 8.9–10.3)
Chloride: 99 mmol/L (ref 98–111)
Creatinine, Ser: 0.85 mg/dL (ref 0.61–1.24)
GFR, Estimated: >60 mL/min (ref >60)
Glucose, Bld: 220 mg/dL — ABNORMAL HIGH (ref 70–99)
Potassium: 3.8 mmol/L (ref 3.5–5.1)
Sodium: 137 mmol/L (ref 135–145)
Total Bilirubin: 0.8 mg/dL (ref 0.3–1.2)
Total Protein: 6.1 g/dL — ABNORMAL LOW (ref 6.5–8.1)

## 2020-07-25 LAB — HEMOGLOBIN A1C
Hgb A1c MFr Bld: 7.8 % — ABNORMAL HIGH (ref 4.8–5.6)
Mean Plasma Glucose: 177.16 mg/dL

## 2020-07-25 LAB — SURGICAL PCR SCREEN
MRSA, PCR: POSITIVE — AB
Staphylococcus aureus: POSITIVE — AB

## 2020-07-25 LAB — SARS CORONAVIRUS 2 (TAT 6-24 HRS): SARS Coronavirus 2: NEGATIVE

## 2020-07-25 LAB — GLUCOSE, CAPILLARY: Glucose-Capillary: 228 mg/dL — ABNORMAL HIGH (ref 70–99)

## 2020-07-25 NOTE — Progress Notes (Signed)
PCP - Saralyn Pilar Cardiologist - denies  PPM/ICD - denies   Chest x-ray - n/a EKG - 07/25/20 Stress Test - denies ECHO - denies Cardiac Cath - denies  Sleep Study - denies   Does not check blood sugar at home. Does not know how to operate glucometer.   Patient instructed to hold all Aspirin, NSAID's, herbal medications, fish oil and vitamins 7 days prior to surgery.   ERAS Protcol -no   COVID TEST- 07/25/20   Anesthesia review: no  Patient denies shortness of breath, fever, cough and chest pain at PAT appointment   All instructions explained to the patient, with a verbal understanding of the material. Patient agrees to go over the instructions while at home for a better understanding. Patient also instructed to self quarantine after being tested for COVID-19. The opportunity to ask questions was provided.

## 2020-07-25 NOTE — Progress Notes (Signed)
Office notified of positive MRSA and Staph result on mrsa swab. Voicemail left as well as staff message sent.

## 2020-07-26 MED ORDER — DEXTROSE 5 % IV SOLN
3.0000 g | INTRAVENOUS | Status: DC
  Filled 2020-07-26: qty 3000

## 2020-07-26 MED ORDER — VANCOMYCIN HCL 1500 MG/300ML IV SOLN
1500.0000 mg | INTRAVENOUS | Status: AC
  Administered 2020-07-27: 1500 mg via INTRAVENOUS
  Filled 2020-07-26: qty 300

## 2020-07-27 ENCOUNTER — Encounter (HOSPITAL_COMMUNITY): Payer: Self-pay | Admitting: Neurosurgery

## 2020-07-27 ENCOUNTER — Other Ambulatory Visit: Payer: Self-pay

## 2020-07-27 ENCOUNTER — Ambulatory Visit (HOSPITAL_COMMUNITY)
Admission: RE | Disposition: A | Discharge status: Home / Self Care | Payer: Self-pay | Source: Home / Self Care | Attending: Neurosurgery

## 2020-07-27 ENCOUNTER — Ambulatory Visit (HOSPITAL_COMMUNITY): Payer: Medicaid Other | Admitting: Certified Registered Nurse Anesthetist

## 2020-07-27 ENCOUNTER — Ambulatory Visit (HOSPITAL_COMMUNITY): Payer: Medicaid Other

## 2020-07-27 ENCOUNTER — Ambulatory Visit (HOSPITAL_COMMUNITY)
Admission: RE | Admit: 2020-07-27 | Discharge: 2020-07-27 | Disposition: A | Discharge status: Home / Self Care | Payer: Medicaid Other | Attending: Neurosurgery | Admitting: Neurosurgery

## 2020-07-27 DIAGNOSIS — Z4889 Encounter for other specified surgical aftercare: Secondary | ICD-10-CM | POA: Diagnosis not present

## 2020-07-27 DIAGNOSIS — Z981 Arthrodesis status: Secondary | ICD-10-CM | POA: Diagnosis not present

## 2020-07-27 DIAGNOSIS — M48062 Spinal stenosis, lumbar region with neurogenic claudication: Secondary | ICD-10-CM | POA: Insufficient documentation

## 2020-07-27 DIAGNOSIS — Z419 Encounter for procedure for purposes other than remedying health state, unspecified: Secondary | ICD-10-CM

## 2020-07-27 DIAGNOSIS — M5117 Intervertebral disc disorders with radiculopathy, lumbosacral region: Secondary | ICD-10-CM | POA: Diagnosis not present

## 2020-07-27 DIAGNOSIS — M4726 Other spondylosis with radiculopathy, lumbar region: Secondary | ICD-10-CM | POA: Diagnosis not present

## 2020-07-27 DIAGNOSIS — Z7984 Long term (current) use of oral hypoglycemic drugs: Secondary | ICD-10-CM | POA: Diagnosis not present

## 2020-07-27 DIAGNOSIS — M5116 Intervertebral disc disorders with radiculopathy, lumbar region: Secondary | ICD-10-CM | POA: Diagnosis not present

## 2020-07-27 DIAGNOSIS — M48061 Spinal stenosis, lumbar region without neurogenic claudication: Secondary | ICD-10-CM | POA: Diagnosis not present

## 2020-07-27 DIAGNOSIS — Z79899 Other long term (current) drug therapy: Secondary | ICD-10-CM | POA: Insufficient documentation

## 2020-07-27 DIAGNOSIS — Z833 Family history of diabetes mellitus: Secondary | ICD-10-CM | POA: Insufficient documentation

## 2020-07-27 DIAGNOSIS — Z791 Long term (current) use of non-steroidal anti-inflammatories (NSAID): Secondary | ICD-10-CM | POA: Diagnosis not present

## 2020-07-27 DIAGNOSIS — F17210 Nicotine dependence, cigarettes, uncomplicated: Secondary | ICD-10-CM | POA: Insufficient documentation

## 2020-07-27 DIAGNOSIS — Z8249 Family history of ischemic heart disease and other diseases of the circulatory system: Secondary | ICD-10-CM | POA: Diagnosis not present

## 2020-07-27 DIAGNOSIS — M5126 Other intervertebral disc displacement, lumbar region: Secondary | ICD-10-CM | POA: Diagnosis present

## 2020-07-27 DIAGNOSIS — M4722 Other spondylosis with radiculopathy, cervical region: Secondary | ICD-10-CM | POA: Diagnosis not present

## 2020-07-27 DIAGNOSIS — E119 Type 2 diabetes mellitus without complications: Secondary | ICD-10-CM | POA: Diagnosis not present

## 2020-07-27 HISTORY — PX: LUMBAR LAMINECTOMY/DECOMPRESSION MICRODISCECTOMY: SHX5026

## 2020-07-27 LAB — GLUCOSE, CAPILLARY
Glucose-Capillary: 180 mg/dL — ABNORMAL HIGH (ref 70–99)
Glucose-Capillary: 207 mg/dL — ABNORMAL HIGH (ref 70–99)
Glucose-Capillary: 253 mg/dL — ABNORMAL HIGH (ref 70–99)

## 2020-07-27 SURGERY — LUMBAR LAMINECTOMY/DECOMPRESSION MICRODISCECTOMY 2 LEVELS
Anesthesia: General | Site: Back | Laterality: Bilateral

## 2020-07-27 MED ORDER — ONDANSETRON HCL 4 MG/2ML IJ SOLN
INTRAMUSCULAR | Status: DC | PRN
  Administered 2020-07-27: 4 mg via INTRAVENOUS

## 2020-07-27 MED ORDER — ACETAMINOPHEN 325 MG PO TABS: 650.0000 mg | ORAL_TABLET | ORAL | Status: DC | PRN

## 2020-07-27 MED ORDER — GABAPENTIN 600 MG PO TABS
1200.0000 mg | ORAL_TABLET | Freq: Three times a day (TID) | ORAL | Status: DC
  Administered 2020-07-27: 1200 mg via ORAL
  Filled 2020-07-27: qty 2

## 2020-07-27 MED ORDER — HYDROMORPHONE HCL 1 MG/ML IJ SOLN
INTRAMUSCULAR | Status: AC
  Filled 2020-07-27: qty 1

## 2020-07-27 MED ORDER — LACTATED RINGERS IV SOLN: INTRAVENOUS | Status: DC

## 2020-07-27 MED ORDER — CYCLOBENZAPRINE HCL 10 MG PO TABS
10.0000 mg | ORAL_TABLET | Freq: Three times a day (TID) | ORAL | Status: DC | PRN
  Administered 2020-07-27: 10 mg via ORAL

## 2020-07-27 MED ORDER — ACETAMINOPHEN 650 MG RE SUPP: 650.0000 mg | RECTAL | Status: DC | PRN

## 2020-07-27 MED ORDER — ACETAMINOPHEN 160 MG/5ML PO SOLN: 325.0000 mg | Freq: Once | ORAL | Status: DC | PRN

## 2020-07-27 MED ORDER — CEFAZOLIN SODIUM-DEXTROSE 2-4 GM/100ML-% IV SOLN
2.0000 g | Freq: Three times a day (TID) | INTRAVENOUS | Status: DC
  Administered 2020-07-27: 2 g via INTRAVENOUS
  Filled 2020-07-27: qty 100

## 2020-07-27 MED ORDER — THROMBIN 20000 UNITS EX KIT
PACK | CUTANEOUS | Status: DC | PRN
  Administered 2020-07-27: 5000 [IU] via TOPICAL

## 2020-07-27 MED ORDER — SODIUM CHLORIDE 0.9% FLUSH: 3.0000 mL | INTRAVENOUS | Status: DC | PRN

## 2020-07-27 MED ORDER — KETOROLAC TROMETHAMINE 60 MG/2ML IM SOLN
60.0000 mg | Freq: Once | INTRAMUSCULAR | Status: DC | PRN
  Filled 2020-07-27: qty 2

## 2020-07-27 MED ORDER — ORAL CARE MOUTH RINSE: 15.0000 mL | Freq: Once | OROMUCOSAL | Status: AC

## 2020-07-27 MED ORDER — HEMOSTATIC AGENTS (NO CHARGE) OPTIME
TOPICAL | Status: DC | PRN
  Administered 2020-07-27: 1 via TOPICAL

## 2020-07-27 MED ORDER — AMISULPRIDE (ANTIEMETIC) 5 MG/2ML IV SOLN: 10.0000 mg | Freq: Once | INTRAVENOUS | Status: DC | PRN

## 2020-07-27 MED ORDER — KETAMINE HCL 50 MG/5ML IJ SOSY
PREFILLED_SYRINGE | INTRAMUSCULAR | Status: AC
  Filled 2020-07-27: qty 5

## 2020-07-27 MED ORDER — LIDOCAINE 2% (20 MG/ML) 5 ML SYRINGE
INTRAMUSCULAR | Status: DC | PRN
  Administered 2020-07-27: 40 mg via INTRAVENOUS

## 2020-07-27 MED ORDER — BUPIVACAINE HCL (PF) 0.25 % IJ SOLN
INTRAMUSCULAR | Status: DC | PRN
  Administered 2020-07-27: 10 mL

## 2020-07-27 MED ORDER — SUGAMMADEX SODIUM 200 MG/2ML IV SOLN
INTRAVENOUS | Status: DC | PRN
  Administered 2020-07-27: 300 mg via INTRAVENOUS

## 2020-07-27 MED ORDER — ALBUTEROL SULFATE HFA 108 (90 BASE) MCG/ACT IN AERS
INHALATION_SPRAY | RESPIRATORY_TRACT | Status: DC | PRN
Start: 2020-07-27 — End: 2020-07-27
  Administered 2020-07-27: 6 via RESPIRATORY_TRACT

## 2020-07-27 MED ORDER — LIDOCAINE-EPINEPHRINE 1 %-1:100000 IJ SOLN
INTRAMUSCULAR | Status: AC
  Filled 2020-07-27: qty 1

## 2020-07-27 MED ORDER — CHLORHEXIDINE GLUCONATE CLOTH 2 % EX PADS: 6.0000 | MEDICATED_PAD | Freq: Once | CUTANEOUS | Status: DC

## 2020-07-27 MED ORDER — FLUOXETINE HCL 20 MG PO CAPS
20.0000 mg | ORAL_CAPSULE | Freq: Every day | ORAL | Status: DC
  Administered 2020-07-27: 20 mg via ORAL
  Filled 2020-07-27: qty 1

## 2020-07-27 MED ORDER — SODIUM CHLORIDE 0.9% FLUSH: 3.0000 mL | Freq: Two times a day (BID) | INTRAVENOUS | Status: DC

## 2020-07-27 MED ORDER — CHLORHEXIDINE GLUCONATE 0.12 % MT SOLN
15.0000 mL | Freq: Once | OROMUCOSAL | Status: AC
  Administered 2020-07-27: 15 mL via OROMUCOSAL
  Filled 2020-07-27: qty 15

## 2020-07-27 MED ORDER — OXYCODONE HCL 5 MG PO TABS
ORAL_TABLET | ORAL | Status: AC
  Filled 2020-07-27: qty 2

## 2020-07-27 MED ORDER — LIDOCAINE-EPINEPHRINE 1 %-1:100000 IJ SOLN
INTRAMUSCULAR | Status: DC | PRN
  Administered 2020-07-27: 10 mL

## 2020-07-27 MED ORDER — MEPERIDINE HCL 25 MG/ML IJ SOLN: 6.2500 mg | INTRAMUSCULAR | Status: DC | PRN

## 2020-07-27 MED ORDER — ACETAMINOPHEN 325 MG PO TABS: 325.0000 mg | ORAL_TABLET | Freq: Once | ORAL | Status: DC | PRN

## 2020-07-27 MED ORDER — LOSARTAN POTASSIUM 50 MG PO TABS
25.0000 mg | ORAL_TABLET | Freq: Every day | ORAL | Status: DC
  Administered 2020-07-27: 25 mg via ORAL
  Filled 2020-07-27: qty 1

## 2020-07-27 MED ORDER — DEXAMETHASONE SODIUM PHOSPHATE 10 MG/ML IJ SOLN
10.0000 mg | Freq: Once | INTRAMUSCULAR | Status: AC
  Administered 2020-07-27: 5 mg via INTRAVENOUS

## 2020-07-27 MED ORDER — ALUM & MAG HYDROXIDE-SIMETH 200-200-20 MG/5ML PO SUSP: 30.0000 mL | Freq: Four times a day (QID) | ORAL | Status: DC | PRN

## 2020-07-27 MED ORDER — SODIUM CHLORIDE 0.9 % IV SOLN: 250.0000 mL | INTRAVENOUS | Status: DC

## 2020-07-27 MED ORDER — MENTHOL 3 MG MT LOZG: 1.0000 | LOZENGE | OROMUCOSAL | Status: DC | PRN

## 2020-07-27 MED ORDER — PANTOPRAZOLE SODIUM 40 MG IV SOLR: 40.0000 mg | Freq: Every day | INTRAVENOUS | Status: DC

## 2020-07-27 MED ORDER — CYCLOBENZAPRINE HCL 10 MG PO TABS: 10.0000 mg | ORAL_TABLET | Freq: Three times a day (TID) | ORAL | Status: DC | PRN

## 2020-07-27 MED ORDER — FENTANYL CITRATE (PF) 250 MCG/5ML IJ SOLN
INTRAMUSCULAR | Status: DC | PRN
  Administered 2020-07-27: 25 ug via INTRAVENOUS
  Administered 2020-07-27: 50 ug via INTRAVENOUS
  Administered 2020-07-27: 25 ug via INTRAVENOUS
  Administered 2020-07-27 (×3): 50 ug via INTRAVENOUS

## 2020-07-27 MED ORDER — ACETAMINOPHEN 10 MG/ML IV SOLN: 1000.0000 mg | Freq: Once | INTRAVENOUS | Status: DC | PRN

## 2020-07-27 MED ORDER — PROPOFOL 10 MG/ML IV BOLUS
INTRAVENOUS | Status: DC | PRN
Start: 2020-07-27 — End: 2020-07-27
  Administered 2020-07-27: 200 mg via INTRAVENOUS

## 2020-07-27 MED ORDER — THROMBIN 5000 UNITS EX SOLR
CUTANEOUS | Status: AC
  Filled 2020-07-27: qty 5000

## 2020-07-27 MED ORDER — METFORMIN HCL ER 500 MG PO TB24: 1500.0000 mg | ORAL_TABLET | Freq: Every day | ORAL | Status: DC

## 2020-07-27 MED ORDER — FENTANYL CITRATE (PF) 250 MCG/5ML IJ SOLN
INTRAMUSCULAR | Status: AC
  Filled 2020-07-27: qty 5

## 2020-07-27 MED ORDER — MIDAZOLAM HCL 2 MG/2ML IJ SOLN
INTRAMUSCULAR | Status: AC
  Filled 2020-07-27: qty 2

## 2020-07-27 MED ORDER — CYCLOBENZAPRINE HCL 10 MG PO TABS
ORAL_TABLET | ORAL | Status: AC
  Filled 2020-07-27: qty 1

## 2020-07-27 MED ORDER — DEXAMETHASONE SODIUM PHOSPHATE 10 MG/ML IJ SOLN
INTRAMUSCULAR | Status: AC
  Filled 2020-07-27: qty 1

## 2020-07-27 MED ORDER — ONDANSETRON HCL 4 MG/2ML IJ SOLN
INTRAMUSCULAR | Status: AC
  Filled 2020-07-27: qty 2

## 2020-07-27 MED ORDER — PROPOFOL 10 MG/ML IV BOLUS
INTRAVENOUS | Status: AC
  Filled 2020-07-27: qty 20

## 2020-07-27 MED ORDER — LIDOCAINE 2% (20 MG/ML) 5 ML SYRINGE
INTRAMUSCULAR | Status: AC
  Filled 2020-07-27: qty 5

## 2020-07-27 MED ORDER — PHENOL 1.4 % MT LIQD: 1.0000 | OROMUCOSAL | Status: DC | PRN

## 2020-07-27 MED ORDER — KETAMINE HCL 10 MG/ML IJ SOLN
INTRAMUSCULAR | Status: DC | PRN
  Administered 2020-07-27: 50 mg via INTRAVENOUS

## 2020-07-27 MED ORDER — OXYCODONE-ACETAMINOPHEN 5-325 MG PO TABS: 1.0000 | ORAL_TABLET | Freq: Four times a day (QID) | ORAL | Status: DC | PRN

## 2020-07-27 MED ORDER — ROCURONIUM BROMIDE 10 MG/ML (PF) SYRINGE
PREFILLED_SYRINGE | INTRAVENOUS | Status: DC | PRN
  Administered 2020-07-27: 60 mg via INTRAVENOUS
  Administered 2020-07-27: 20 mg via INTRAVENOUS

## 2020-07-27 MED ORDER — ONDANSETRON HCL 4 MG PO TABS: 4.0000 mg | ORAL_TABLET | Freq: Four times a day (QID) | ORAL | Status: DC | PRN

## 2020-07-27 MED ORDER — ROCURONIUM BROMIDE 10 MG/ML (PF) SYRINGE
PREFILLED_SYRINGE | INTRAVENOUS | Status: AC
  Filled 2020-07-27: qty 10

## 2020-07-27 MED ORDER — ONDANSETRON HCL 4 MG/2ML IJ SOLN: 4.0000 mg | Freq: Four times a day (QID) | INTRAMUSCULAR | Status: DC | PRN

## 2020-07-27 MED ORDER — HYDROMORPHONE HCL 1 MG/ML IJ SOLN
0.2500 mg | INTRAMUSCULAR | Status: DC | PRN
  Administered 2020-07-27 (×4): 0.5 mg via INTRAVENOUS

## 2020-07-27 MED ORDER — BUPIVACAINE HCL (PF) 0.25 % IJ SOLN
INTRAMUSCULAR | Status: AC
  Filled 2020-07-27: qty 30

## 2020-07-27 MED ORDER — OXYCODONE HCL 5 MG PO TABS
10.0000 mg | ORAL_TABLET | ORAL | Status: DC | PRN
  Administered 2020-07-27 (×2): 10 mg via ORAL
  Filled 2020-07-27: qty 2

## 2020-07-27 MED ORDER — HYDROMORPHONE HCL 1 MG/ML IJ SOLN
0.5000 mg | INTRAMUSCULAR | Status: DC | PRN
  Administered 2020-07-27: 0.5 mg via INTRAVENOUS
  Filled 2020-07-27: qty 0.5

## 2020-07-27 MED ORDER — PANTOPRAZOLE SODIUM 40 MG PO TBEC
40.0000 mg | DELAYED_RELEASE_TABLET | Freq: Every day | ORAL | Status: DC
  Administered 2020-07-27: 40 mg via ORAL
  Filled 2020-07-27: qty 1

## 2020-07-27 MED ORDER — MIDAZOLAM HCL 2 MG/2ML IJ SOLN
INTRAMUSCULAR | Status: DC | PRN
  Administered 2020-07-27: 2 mg via INTRAVENOUS

## 2020-07-27 MED ORDER — ROSUVASTATIN CALCIUM 5 MG PO TABS: 10.0000 mg | ORAL_TABLET | Freq: Every day | ORAL | Status: DC

## 2020-07-27 MED ORDER — PHENYLEPHRINE 40 MCG/ML (10ML) SYRINGE FOR IV PUSH (FOR BLOOD PRESSURE SUPPORT)
PREFILLED_SYRINGE | INTRAVENOUS | Status: AC
  Filled 2020-07-27: qty 10

## 2020-07-27 MED ORDER — 0.9 % SODIUM CHLORIDE (POUR BTL) OPTIME
TOPICAL | Status: DC | PRN
  Administered 2020-07-27: 1000 mL

## 2020-07-27 MED ORDER — THROMBIN 5000 UNITS EX SOLR
CUTANEOUS | Status: DC | PRN
  Administered 2020-07-27: 5000 [IU] via TOPICAL

## 2020-07-27 MED ORDER — ACETAMINOPHEN 10 MG/ML IV SOLN
INTRAVENOUS | Status: DC | PRN
  Administered 2020-07-27: 1000 mg via INTRAVENOUS

## 2020-07-27 SURGICAL SUPPLY — 51 items
BAND RUBBER #18 3X1/16 STRL (MISCELLANEOUS) ×4 IMPLANT
BENZOIN TINCTURE PRP APPL 2/3 (GAUZE/BANDAGES/DRESSINGS) ×2 IMPLANT
BLADE CLIPPER SURG (BLADE) ×2 IMPLANT
BLADE SURG 11 STRL SS (BLADE) ×2 IMPLANT
BUR CUTTER 7.0 ROUND (BURR) ×2 IMPLANT
BUR MATCHSTICK NEURO 3.0 LAGG (BURR) ×2 IMPLANT
CANISTER SUCT 3000ML PPV (MISCELLANEOUS) ×2 IMPLANT
CARTRIDGE OIL MAESTRO DRILL (MISCELLANEOUS) ×1 IMPLANT
COVER WAND RF STERILE (DRAPES) IMPLANT
DECANTER SPIKE VIAL GLASS SM (MISCELLANEOUS) IMPLANT
DERMABOND ADVANCED (GAUZE/BANDAGES/DRESSINGS) ×1
DERMABOND ADVANCED .7 DNX12 (GAUZE/BANDAGES/DRESSINGS) ×1 IMPLANT
DIFFUSER DRILL AIR PNEUMATIC (MISCELLANEOUS) ×2 IMPLANT
DRAPE HALF SHEET 40X57 (DRAPES) IMPLANT
DRAPE LAPAROTOMY 100X72X124 (DRAPES) ×2 IMPLANT
DRAPE MICROSCOPE LEICA (MISCELLANEOUS) ×2 IMPLANT
DRAPE SURG 17X23 STRL (DRAPES) ×2 IMPLANT
DRSG OPSITE POSTOP 4X6 (GAUZE/BANDAGES/DRESSINGS) ×2 IMPLANT
DURAPREP 26ML APPLICATOR (WOUND CARE) ×2 IMPLANT
ELECT REM PT RETURN 9FT ADLT (ELECTROSURGICAL) ×2
ELECTRODE REM PT RTRN 9FT ADLT (ELECTROSURGICAL) ×1 IMPLANT
GAUZE 4X4 16PLY RFD (DISPOSABLE) IMPLANT
GAUZE SPONGE 4X4 12PLY STRL (GAUZE/BANDAGES/DRESSINGS) IMPLANT
GLOVE BIO SURGEON STRL SZ7 (GLOVE) ×2 IMPLANT
GLOVE BIO SURGEON STRL SZ8 (GLOVE) ×4 IMPLANT
GLOVE EXAM NITRILE XL STR (GLOVE) IMPLANT
GLOVE INDICATOR 8.5 STRL (GLOVE) ×4 IMPLANT
GLOVE SURG PR MICRO ENCORE 7 (GLOVE) ×2 IMPLANT
GLOVE SURG UNDER POLY LF SZ6.5 (GLOVE) ×4 IMPLANT
GLOVE SURG UNDER POLY LF SZ7 (GLOVE) IMPLANT
GOWN STRL REUS W/ TWL LRG LVL3 (GOWN DISPOSABLE) ×2 IMPLANT
GOWN STRL REUS W/ TWL XL LVL3 (GOWN DISPOSABLE) ×2 IMPLANT
GOWN STRL REUS W/TWL 2XL LVL3 (GOWN DISPOSABLE) IMPLANT
GOWN STRL REUS W/TWL LRG LVL3 (GOWN DISPOSABLE) ×2
GOWN STRL REUS W/TWL XL LVL3 (GOWN DISPOSABLE) ×2
KIT BASIN OR (CUSTOM PROCEDURE TRAY) ×2 IMPLANT
KIT TURNOVER KIT B (KITS) ×2 IMPLANT
NEEDLE HYPO 22GX1.5 SAFETY (NEEDLE) ×2 IMPLANT
NEEDLE SPNL 22GX3.5 QUINCKE BK (NEEDLE) ×2 IMPLANT
NS IRRIG 1000ML POUR BTL (IV SOLUTION) ×2 IMPLANT
OIL CARTRIDGE MAESTRO DRILL (MISCELLANEOUS) ×2
PACK LAMINECTOMY NEURO (CUSTOM PROCEDURE TRAY) ×2 IMPLANT
SPONGE SURGIFOAM ABS GEL SZ50 (HEMOSTASIS) ×2 IMPLANT
STRIP CLOSURE SKIN 1/2X4 (GAUZE/BANDAGES/DRESSINGS) ×2 IMPLANT
SUT VIC AB 0 CT1 18XCR BRD8 (SUTURE) ×1 IMPLANT
SUT VIC AB 0 CT1 8-18 (SUTURE) ×1
SUT VIC AB 2-0 CT1 18 (SUTURE) ×2 IMPLANT
SUT VICRYL 4-0 PS2 18IN ABS (SUTURE) ×2 IMPLANT
TOWEL GREEN STERILE (TOWEL DISPOSABLE) ×2 IMPLANT
TOWEL GREEN STERILE FF (TOWEL DISPOSABLE) ×2 IMPLANT
WATER STERILE IRR 1000ML POUR (IV SOLUTION) ×2 IMPLANT

## 2020-07-27 NOTE — Anesthesia Preprocedure Evaluation (Addendum)
Anesthesia Evaluation  Patient identified by MRN, date of birth, ID band Patient awake    Reviewed: Allergy & Precautions, NPO status , Patient's Chart, lab work & pertinent test results  Airway Mallampati: I  TM Distance: >3 FB Neck ROM: Full    Dental  (+) Edentulous Upper, Dental Advisory Given   Pulmonary Current Smoker and Patient abstained from smoking.,    breath sounds clear to auscultation       Cardiovascular hypertension, Pt. on medications  Rhythm:Regular Rate:Normal     Neuro/Psych PSYCHIATRIC DISORDERS Anxiety Depression    GI/Hepatic GERD  Medicated,  Endo/Other  diabetes, Type 2, Oral Hypoglycemic Agents  Renal/GU      Musculoskeletal  (+) Arthritis ,   Abdominal Normal abdominal exam  (+)   Peds  Hematology negative hematology ROS (+)   Anesthesia Other Findings - HLD  Reproductive/Obstetrics                            Anesthesia Physical Anesthesia Plan  ASA: II  Anesthesia Plan: General   Post-op Pain Management:    Induction: Intravenous  PONV Risk Score and Plan: 2 and Ondansetron, Dexamethasone and Midazolam  Airway Management Planned: Oral ETT  Additional Equipment: None  Intra-op Plan:   Post-operative Plan: Extubation in OR  Informed Consent: I have reviewed the patients History and Physical, chart, labs and discussed the procedure including the risks, benefits and alternatives for the proposed anesthesia with the patient or authorized representative who has indicated his/her understanding and acceptance.     Dental advisory given  Plan Discussed with: CRNA  Anesthesia Plan Comments:        Anesthesia Quick Evaluation

## 2020-07-27 NOTE — Transfer of Care (Signed)
Immediate Anesthesia Transfer of Care Note  Patient: Dean Clements [facility]  Procedure(s) Performed: Laminectomy Lumbar four-Lumbar five - bilateral, Microdiscectomy - Lumbar five-Sacral one - right (Bilateral Back)  Patient Location: PACU  Anesthesia Type:General  Level of Consciousness: awake, alert  and oriented  Airway & Oxygen Therapy: Patient Spontanous Breathing and Patient connected to face mask oxygen  Post-op Assessment: Report given to RN and Post -op Vital signs reviewed and stable  Post vital signs: Reviewed and stable  Last Vitals:  Vitals Value Taken Time  BP 176/74 07/27/20 0943  Temp    Pulse 90 07/27/20 0944  Resp 16 07/27/20 0944  SpO2 100 % 07/27/20 0944  Vitals shown include unvalidated device data.  Last Pain:  Vitals:   07/27/20 0620  TempSrc: Oral         Complications: No complications documented.

## 2020-07-27 NOTE — Discharge Instructions (Signed)

## 2020-07-27 NOTE — H&P (Signed)
Nigel Ericsson is an 55 y.o. male.   Chief Complaint: Back bilateral leg pain left greater than right HPI: 55 year old gentleman with progressive worsening back and bilateral leg pain worse rating down the left rating down L5-S1 nerve root pattern.  Patient also claudicating very short distances work-up revealed severe spinal stenosis at L4-5 and a disc herniation L5-S1 the right so I recommended bilateral decompressive laminotomy at L4-5 and a right-sided five one microdiscectomy.  I extensively went over the risks and benefits of that procedure with him as well as perioperative course expectations of outcome and alternatives of surgery and he understands and agrees to proceed forward.  Past Medical History:  Diagnosis Date  . Depression   . Diabetes mellitus without complication (HCC)    type 2  . GERD (gastroesophageal reflux disease)   . Hyperlipidemia   . Hypertension   . Low back pain 2004   occasional back flares each year.     Past Surgical History:  Procedure Laterality Date  . none    . TONSILLECTOMY     as a child  . WISDOM TOOTH EXTRACTION     as a teenager    Family History  Problem Relation Age of Onset  . Cancer Mother   . Heart disease Father   . Diabetes Father   . Hypertension Father    Social History:  reports that he has been smoking cigarettes. He has a 0.60 pack-year smoking history. He uses smokeless tobacco. He reports current alcohol use of about 28.0 standard drinks of alcohol per week. He reports current drug use. Drug: Marijuana.  Allergies: No Known Allergies  Medications Prior to Admission  Medication Sig Dispense Refill  . cyclobenzaprine (FLEXERIL) 10 MG tablet TAKE 1 TABLET BY MOUTH 3 TIMES DAILY AS NEEDED FOR MUSCLE SPASMS (Patient taking differently: Take 10 mg by mouth 3 (three) times daily as needed for muscle spasms.) 90 tablet 2  . FLUoxetine (PROZAC) 20 MG capsule TAKE 1 CAPSULE BY MOUTH TWICE DAILY (Patient taking differently:  Take 20 mg by mouth daily.) 180 capsule 1  . gabapentin (NEURONTIN) 600 MG tablet Take 2 tablets (1,200 mg total) by mouth 3 (three) times daily. Max 6 pills per 24 hours 180 tablet 3  . ketorolac (TORADOL) 60 MG/2ML SOLN injection Inject 2 mLs (60 mg total) into the muscle once as needed for up to 1 dose for moderate pain or severe pain. 2 mL 1  . losartan (COZAAR) 25 MG tablet Take 1 tablet (25 mg total) by mouth daily. 90 tablet 3  . metFORMIN (GLUCOPHAGE XR) 750 MG 24 hr tablet Take 2 tablets (1,500 mg total) by mouth daily with breakfast. 180 tablet 1  . oxyCODONE-acetaminophen (PERCOCET/ROXICET) 5-325 MG tablet Take 1 tablet by mouth 4 (four) times daily as needed for moderate pain.    . pantoprazole (PROTONIX) 40 MG tablet TAKE 1 TABLET BY MOUTH ONCE DAILY (Patient taking differently: Take 40 mg by mouth daily.) 90 tablet 3  . rosuvastatin (CRESTOR) 10 MG tablet Take 1 tablet (10 mg total) by mouth at bedtime. 90 tablet 1  . fluticasone (FLONASE) 50 MCG/ACT nasal spray Place 2 sprays into both nostrils daily. Use for 4-6 weeks then stop and use seasonally or as needed. (Patient not taking: No sig reported) 16 g 3    Results for orders placed or performed during the hospital encounter of 07/27/20 (from the past 48 hour(s))  Glucose, capillary     Status: Abnormal   Collection  Time: 07/27/20  6:23 AM  Result Value Ref Range   Glucose-Capillary 180 (H) 70 - 99 mg/dL    Comment: Glucose reference range applies only to samples taken after fasting for at least 8 hours.   No results found.  Review of Systems  Musculoskeletal: Positive for back pain and myalgias.  Neurological: Positive for numbness.    Blood pressure (!) 149/95, pulse 84, temperature 98.4 F (36.9 C), temperature source Oral, resp. rate 18, height 5\' 11"  (1.803 m), weight 117.9 kg, SpO2 98 %. Physical Exam HENT:     Head: Normocephalic.     Right Ear: Tympanic membrane normal.     Nose: Nose normal.  Eyes:      Pupils: Pupils are equal, round, and reactive to light.  Cardiovascular:     Rate and Rhythm: Normal rate.     Pulses: Normal pulses.  Pulmonary:     Effort: Pulmonary effort is normal.  Abdominal:     General: Abdomen is flat.  Musculoskeletal:        General: Normal range of motion.  Skin:    General: Skin is warm.  Neurological:     Mental Status: He is alert.     Comments: Patient awake alert strength is 5 out of 5 iliopsoas, quads, hamstrings, gastrocs, into tibialis, and EHL on right leg the left leg has some weak EHL with about four to 4+ out of 5      Assessment/Plan 55 year old presents for right-sided L5-S1 microdiscectomy bilateral decompressive laminectomy L4-5  57, MD 07/27/2020, 7:20 AM

## 2020-07-27 NOTE — Discharge Summary (Signed)
  Physician Discharge Summary  Patient ID: Dean Clements MRN: [id number] DOB/AGE: 1965/06/18 55 y.o. Estimated body mass index is 36.26 kg/m as calculated from the following:   Height as of this encounter: 5\' 11"  (1.803 m).   Weight as of this encounter: 117.9 kg.   Admit date: 07/27/2020 Discharge date: 07/27/2020  Admission Diagnoses: Lumbar spinal stenosis L4-5 herniated nucleus pulposus L5-S1  Discharge Diagnoses: Same Active Problems:   HNP (herniated nucleus pulposus), lumbar   Discharged Condition: good  Hospital Course: Patient was admitted to hospital underwent decompressive laminectomy bilaterally at L4-5 right-sided microdiscectomy postoperative patient did very well recovered and floor was ambulating and voiding spontaneously tolerating regular diet stable for discharge  Consults: Significant Diagnostic Studies: Treatments: Decompressive laminectomy L4-5 right-sided L5-S1 microdiscectomy Discharge Exam: Blood pressure (!) 146/93, pulse 78, temperature 97.7 F (36.5 C), temperature source Oral, resp. rate 20, height 5\' 11"  (1.803 m), weight 117.9 kg, SpO2 100 %. Strength 5/5 wound clean dry and intact  Disposition: Home      Signed: 07/29/2020 07/27/2020, 2:23 PM

## 2020-07-27 NOTE — Progress Notes (Signed)
Patient is discharged from room 3C06 at this time. Alert and in stable condition. IV site d/c'd and instructions read to patient and daughter with understanding verbalized and all questions answered. Left unit via wheelchair with all belongings at side.  

## 2020-07-27 NOTE — Anesthesia Procedure Notes (Signed)
Procedure Name: Intubation Date/Time: 07/27/2020 8:07 AM Performed by: Clearnce Sorrel, CRNA Pre-anesthesia Checklist: Patient identified, Emergency Drugs available, Suction available, Patient being monitored and Timeout performed Patient Re-evaluated:Patient Re-evaluated prior to induction Oxygen Delivery Method: Circle system utilized Preoxygenation: Pre-oxygenation with 100% oxygen Induction Type: IV induction Ventilation: Mask ventilation without difficulty and Oral airway inserted - appropriate to patient size Laryngoscope Size: Mac and 4 Grade View: Grade I Tube type: Oral Tube size: 7.5 mm Number of attempts: 1 Airway Equipment and Method: Stylet Placement Confirmation: ETT inserted through vocal cords under direct vision,  positive ETCO2,  CO2 detector and breath sounds checked- equal and bilateral Secured at: 24 cm Tube secured with: Tape Dental Injury: Teeth and Oropharynx as per pre-operative assessment

## 2020-07-27 NOTE — Op Note (Signed)
Preoperative diagnosis: Severe lumbar spinal stenosis L4-5 with bilateral L4-L5 radiculopathies  Herniated nucleus pulposus L5-S1 right with right-sided S1 radiculopathy  Postoperative diagnosis: Same  Procedure: #1 bilateral decompressive laminectomy L4-5 with partial medial facetectomies and foraminotomies of the L4 and L5 nerve roots  2.  Lumbar microdiscectomy L5-S1 with partial medial facetectomy and foraminotomy the right S1 nerve root microscopic discectomy  Surgeon: Jillyn Hidden Mescal Flinchbaugh  Assistant: Julien Girt  Anesthesia: General  EBL: Minimal  HPI: 55 year old gentleman progressive worsening back bilateral hip and leg pain rating down L4-L5 nerve root pattern as well as right S1 work-up revealed severe spinal stenosis at L4-5 bilaterally right-sided disc herniation L5-S1 due to patient failed conservative treatment imaging findings and progression of clinical syndrome I recommended bilateral decompressive laminectomy L4-5 and a right-sided L5-S1 microdiscectomy.  I extensively went over the risks and benefits of that operation with him as well as perioperative course expectations of outcome and alternatives of surgery and he understood and agreed to proceed forward.  Operative procedure: Patient was brought into the OR was due to general anesthesia positioned prone the Wilson frame his back was prepped and draped in routine sterile fashion.  A midline incision was drawn out and centered over the L4-5 and L5-S1 disc bases and after infiltration of 10 cc lidocaine with epi midline incision was made and Bovie letter cautery was used take down the subcutaneous tissue and subperiosteal dissection was carried out bilaterally at L4-5 and on the right side L5-S1.  Intraoperative x-ray confirmed identification appropriate level so a self-retaining retractor was placed first concentrating at L4-5 the spinous process at L4 was removed the inferior two thirds of the lamina of L4 and superior one third  lamina of L5 was then drilled down with a high-speed drill laminotomy was begun with a 3 and 4 mm Kerrison punch.  Ligament flavum is identified and marked notably markedly hypertrophied with severe hourglass compression of thecal sac.  I dissected the plane of the spur off of thecal sac with a 4 Penfield and under bit the medial facet joint with a 2 and 3 Miller Kerrison punch identified the L4 nerve root the L5 pedicle and the L5 nerve root unroofed both foramina decompressing the central canal as well as bilaterally at the L4 and L5 nerve roots.  This was then packed with Gelfoam tension taken L5-S1 at L5-S1 in similar fashion the inferior aspect of lamina L5 medial facet complex and superior aspect of the limb of S1 was drilled down with a high-speed drill.  Laminotomy was begun ligament was removed in piecemeal fashion the operating microscope was draped and brought into the field and then except lamination further under biting the meal guttural identification of the S1 pedicle and in the S1 disc base with a large disc radiation still partially contained with the ligament.  This space was incised several large fragments removed from the disc base decompressing central canal right S1 nerve root.  At the end discectomy there is no further stenosis no further fragments the S1 nerve root was easily mobilizable both places were packed with Gelfoam to Kassim states was maintained the wound was copiously irrigated and closed in layers with interrupted Vicryl in a running 4 subcuticular Dermabond benzoin Steri-Strips and a sterile dressing was applied patient recovery in stable condition.  At the end the case all needle counts and sponge counts were correct.

## 2020-07-27 NOTE — Anesthesia Postprocedure Evaluation (Signed)
Anesthesia Post Note  Patient: Dean Clements  Procedure(s) Performed: Laminectomy Lumbar four-Lumbar five - bilateral, Microdiscectomy - Lumbar five-Sacral one - right (Bilateral Back)     Patient location during evaluation: PACU Anesthesia Type: General Level of consciousness: awake and alert Pain management: pain level controlled Vital Signs Assessment: post-procedure vital signs reviewed and stable Respiratory status: spontaneous breathing, nonlabored ventilation, respiratory function stable and patient connected to nasal cannula oxygen Cardiovascular status: blood pressure returned to baseline and stable Postop Assessment: no apparent nausea or vomiting Anesthetic complications: no   No complications documented.  Last Vitals:  Vitals:   07/27/20 1115 07/27/20 1141  BP: 132/85 (!) 146/93  Pulse: 81 78  Resp: 12 20  Temp:  36.5 C  SpO2: 94% 100%    Last Pain:  Vitals:   07/27/20 1141  TempSrc: Oral  PainSc:                  Shelton Silvas

## 2020-07-28 ENCOUNTER — Encounter (HOSPITAL_COMMUNITY): Payer: Self-pay | Admitting: Neurosurgery

## 2020-07-30 MED FILL — Thrombin For Soln 5000 Unit: CUTANEOUS | Qty: 5000 | Status: AC

## 2020-08-14 ENCOUNTER — Ambulatory Visit: Payer: Medicaid Other | Admitting: Family Medicine

## 2020-08-15 ENCOUNTER — Ambulatory Visit: Payer: Medicaid Other | Admitting: Family Medicine

## 2020-08-16 ENCOUNTER — Ambulatory Visit: Payer: Medicaid Other | Admitting: Family Medicine

## 2020-08-17 ENCOUNTER — Ambulatory Visit: Payer: Medicaid Other | Admitting: Family Medicine

## 2020-09-11 ENCOUNTER — Other Ambulatory Visit: Payer: Self-pay | Admitting: Family Medicine

## 2020-09-11 DIAGNOSIS — E1169 Type 2 diabetes mellitus with other specified complication: Secondary | ICD-10-CM

## 2020-09-11 DIAGNOSIS — E1140 Type 2 diabetes mellitus with diabetic neuropathy, unspecified: Secondary | ICD-10-CM

## 2020-09-11 DIAGNOSIS — E785 Hyperlipidemia, unspecified: Secondary | ICD-10-CM

## 2020-09-27 ENCOUNTER — Other Ambulatory Visit: Payer: Self-pay | Admitting: Family Medicine

## 2020-09-27 ENCOUNTER — Other Ambulatory Visit: Payer: Self-pay

## 2020-09-27 DIAGNOSIS — M4726 Other spondylosis with radiculopathy, lumbar region: Secondary | ICD-10-CM

## 2020-09-27 DIAGNOSIS — G8929 Other chronic pain: Secondary | ICD-10-CM

## 2020-09-27 DIAGNOSIS — M5442 Lumbago with sciatica, left side: Secondary | ICD-10-CM

## 2020-09-27 MED ORDER — GABAPENTIN 600 MG PO TABS: 1200.0000 mg | ORAL_TABLET | Freq: Three times a day (TID) | ORAL | 0 refills | Status: DC

## 2020-09-27 NOTE — Telephone Encounter (Signed)
I called and explained to the patient that he had 4 no shows or same day cancellations in 1 week.    Patient is requesting a refill on Gabapentin and he will be finding a new provider.

## 2020-09-27 NOTE — Telephone Encounter (Signed)
Copied from CRM [id number]. Topic: General - Other >> Sep 25, 2020  1:41 PM Pawlus, Maxine Glenn A wrote: Reason for CRM: Pt stated he was not sure why he was discharged from the practice, pt stated he had back surgery and could not get out of bed for a long time. Pt stated he does not want to have to find a new PCP. >> Sep 26, 2020  3:55 PM Mcneil, Ja-Kwan wrote: Pt stated he has not heard back from anyone about being discharged from the practice. Pt stated he is need of medication refills and he really does not want to have to search for a new doctor. Pt requests call back regarding discharge letter.

## 2020-10-26 ENCOUNTER — Telehealth: Payer: Self-pay | Admitting: Family Medicine

## 2020-10-26 DIAGNOSIS — M4726 Other spondylosis with radiculopathy, lumbar region: Secondary | ICD-10-CM

## 2020-10-26 DIAGNOSIS — G8929 Other chronic pain: Secondary | ICD-10-CM

## 2020-10-26 NOTE — Telephone Encounter (Signed)
Medication Refill - Medication: Flexeril, gabapentin   Has the patient contacted their pharmacy? Yes.   Pt states that he has no refills on these medications at pharmacy. Pt states that he is completely out of medications. Please advise.  (Agent: If no, request that the patient contact the pharmacy for the refill.) (Agent: If yes, when and what did the pharmacy advise?)  Preferred Pharmacy (with phone number or street name):  [facility] - [place], [place] - [address]  [address] [place] [address]  Phone: [phone] Fax: [phone]  Hours: Not open 24 hours    Agent: Please be advised that RX refills may take up to 3 business days. We ask that you follow-up with your pharmacy.

## 2020-10-26 NOTE — Telephone Encounter (Signed)
Requested medication (s) are due for refill today: yes   Requested medication (s) are on the active medication list: yes  Last refill:  neurontin-  09/27/20 #180 0 refills , flexeril - 04/30/20 #90 2 refills   Future visit scheduled: no , letter sent out no longer with practice from practice.  Notes to clinic:  NT refilling Rx request unaware of previous letter from practice sent to no longer see patient. Contacted patient due to requested refills and need to get refills from another provider or UC or ED. Patient requesting to be seen at practice and reports he was having difficulty with transportation to get to previous appt. That is why he was no show.  Patient requesting to be reestablished with provider if willing to see him again. Please advise.    Requested Prescriptions  Pending Prescriptions Disp Refills   gabapentin (NEURONTIN) 600 MG tablet 180 tablet 0    Sig: Take 2 tablets (1,200 mg total) by mouth 3 (three) times daily. Max 6 pills per 24 hours      Neurology: Anticonvulsants - gabapentin Passed - 10/26/2020  1:03 PM      Passed - Valid encounter within last 12 months    Recent Outpatient Visits           7 months ago Type 2 diabetes mellitus with diabetic neuropathy, without long-term current use of insulin [facility])   [facility] Smitty Cords, DO   9 months ago Essential hypertension   [facility] Smitty Cords, DO   10 months ago Chronic bilateral low back pain with bilateral sciatica   [facility] Smitty Cords, DO   1 year ago Chronic bilateral low back pain with bilateral sciatica   [facility] Smitty Cords, DO   1 year ago Sore throat   [facility], Netta Neat, DO                  cyclobenzaprine (FLEXERIL) 10 MG tablet 90 tablet 2    Sig: Take 1 tablet (10 mg total) by mouth 3 (three) times daily as needed for muscle  spasms.      Not Delegated - Analgesics:  Muscle Relaxants Failed - 10/26/2020  1:03 PM      Failed - This refill cannot be delegated      Failed - Valid encounter within last 6 months    Recent Outpatient Visits           7 months ago Type 2 diabetes mellitus with diabetic neuropathy, without long-term current use of insulin [facility])   [facility] Smitty Cords, DO   9 months ago Essential hypertension   [facility] Smitty Cords, DO   10 months ago Chronic bilateral low back pain with bilateral sciatica   [facility] Smitty Cords, DO   1 year ago Chronic bilateral low back pain with bilateral sciatica   [facility] Smitty Cords, DO   1 year ago Sore throat   [facility] [place], Netta Neat, [place]

## 2020-10-27 ENCOUNTER — Other Ambulatory Visit: Payer: Self-pay | Admitting: Family Medicine

## 2020-10-27 DIAGNOSIS — M5442 Lumbago with sciatica, left side: Secondary | ICD-10-CM

## 2020-10-27 DIAGNOSIS — M4726 Other spondylosis with radiculopathy, lumbar region: Secondary | ICD-10-CM

## 2020-10-27 DIAGNOSIS — G8929 Other chronic pain: Secondary | ICD-10-CM

## 2020-10-30 ENCOUNTER — Other Ambulatory Visit: Payer: Self-pay | Admitting: Family Medicine

## 2020-10-30 DIAGNOSIS — G8929 Other chronic pain: Secondary | ICD-10-CM

## 2020-10-30 DIAGNOSIS — M4726 Other spondylosis with radiculopathy, lumbar region: Secondary | ICD-10-CM

## 2020-10-30 DIAGNOSIS — M5442 Lumbago with sciatica, left side: Secondary | ICD-10-CM

## 2020-10-31 ENCOUNTER — Other Ambulatory Visit: Payer: Self-pay | Admitting: Family Medicine

## 2020-10-31 DIAGNOSIS — M4726 Other spondylosis with radiculopathy, lumbar region: Secondary | ICD-10-CM

## 2020-10-31 DIAGNOSIS — M5442 Lumbago with sciatica, left side: Secondary | ICD-10-CM

## 2020-10-31 DIAGNOSIS — G8929 Other chronic pain: Secondary | ICD-10-CM

## 2020-10-31 MED ORDER — CYCLOBENZAPRINE HCL 10 MG PO TABS: 1.0000 | ORAL_TABLET | Freq: Three times a day (TID) | ORAL | 0 refills | Status: DC | PRN

## 2020-10-31 MED ORDER — GABAPENTIN 600 MG PO TABS: 1200.0000 mg | ORAL_TABLET | Freq: Three times a day (TID) | ORAL | 0 refills | Status: DC

## 2020-10-31 NOTE — Telephone Encounter (Signed)
Sent a 14 day supply for patient.  He had new patient appt with [facility]

## 2020-10-31 NOTE — Telephone Encounter (Addendum)
Pt wife is calling and does have new pt appt with dr Charlotta Newton at [facility]. Pt wife would like 14 day supply of gabapentin and flexeril. [facility] in [place]. Pt has not been able to go to work for last 2 days

## 2020-11-19 ENCOUNTER — Ambulatory Visit (INDEPENDENT_AMBULATORY_CARE_PROVIDER_SITE_OTHER): Payer: Medicaid Other | Admitting: Internal Medicine

## 2020-11-19 ENCOUNTER — Encounter: Payer: Self-pay | Admitting: Internal Medicine

## 2020-11-19 ENCOUNTER — Other Ambulatory Visit: Payer: Self-pay

## 2020-11-19 VITALS — BP 123/87 | HR 102 | Temp 99.0°F | Ht 70.28 in | Wt 243.4 lb

## 2020-11-19 DIAGNOSIS — E1140 Type 2 diabetes mellitus with diabetic neuropathy, unspecified: Secondary | ICD-10-CM

## 2020-11-19 DIAGNOSIS — M5441 Lumbago with sciatica, right side: Secondary | ICD-10-CM

## 2020-11-19 DIAGNOSIS — M109 Gout, unspecified: Secondary | ICD-10-CM

## 2020-11-19 DIAGNOSIS — F419 Anxiety disorder, unspecified: Secondary | ICD-10-CM | POA: Diagnosis not present

## 2020-11-19 DIAGNOSIS — I10 Essential (primary) hypertension: Secondary | ICD-10-CM | POA: Diagnosis not present

## 2020-11-19 DIAGNOSIS — M4726 Other spondylosis with radiculopathy, lumbar region: Secondary | ICD-10-CM

## 2020-11-19 DIAGNOSIS — G8929 Other chronic pain: Secondary | ICD-10-CM

## 2020-11-19 DIAGNOSIS — M5442 Lumbago with sciatica, left side: Secondary | ICD-10-CM | POA: Diagnosis not present

## 2020-11-19 LAB — BAYER DCA HB A1C WAIVED: HB A1C (BAYER DCA - WAIVED): 9.4 % — ABNORMAL HIGH (ref <7.0)

## 2020-11-19 MED ORDER — FLUOXETINE HCL 20 MG PO CAPS: 20.0000 mg | ORAL_CAPSULE | Freq: Every day | ORAL | 2 refills | Status: DC

## 2020-11-19 MED ORDER — CYCLOBENZAPRINE HCL 10 MG PO TABS
10.0000 mg | ORAL_TABLET | Freq: Three times a day (TID) | ORAL | 0 refills | Status: DC | PRN
Start: 2020-11-19 — End: 2021-01-03

## 2020-11-19 MED ORDER — GABAPENTIN 300 MG PO CAPS: 300.0000 mg | ORAL_CAPSULE | Freq: Two times a day (BID) | ORAL | 3 refills | Status: DC

## 2020-11-19 NOTE — Progress Notes (Signed)
There were no vitals taken for this visit.   Subjective:    Patient ID: Dean Clements, male    DOB: 13-Mar-1966, 55 y.o.   MRN: [id number]  Chief Complaint  Patient presents with   New Patient (Initial Visit)   Hypertension   Hyperlipidemia    HPI: Dean Clements is a 55 y.o. male  Pt is here with his daughter. Has a of DM. Had   Hypertension Associated symptoms include anxiety.  Hyperlipidemia  Back Pain This is a chronic (had spine surgery - back pain x  sees Dr. Lanice Schwab Devens) problem. The current episode started more than 1 year ago.  Anxiety Presents for initial (is on prozac) visit.     Chief Complaint  Patient presents with   New Patient (Initial Visit)   Hypertension   Hyperlipidemia    Relevant past medical, surgical, family and social history reviewed and updated as indicated. Interim medical history since our last visit reviewed. Allergies and medications reviewed and updated.  Review of Systems  Musculoskeletal:  Positive for back pain.   Per HPI unless specifically indicated above     Objective:    There were no vitals taken for this visit.  Wt Readings from Last 3 Encounters:  07/27/20 260 lb (117.9 kg)  07/25/20 (P) 264 lb 9.6 oz (120 kg)  03/26/20 245 lb (111.1 kg)    Physical Exam  Results for orders placed or performed during the hospital encounter of 07/27/20  Glucose, capillary  Result Value Ref Range   Glucose-Capillary 180 (H) 70 - 99 mg/dL  Glucose, capillary  Result Value Ref Range   Glucose-Capillary 207 (H) 70 - 99 mg/dL   Comment 1 Notify RN    Comment 2 Document in Chart   Glucose, capillary  Result Value Ref Range   Glucose-Capillary 253 (H) 70 - 99 mg/dL        Current Outpatient Medications:    cyclobenzaprine (FLEXERIL) 10 MG tablet, Take 1 tablet (10 mg total) by mouth 3 (three) times daily as needed for muscle spasms., Disp: 42 tablet, Rfl: 0   FLUoxetine (PROZAC) 20 MG capsule,  Take 1 capsule (20 mg total) by mouth daily., Disp: 30 capsule, Rfl: 2   gabapentin (NEURONTIN) 600 MG tablet, Take 2 tablets (1,200 mg total) by mouth 3 (three) times daily. Max 6 pills per 24 hours, Disp: 84 tablet, Rfl: 0   ketorolac (TORADOL) 60 MG/2ML SOLN injection, Inject 2 mLs (60 mg total) into the muscle once as needed for up to 1 dose for moderate pain or severe pain., Disp: 2 mL, Rfl: 1   losartan (COZAAR) 25 MG tablet, Take 1 tablet (25 mg total) by mouth daily., Disp: 90 tablet, Rfl: 3   metFORMIN (GLUCOPHAGE XR) 750 MG 24 hr tablet, Take 2 tablets (1,500 mg total) by mouth daily with breakfast., Disp: 180 tablet, Rfl: 1   oxyCODONE-acetaminophen (PERCOCET/ROXICET) 5-325 MG tablet, Take 1 tablet by mouth 4 (four) times daily as needed for moderate pain., Disp: , Rfl:    pantoprazole (PROTONIX) 40 MG tablet, TAKE 1 TABLET BY MOUTH ONCE DAILY (Patient taking differently: Take 40 mg by mouth daily.), Disp: 90 tablet, Rfl: 3   rosuvastatin (CRESTOR) 10 MG tablet, Take 1 tablet (10 mg total) by mouth at bedtime., Disp: 90 tablet, Rfl: 1    Assessment & Plan:  DM : Is on metformin a1c 7.8  check HbA1c,  urine  microalbumin  diabetic diet plan given to  pt  adviced regarding hypoglycemia and instructions given to pt today on how to prevent and treat the same if it were to occur. pt acknowledges the plan and voices understanding of the same.  exercise plan given and encouraged.   advice diabetic yearly podiatry, ophthalmology , nutritionist , dental check q 6 months,   Ref. Range 03/14/2020 10:26 07/25/2020 13:35  Hemoglobin A1C Latest Ref Range: 4.8 - 5.6 % 7.9 (H) 7.8 (H)   2, HLD : is on crestor. Increase to 40 mg daily.  recheck FLP, check LFT's work on diet, SE of meds explained to pt. low fat and high fiber diet explained to pt.  3. BACK pain : stable, since surgery   4. Gout :Big toe and small toe  not acutely inflammed  -check URIC acid levels today - Consider allopurinol uloric  for prevention of such   Problem List Items Addressed This Visit       Cardiovascular and Mediastinum   Essential hypertension     Endocrine   Type 2 diabetes mellitus with diabetic neuropathy (HCC)     Nervous and Auditory   Osteoarthritis of spine with radiculopathy, lumbar region   Relevant Medications   FLUoxetine (PROZAC) 20 MG capsule     Other   Anxiety   Relevant Medications   FLUoxetine (PROZAC) 20 MG capsule   Chronic bilateral back pain   Relevant Medications   FLUoxetine (PROZAC) 20 MG capsule     Follow up plan: No follow-ups on file.  On rechking pts A1c was 9.5 he will follow up with me x 2 days to d/w him mx of his diabetes.  Incrase dose of crestor to 40 mg daily

## 2020-11-20 ENCOUNTER — Telehealth: Payer: Self-pay | Admitting: *Deleted

## 2020-11-20 LAB — CBC WITH DIFFERENTIAL/PLATELET
Basophils Absolute: 0.1 10*3/uL (ref 0.0–0.2)
Basos: 2 %
EOS (ABSOLUTE): 0.9 10*3/uL — ABNORMAL HIGH (ref 0.0–0.4)
Eos: 12 %
Hematocrit: 46.9 % (ref 37.5–51.0)
Hemoglobin: 15.9 g/dL (ref 13.0–17.7)
Immature Grans (Abs): 0 10*3/uL (ref 0.0–0.1)
Immature Granulocytes: 0 %
Lymphocytes Absolute: 2 10*3/uL (ref 0.7–3.1)
Lymphs: 26 %
MCH: 29.6 pg (ref 26.6–33.0)
MCHC: 33.9 g/dL (ref 31.5–35.7)
MCV: 87 fL (ref 79–97)
Monocytes Absolute: 0.5 10*3/uL (ref 0.1–0.9)
Monocytes: 7 %
Neutrophils Absolute: 4.3 10*3/uL (ref 1.4–7.0)
Neutrophils: 53 %
Platelets: 206 10*3/uL (ref 150–450)
RBC: 5.38 x10E6/uL (ref 4.14–5.80)
RDW: 12.4 % (ref 11.6–15.4)
WBC: 7.9 10*3/uL (ref 3.4–10.8)

## 2020-11-20 LAB — LIPID PANEL
Chol/HDL Ratio: 5 ratio (ref 0.0–5.0)
Cholesterol, Total: 248 mg/dL — ABNORMAL HIGH (ref 100–199)
HDL: 50 mg/dL (ref >39)
LDL Chol Calc (NIH): 170 mg/dL — ABNORMAL HIGH (ref 0–99)
Triglycerides: 155 mg/dL — ABNORMAL HIGH (ref 0–149)
VLDL Cholesterol Cal: 28 mg/dL (ref 5–40)

## 2020-11-20 LAB — HEPATIC FUNCTION PANEL
ALT: 34 IU/L (ref 0–44)
AST: 26 IU/L (ref 0–40)
Albumin: 4.4 g/dL (ref 3.8–4.9)
Alkaline Phosphatase: 115 IU/L (ref 44–121)
Bilirubin Total: 0.7 mg/dL (ref 0.0–1.2)
Bilirubin, Direct: 0.2 mg/dL (ref 0.00–0.40)
Total Protein: 7.3 g/dL (ref 6.0–8.5)

## 2020-11-20 LAB — BASIC METABOLIC PANEL
BUN/Creatinine Ratio: 11 (ref 9–20)
BUN: 10 mg/dL (ref 6–24)
CO2: 20 mmol/L (ref 20–29)
Calcium: 9.4 mg/dL (ref 8.7–10.2)
Chloride: 99 mmol/L (ref 96–106)
Creatinine, Ser: 0.95 mg/dL (ref 0.76–1.27)
Glucose: 297 mg/dL — ABNORMAL HIGH (ref 65–99)
Potassium: 3.7 mmol/L (ref 3.5–5.2)
Sodium: 136 mmol/L (ref 134–144)
eGFR: 95 mL/min/{1.73_m2} (ref >59)

## 2020-11-20 LAB — URIC ACID: Uric Acid: 6.6 mg/dL (ref 3.8–8.4)

## 2020-11-20 LAB — PSA: Prostate Specific Ag, Serum: 1.6 ng/mL (ref 0.0–4.0)

## 2020-11-20 LAB — TSH: TSH: 1.16 u[IU]/mL (ref 0.450–4.500)

## 2020-11-20 NOTE — Telephone Encounter (Signed)
Pt was called to schedule an appt for this week per Dr. Charlotta Newton for High A1C. LVM for pt. To call back and schedule

## 2020-11-21 ENCOUNTER — Other Ambulatory Visit: Payer: Self-pay

## 2020-11-21 ENCOUNTER — Encounter: Payer: Self-pay | Admitting: Internal Medicine

## 2020-11-21 ENCOUNTER — Ambulatory Visit (INDEPENDENT_AMBULATORY_CARE_PROVIDER_SITE_OTHER): Payer: Medicaid Other | Admitting: Internal Medicine

## 2020-11-21 VITALS — BP 130/83 | HR 101 | Temp 98.8°F | Ht 70.28 in | Wt 243.0 lb

## 2020-11-21 DIAGNOSIS — I10 Essential (primary) hypertension: Secondary | ICD-10-CM | POA: Diagnosis not present

## 2020-11-21 MED ORDER — TRULICITY 0.75 MG/0.5ML ~~LOC~~ SOAJ: 0.7500 mg | SUBCUTANEOUS | 4 refills | Status: DC

## 2020-11-21 MED ORDER — ROSUVASTATIN CALCIUM 40 MG PO TABS: 40.0000 mg | ORAL_TABLET | Freq: Every day | ORAL | 3 refills | Status: DC

## 2020-11-21 NOTE — Progress Notes (Signed)
BP 130/83   Pulse (!) 101   Temp 98.8 F (37.1 C) (Oral)   Ht 5' 10.28" (1.785 m)   Wt 243 lb (110.2 kg)   SpO2 95%   BMI 34.59 kg/m    Subjective:    Patient ID: Dean Clements, male    DOB: 1965-07-25, 55 y.o.   MRN: [id number]  Chief Complaint  Patient presents with  . Diabetes  . Hyperlipidemia    HPI: Dean Clements is a 55 y.o. male  Diabetes He presents for his follow-up diabetic visit. He has type 2 diabetes mellitus. Pertinent negatives for diabetes include no blurred vision, no chest pain, no fatigue, no foot paresthesias, no foot ulcerations, no polydipsia, no polyphagia, no polyuria, no visual change and no weakness.  Hyperlipidemia This is a chronic problem. The current episode started more than 1 year ago. The problem is controlled. Pertinent negatives include no chest pain.    Chief Complaint  Patient presents with  . Diabetes  . Hyperlipidemia    Relevant past medical, surgical, family and social history reviewed and updated as indicated. Interim medical history since our last visit reviewed. Allergies and medications reviewed and updated.  Review of Systems  Constitutional:  Negative for fatigue.  Eyes:  Negative for blurred vision.  Cardiovascular:  Negative for chest pain.  Endocrine: Negative for polydipsia, polyphagia and polyuria.  Neurological:  Negative for weakness.   Per HPI unless specifically indicated above     Objective:    BP 130/83   Pulse (!) 101   Temp 98.8 F (37.1 C) (Oral)   Ht 5' 10.28" (1.785 m)   Wt 243 lb (110.2 kg)   SpO2 95%   BMI 34.59 kg/m   Wt Readings from Last 3 Encounters:  11/21/20 243 lb (110.2 kg)  11/19/20 243 lb 6.4 oz (110.4 kg)  07/27/20 260 lb (117.9 kg)    Physical Exam  Results for orders placed or performed in visit on 11/19/20  Bayer DCA Hb A1c Waived  Result Value Ref Range   HB A1C (BAYER DCA - WAIVED) 9.4 (H) <2.0 %  Basic metabolic panel  Result Value Ref Range    Glucose 297 (H) 65 - 99 mg/dL   BUN 10 6 - 24 mg/dL   Creatinine, Ser 0.95 0.76 - 1.27 mg/dL   eGFR 95 >59 mL/min/1.73   BUN/Creatinine Ratio 11 9 - 20   Sodium 136 134 - 144 mmol/L   Potassium 3.7 3.5 - 5.2 mmol/L   Chloride 99 96 - 106 mmol/L   CO2 20 20 - 29 mmol/L   Calcium 9.4 8.7 - 10.2 mg/dL  TSH  Result Value Ref Range   TSH 1.160 0.450 - 4.500 uIU/mL  PSA  Result Value Ref Range   Prostate Specific Ag, Serum 1.6 0.0 - 4.0 ng/mL  Lipid panel  Result Value Ref Range   Cholesterol, Total 248 (H) 100 - 199 mg/dL   Triglycerides 155 (H) 0 - 149 mg/dL   HDL 50 >39 mg/dL   VLDL Cholesterol Cal 28 5 - 40 mg/dL   LDL Chol Calc (NIH) 170 (H) 0 - 99 mg/dL   Chol/HDL Ratio 5.0 0.0 - 5.0 ratio  CBC with Differential/Platelet  Result Value Ref Range   WBC 7.9 3.4 - 10.8 x10E3/uL   RBC 5.38 4.14 - 5.80 x10E6/uL   Hemoglobin 15.9 13.0 - 17.7 g/dL   Hematocrit 46.9 37.5 - 51.0 %   MCV 87 79 - 97 fL  MCH 29.6 26.6 - 33.0 pg   MCHC 33.9 31.5 - 35.7 g/dL   RDW 12.4 11.6 - 15.4 %   Platelets 206 150 - 450 x10E3/uL   Neutrophils 53 Not Estab. %   Lymphs 26 Not Estab. %   Monocytes 7 Not Estab. %   Eos 12 Not Estab. %   Basos 2 Not Estab. %   Neutrophils Absolute 4.3 1.4 - 7.0 x10E3/uL   Lymphocytes Absolute 2.0 0.7 - 3.1 x10E3/uL   Monocytes Absolute 0.5 0.1 - 0.9 x10E3/uL   EOS (ABSOLUTE) 0.9 (H) 0.0 - 0.4 x10E3/uL   Basophils Absolute 0.1 0.0 - 0.2 x10E3/uL   Immature Granulocytes 0 Not Estab. %   Immature Grans (Abs) 0.0 0.0 - 0.1 x10E3/uL  Hepatic function panel  Result Value Ref Range   Total Protein 7.3 6.0 - 8.5 g/dL   Albumin 4.4 3.8 - 4.9 g/dL   Bilirubin Total 0.7 0.0 - 1.2 mg/dL   Bilirubin, Direct 0.20 0.00 - 0.40 mg/dL   Alkaline Phosphatase 115 44 - 121 IU/L   AST 26 0 - 40 IU/L   ALT 34 0 - 44 IU/L  Uric acid  Result Value Ref Range   Uric Acid 6.6 3.8 - 8.4 mg/dL        Current Outpatient Medications:  .  cyclobenzaprine (FLEXERIL) 10 MG tablet,  Take 1 tablet (10 mg total) by mouth 3 (three) times daily as needed for muscle spasms., Disp: 30 tablet, Rfl: 0 .  FLUoxetine (PROZAC) 20 MG capsule, Take 1 capsule (20 mg total) by mouth daily., Disp: 30 capsule, Rfl: 2 .  gabapentin (NEURONTIN) 300 MG capsule, Take 1 capsule (300 mg total) by mouth 2 (two) times daily., Disp: 60 capsule, Rfl: 3 .  losartan (COZAAR) 25 MG tablet, Take 1 tablet (25 mg total) by mouth daily., Disp: 90 tablet, Rfl: 3 .  metFORMIN (GLUCOPHAGE XR) 750 MG 24 hr tablet, Take 2 tablets (1,500 mg total) by mouth daily with breakfast., Disp: 180 tablet, Rfl: 1 .  pantoprazole (PROTONIX) 40 MG tablet, TAKE 1 TABLET BY MOUTH ONCE DAILY (Patient taking differently: Take 40 mg by mouth daily.), Disp: 90 tablet, Rfl: 3 .  rosuvastatin (CRESTOR) 40 MG tablet, Take 1 tablet (40 mg total) by mouth daily., Disp: 90 tablet, Rfl: 3    Assessment & Plan:  DM: Start pt on trulicity 2.87 check OMV6H,  urine  microalbumin  diabetic diet plan given to pt  adviced regarding hypoglycemia and instructions given to pt today on how to prevent and treat the same if it were to occur. pt acknowledges the plan and voices understanding of the same.  exercise plan given and encouraged.   advice diabetic yearly podiatry, ophthalmology , nutritionist , dental check q 6 months  HLD increase crestor to 40  recheck FLP, check LFT's work on diet, SE of meds explained to pt. low fat and high fiber diet explained to pt.    Problem List Items Addressed This Visit   None    No orders of the defined types were placed in this encounter.    No orders of the defined types were placed in this encounter.    Follow up plan: No follow-ups on file.

## 2020-11-29 DIAGNOSIS — M5416 Radiculopathy, lumbar region: Secondary | ICD-10-CM | POA: Diagnosis not present

## 2020-12-03 ENCOUNTER — Telehealth: Payer: Self-pay

## 2020-12-03 NOTE — Telephone Encounter (Signed)
Copied from CRM [id number]. Topic: General - Other >> Dec 03, 2020  4:25 PM Mcneil, Ja-Kwan wrote: Reason for CRM: Pt stated he just started back working and his prescriptions for gabapentin (NEURONTIN) 300 MG capsule and cyclobenzaprine (FLEXERIL) 10 MG tablet were decreased from what was normally prescribed. Pt requests that the Rx for both medications be increased by dosage or the amount of medication so he can work full time.

## 2020-12-04 ENCOUNTER — Ambulatory Visit: Payer: Medicaid Other | Admitting: Internal Medicine

## 2020-12-06 ENCOUNTER — Other Ambulatory Visit: Payer: Self-pay | Admitting: Family Medicine

## 2020-12-06 ENCOUNTER — Other Ambulatory Visit: Payer: Self-pay | Admitting: Internal Medicine

## 2020-12-06 DIAGNOSIS — E1140 Type 2 diabetes mellitus with diabetic neuropathy, unspecified: Secondary | ICD-10-CM

## 2020-12-06 MED ORDER — METFORMIN HCL ER 750 MG PO TB24: 1500.0000 mg | ORAL_TABLET | Freq: Every day | ORAL | 0 refills | Status: DC

## 2020-12-06 NOTE — Telephone Encounter (Signed)
Pt is calling to check on the status of his medication - pt is taking gabapentin 300 mg  (2 times a day) pt was previously taken gabpentin 600 mg (3 times a day) Pt has returned back to work and stating that he 300mg  (2 times a day) was not working and his was taking more medication.  Pt is taking more than the flexeril (10mg  2 times a day)  Pt states that he has returned back to work full time and his body is in pain after he had back surgery  Please advise [phone] Preferred Pharmacy - [facility]

## 2020-12-06 NOTE — Telephone Encounter (Signed)
Medication: metFORMIN (GLUCOPHAGE XR) 750 MG 24 hr tablet [[id number]]   Has the patient contacted their pharmacy? YES  (Agent: If no, request that the patient contact the pharmacy for the refill.) (Agent: If yes, when and what did the pharmacy advise?)  Preferred Pharmacy (with phone number or street name): [facility] [place], [place] - [address] [place] [address] Phone: [phone] Fax: [phone] Hours: Not open 24 hours    Agent: Please be advised that RX refills may take up to 3 business days. We ask that you follow-up with your pharmacy.

## 2020-12-06 NOTE — Telephone Encounter (Signed)
Future visit in 3 weeks °

## 2020-12-07 NOTE — Telephone Encounter (Signed)
Pt is scheduled 7/27 

## 2020-12-18 ENCOUNTER — Other Ambulatory Visit: Payer: Self-pay | Admitting: Family Medicine

## 2020-12-18 DIAGNOSIS — G8929 Other chronic pain: Secondary | ICD-10-CM

## 2020-12-18 DIAGNOSIS — M4726 Other spondylosis with radiculopathy, lumbar region: Secondary | ICD-10-CM

## 2020-12-26 ENCOUNTER — Other Ambulatory Visit: Payer: Medicaid Other

## 2020-12-27 ENCOUNTER — Other Ambulatory Visit: Payer: Self-pay | Admitting: Family Medicine

## 2020-12-27 DIAGNOSIS — M4726 Other spondylosis with radiculopathy, lumbar region: Secondary | ICD-10-CM

## 2020-12-27 DIAGNOSIS — G8929 Other chronic pain: Secondary | ICD-10-CM

## 2021-01-02 ENCOUNTER — Ambulatory Visit: Payer: Medicaid Other | Admitting: Internal Medicine

## 2021-01-03 ENCOUNTER — Other Ambulatory Visit: Payer: Self-pay

## 2021-01-03 ENCOUNTER — Ambulatory Visit (INDEPENDENT_AMBULATORY_CARE_PROVIDER_SITE_OTHER): Payer: Medicaid Other | Admitting: Internal Medicine

## 2021-01-03 ENCOUNTER — Encounter: Payer: Self-pay | Admitting: Internal Medicine

## 2021-01-03 DIAGNOSIS — M5441 Lumbago with sciatica, right side: Secondary | ICD-10-CM | POA: Diagnosis not present

## 2021-01-03 DIAGNOSIS — M5442 Lumbago with sciatica, left side: Secondary | ICD-10-CM

## 2021-01-03 DIAGNOSIS — M4726 Other spondylosis with radiculopathy, lumbar region: Secondary | ICD-10-CM

## 2021-01-03 DIAGNOSIS — G8929 Other chronic pain: Secondary | ICD-10-CM | POA: Diagnosis not present

## 2021-01-03 MED ORDER — GABAPENTIN 300 MG PO CAPS: 300.0000 mg | ORAL_CAPSULE | Freq: Two times a day (BID) | ORAL | 3 refills | Status: DC

## 2021-01-03 MED ORDER — LIDOCAINE 5 % EX PTCH: 1.0000 | MEDICATED_PATCH | CUTANEOUS | 0 refills | Status: DC

## 2021-01-03 MED ORDER — CYCLOBENZAPRINE HCL 10 MG PO TABS: 10.0000 mg | ORAL_TABLET | Freq: Every day | ORAL | 1 refills | Status: DC

## 2021-01-03 NOTE — Progress Notes (Signed)
BP 125/82   Pulse 96   Temp 98.5 F (36.9 C) (Oral)   Ht 5' 10.28" (1.785 m)   Wt 243 lb (110.2 kg)   SpO2 96%   BMI 34.59 kg/m    Subjective:    Patient ID: Dean Clements, male    DOB: [birth year], 55 y.o.   MRN: [id number]  Chief Complaint  Patient presents with   Hypertension   Gout   Back Pain    Patient states that he is in so much pain that he can not work and wants to start disability He would like to have his gabapentin refill and a refill of his flexeril. He would like to have the gabapentin refilled at the does he was using prior to being reduced. Marland Kitchen    HPI: Dean Clements is a 55 y.o. 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 Says he can barely do anything - saw neurosurgery in 2 months ago. Dr. Beatris Ship neurosurgery [facility]  Back Pain This is a recurrent problem. The problem occurs intermittently. The quality of the pain is described as shooting. The pain is at a severity of 6/10. Associated symptoms include paresis, paresthesias and tingling. Pertinent negatives include no abdominal pain, bladder incontinence, bowel incontinence, chest pain, dysuria, fever, headaches, leg pain, numbness, pelvic pain, perianal numbness, weakness or weight loss. The treatment provided moderate relief.   Chief Complaint  Patient presents with   Hypertension   Gout   Back Pain    Patient states that he is in so much pain that he can not work and wants to start disability He would like to have his gabapentin refill and a refill of his flexeril. He would like to have the gabapentin refilled at the does he was using prior to being reduced. .    Relevant past medical, surgical, family and social history reviewed and updated as indicated. Interim medical history since our last visit reviewed. Allergies and medications reviewed and updated.  Review of Systems  Constitutional:   Negative for fever and weight loss.  Cardiovascular:  Negative for chest pain.  Gastrointestinal:  Negative for abdominal pain and bowel incontinence.  Genitourinary:  Negative for bladder incontinence, dysuria and pelvic pain.  Musculoskeletal:  Positive for back pain.  Neurological:  Positive for tingling and paresthesias. Negative for weakness, numbness and headaches.   Per HPI unless specifically indicated above     Objective:    BP 125/82   Pulse 96   Temp 98.5 F (36.9 C) (Oral)   Ht 5' 10.28" (1.785 m)   Wt 243 lb (110.2 kg)   SpO2 96%   BMI 34.59 kg/m   Wt Readings from Last 3 Encounters:  01/03/21 243 lb (110.2 kg)  11/21/20 243 lb (110.2 kg)  11/19/20 243 lb 6.4 oz (110.4 kg)    Physical Exam Vitals and nursing note reviewed.  Constitutional:      General: He is not in acute distress.    Appearance: Normal appearance. He is not ill-appearing or diaphoretic.  HENT:     Head: Normocephalic and atraumatic.     Right Ear: Tympanic membrane and external ear normal. There is no impacted cerumen.     Left Ear: External ear normal.     Nose: No congestion or rhinorrhea.     Mouth/Throat:     Pharynx: No oropharyngeal exudate or posterior  oropharyngeal erythema.  Eyes:     Conjunctiva/sclera: Conjunctivae normal.     Pupils: Pupils are equal, round, and reactive to light.  Abdominal:     Palpations: There is no mass.     Tenderness: There is no abdominal tenderness.  Musculoskeletal:     Cervical back: No rigidity or tenderness.     Left lower leg: No edema.  Neurological:     Mental Status: He is alert.    Results for orders placed or performed in visit on 11/19/20  Bayer DCA Hb A1c Waived  Result Value Ref Range   HB A1C (BAYER DCA - WAIVED) 9.4 (H) <5.9 %  Basic metabolic panel  Result Value Ref Range   Glucose 297 (H) 65 - 99 mg/dL   BUN 10 6 - 24 mg/dL   Creatinine, Ser 0.95 0.76 - 1.27 mg/dL   eGFR 95 >59 mL/min/1.73   BUN/Creatinine Ratio 11 9 -  20   Sodium 136 134 - 144 mmol/L   Potassium 3.7 3.5 - 5.2 mmol/L   Chloride 99 96 - 106 mmol/L   CO2 20 20 - 29 mmol/L   Calcium 9.4 8.7 - 10.2 mg/dL  TSH  Result Value Ref Range   TSH 1.160 0.450 - 4.500 uIU/mL  PSA  Result Value Ref Range   Prostate Specific Ag, Serum 1.6 0.0 - 4.0 ng/mL  Lipid panel  Result Value Ref Range   Cholesterol, Total 248 (H) 100 - 199 mg/dL   Triglycerides 155 (H) 0 - 149 mg/dL   HDL 50 >39 mg/dL   VLDL Cholesterol Cal 28 5 - 40 mg/dL   LDL Chol Calc (NIH) 170 (H) 0 - 99 mg/dL   Chol/HDL Ratio 5.0 0.0 - 5.0 ratio  CBC with Differential/Platelet  Result Value Ref Range   WBC 7.9 3.4 - 10.8 x10E3/uL   RBC 5.38 4.14 - 5.80 x10E6/uL   Hemoglobin 15.9 13.0 - 17.7 g/dL   Hematocrit 46.9 37.5 - 51.0 %   MCV 87 79 - 97 fL   MCH 29.6 26.6 - 33.0 pg   MCHC 33.9 31.5 - 35.7 g/dL   RDW 12.4 11.6 - 15.4 %   Platelets 206 150 - 450 x10E3/uL   Neutrophils 53 Not Estab. %   Lymphs 26 Not Estab. %   Monocytes 7 Not Estab. %   Eos 12 Not Estab. %   Basos 2 Not Estab. %   Neutrophils Absolute 4.3 1.4 - 7.0 x10E3/uL   Lymphocytes Absolute 2.0 0.7 - 3.1 x10E3/uL   Monocytes Absolute 0.5 0.1 - 0.9 x10E3/uL   EOS (ABSOLUTE) 0.9 (H) 0.0 - 0.4 x10E3/uL   Basophils Absolute 0.1 0.0 - 0.2 x10E3/uL   Immature Granulocytes 0 Not Estab. %   Immature Grans (Abs) 0.0 0.0 - 0.1 x10E3/uL  Hepatic function panel  Result Value Ref Range   Total Protein 7.3 6.0 - 8.5 g/dL   Albumin 4.4 3.8 - 4.9 g/dL   Bilirubin Total 0.7 0.0 - 1.2 mg/dL   Bilirubin, Direct 0.20 0.00 - 0.40 mg/dL   Alkaline Phosphatase 115 44 - 121 IU/L   AST 26 0 - 40 IU/L   ALT 34 0 - 44 IU/L  Uric acid  Result Value Ref Range   Uric Acid 6.6 3.8 - 8.4 mg/dL        Current Outpatient Medications:    Dulaglutide (TRULICITY) 5.63 OV/5.6EP SOPN, Inject 0.75 mg into the skin once a week., Disp: 0.5 mL, Rfl: 4  FLUoxetine (PROZAC) 20 MG capsule, Take 1 capsule (20 mg total) by mouth daily., Disp:  30 capsule, Rfl: 2   lidocaine (LIDODERM) 5 %, Place 1 patch onto the skin daily. Remove & Discard patch within 12 hours or as directed by MD, Disp: 30 patch, Rfl: 0   losartan (COZAAR) 25 MG tablet, Take 1 tablet (25 mg total) by mouth daily., Disp: 90 tablet, Rfl: 3   metFORMIN (GLUCOPHAGE XR) 750 MG 24 hr tablet, Take 2 tablets (1,500 mg total) by mouth daily with breakfast., Disp: 180 tablet, Rfl: 0   pantoprazole (PROTONIX) 40 MG tablet, TAKE 1 TABLET BY MOUTH ONCE DAILY (Patient taking differently: Take 40 mg by mouth daily.), Disp: 90 tablet, Rfl: 3   rosuvastatin (CRESTOR) 40 MG tablet, Take 1 tablet (40 mg total) by mouth daily., Disp: 90 tablet, Rfl: 3   Accu-Chek Softclix Lancets lancets, SMARTSIG:Topical, Disp: , Rfl:    cyclobenzaprine (FLEXERIL) 10 MG tablet, Take 1 tablet (10 mg total) by mouth at bedtime., Disp: 30 tablet, Rfl: 1   gabapentin (NEURONTIN) 300 MG capsule, Take 1 capsule (300 mg total) by mouth 2 (two) times daily., Disp: 60 capsule, Rfl: 3    Assessment & Plan:  Back pain :  Will need to fu with neurosurgery  Has pain going down  Will refill gabapentin and flexeril as courtesy   Problem List Items Addressed This Visit       Nervous and Auditory   Osteoarthritis of spine with radiculopathy, lumbar region   Relevant Medications   gabapentin (NEURONTIN) 300 MG capsule   cyclobenzaprine (FLEXERIL) 10 MG tablet     Other   Chronic bilateral back pain   Relevant Medications   gabapentin (NEURONTIN) 300 MG capsule   cyclobenzaprine (FLEXERIL) 10 MG tablet   Other Relevant Orders   Ambulatory referral to Orthopedics     Orders Placed This Encounter  Procedures   Ambulatory referral to Orthopedics     Meds ordered this encounter  Medications   gabapentin (NEURONTIN) 300 MG capsule    Sig: Take 1 capsule (300 mg total) by mouth 2 (two) times daily.    Dispense:  60 capsule    Refill:  3   cyclobenzaprine (FLEXERIL) 10 MG tablet    Sig: Take 1 tablet  (10 mg total) by mouth at bedtime.    Dispense:  30 tablet    Refill:  1   lidocaine (LIDODERM) 5 %    Sig: Place 1 patch onto the skin daily. Remove & Discard patch within 12 hours or as directed by MD    Dispense:  30 patch    Refill:  0     Follow up plan: Return in about 6 weeks (around 02/14/2021).

## 2021-01-07 ENCOUNTER — Telehealth: Payer: Self-pay

## 2021-01-07 NOTE — Telephone Encounter (Signed)
PA started for Lidocaine 5% patches through cover my meds, awaiting on determination.

## 2021-02-07 ENCOUNTER — Other Ambulatory Visit: Payer: Medicaid Other

## 2021-02-14 ENCOUNTER — Ambulatory Visit: Payer: Medicaid Other | Admitting: Internal Medicine

## 2021-03-14 ENCOUNTER — Other Ambulatory Visit: Payer: Self-pay | Admitting: Internal Medicine

## 2021-03-14 DIAGNOSIS — G8929 Other chronic pain: Secondary | ICD-10-CM

## 2021-03-14 DIAGNOSIS — M4726 Other spondylosis with radiculopathy, lumbar region: Secondary | ICD-10-CM

## 2021-03-14 DIAGNOSIS — M5441 Lumbago with sciatica, right side: Secondary | ICD-10-CM

## 2021-03-14 NOTE — Telephone Encounter (Signed)
Requested medication (s) are due for refill today: yes  Requested medication (s) are on the active medication list: yes   Last refill: 01/03/21  #30  1 refill  Future visit scheduled no  Notes to clinic: not delegated  Requested Prescriptions  Pending Prescriptions Disp Refills   cyclobenzaprine (FLEXERIL) 10 MG tablet [Pharmacy Med Name: CYCLOBENZAPRINE HCL 10 MG TAB] 30 tablet 1    Sig: TAKE 1 TABLET BY MOUTH AT BEDTIME     Not Delegated - Analgesics:  Muscle Relaxants Failed - 03/14/2021  9:43 AM      Failed - This refill cannot be delegated      Passed - Valid encounter within last 6 months    Recent Outpatient Visits           2 months ago Chronic bilateral low back pain with bilateral sciatica   [facility] Vigg, Avanti, MD   3 months ago Essential hypertension   [facility] Vigg, Avanti, MD   3 months ago Gout, unspecified cause, unspecified chronicity, unspecified site   [facility] Vigg, Avanti, MD   11 months ago Type 2 diabetes mellitus with diabetic neuropathy, without long-term current use of insulin (HCC)   [facility] [place], Netta Neat, DO   1 year ago Essential hypertension   [facility] [place], Netta Neat, DO

## 2021-03-15 ENCOUNTER — Other Ambulatory Visit: Payer: Self-pay | Admitting: Internal Medicine

## 2021-03-15 DIAGNOSIS — K219 Gastro-esophageal reflux disease without esophagitis: Secondary | ICD-10-CM

## 2021-03-15 NOTE — Telephone Encounter (Signed)
  Requested medications are on the active medication list yes  Last visit has not had this med/dx addressed in a year  Future visit scheduled NO SHOW 02/2021  Notes to clinic No upcoming appt, please assess.

## 2021-03-15 NOTE — Telephone Encounter (Signed)
Medication Refill - Medication: Pantoprazole 40 mg  Has the patient contacted their pharmacy? Yes.  Ilda Basset said waiting on provider Preferred Pharmacy (with phone number or street name): [facility] [place] phone number [phone] Has the patient been seen for an appointment in the last year OR does the patient have an upcoming appointment? Yes.    Agent: Please be advised that RX refills may take up to 3 business days. We ask that you follow-up with your pharmacy.

## 2021-03-19 ENCOUNTER — Other Ambulatory Visit: Payer: Self-pay

## 2021-03-20 ENCOUNTER — Ambulatory Visit: Payer: Medicaid Other | Admitting: Internal Medicine

## 2021-03-20 MED ORDER — PANTOPRAZOLE SODIUM 40 MG PO TBEC: 40.0000 mg | DELAYED_RELEASE_TABLET | Freq: Every day | ORAL | 3 refills | Status: DC

## 2021-03-20 NOTE — Telephone Encounter (Signed)
Pt scheduled for today, he is also experiencing some recent dizziness.

## 2021-03-20 NOTE — Telephone Encounter (Signed)
thnx

## 2021-03-21 ENCOUNTER — Ambulatory Visit: Payer: Medicaid Other | Admitting: Internal Medicine

## 2021-03-22 ENCOUNTER — Ambulatory Visit: Payer: Medicaid Other | Admitting: Internal Medicine

## 2021-04-06 ENCOUNTER — Other Ambulatory Visit: Payer: Self-pay | Admitting: Internal Medicine

## 2021-04-06 ENCOUNTER — Other Ambulatory Visit: Payer: Self-pay | Admitting: Family Medicine

## 2021-04-06 DIAGNOSIS — I10 Essential (primary) hypertension: Secondary | ICD-10-CM

## 2021-04-06 DIAGNOSIS — E1140 Type 2 diabetes mellitus with diabetic neuropathy, unspecified: Secondary | ICD-10-CM

## 2021-04-06 DIAGNOSIS — M4726 Other spondylosis with radiculopathy, lumbar region: Secondary | ICD-10-CM

## 2021-04-06 DIAGNOSIS — M5441 Lumbago with sciatica, right side: Secondary | ICD-10-CM

## 2021-04-06 DIAGNOSIS — G8929 Other chronic pain: Secondary | ICD-10-CM

## 2021-04-06 NOTE — Telephone Encounter (Signed)
Requested medication (s) are due for refill today: yes  Requested medication (s) are on the active medication list: yes  Last refill:  03/14/21 #30  Future visit scheduled: no  Notes to clinic:  med not delegated to NT to RF   Requested Prescriptions  Pending Prescriptions Disp Refills   cyclobenzaprine (FLEXERIL) 10 MG tablet [Pharmacy Med Name: CYCLOBENZAPRINE HCL 10 MG TAB] 30 tablet 0    Sig: TAKE 1 TABLET BY MOUTH AT BEDTIME     Not Delegated - Analgesics:  Muscle Relaxants Failed - 04/06/2021  1:56 PM      Failed - This refill cannot be delegated      Passed - Valid encounter within last 6 months    Recent Outpatient Visits           3 months ago Chronic bilateral low back pain with bilateral sciatica   [facility] Vigg, Avanti, MD   4 months ago Essential hypertension   [facility] Vigg, Avanti, MD   4 months ago Gout, unspecified cause, unspecified chronicity, unspecified site   [facility] Vigg, Avanti, MD   1 year ago Type 2 diabetes mellitus with diabetic neuropathy, without long-term current use of insulin (HCC)   [facility] [place], Netta Neat, DO   1 year ago Essential hypertension   [facility] [place], Netta Neat, DO

## 2021-04-11 ENCOUNTER — Other Ambulatory Visit: Payer: Self-pay

## 2021-04-11 DIAGNOSIS — E1140 Type 2 diabetes mellitus with diabetic neuropathy, unspecified: Secondary | ICD-10-CM

## 2021-04-11 DIAGNOSIS — I10 Essential (primary) hypertension: Secondary | ICD-10-CM

## 2021-04-12 MED ORDER — LOSARTAN POTASSIUM 25 MG PO TABS: 25.0000 mg | ORAL_TABLET | Freq: Every day | ORAL | 3 refills | Status: DC

## 2021-04-25 ENCOUNTER — Other Ambulatory Visit: Payer: Self-pay | Admitting: Internal Medicine

## 2021-04-25 DIAGNOSIS — E1140 Type 2 diabetes mellitus with diabetic neuropathy, unspecified: Secondary | ICD-10-CM

## 2021-04-26 NOTE — Telephone Encounter (Signed)
Requested medcations are due for refill today requesting early  Requested medications are on the active medication list yes  Last refill 03/14/21 (90 day supply)  Last visit 11/21/20  Future visit scheduled NO, NO SHOW several times.  Notes to clinic requesting early, multiple no shows, please assess. Requested Prescriptions  Pending Prescriptions Disp Refills   metFORMIN (GLUCOPHAGE-XR) 750 MG 24 hr tablet [Pharmacy Med Name: METFORMIN HCL ER 750 MG TAB] 180 tablet 0    Sig: TAKE 2 TABLETS BY MOUTH ONCE DAILY WITH BREAKFAST     Endocrinology:  Diabetes - Biguanides Failed - 04/25/2021  5:24 PM      Failed - HBA1C is between 0 and 7.9 and within 180 days    HB A1C (BAYER DCA - WAIVED)  Date Value Ref Range Status  11/19/2020 9.4 (H) <7.0 % Final    Comment:                                          Diabetic Adult            <7.0                                       Healthy Adult        4.3 - 5.7                                                           (DCCT/NGSP) American Diabetes Association's Summary of Glycemic Recommendations for Adults with Diabetes: Hemoglobin A1c <7.0%. More stringent glycemic goals (A1c <6.0%) may further reduce complications at the cost of increased risk of hypoglycemia.           Passed - Cr in normal range and within 360 days    Creat  Date Value Ref Range Status  03/14/2020 0.83 0.70 - 1.33 mg/dL Final    Comment:    For patients >79 years of age, the reference limit for Creatinine is approximately 13% higher for people identified as African-American. .    Creatinine, Ser  Date Value Ref Range Status  11/19/2020 0.95 0.76 - 1.27 mg/dL Final   Creatinine, Urine  Date Value Ref Range Status  03/04/2018 244 mg/dL Final  03/04/2018 227 mg/dL Final    Comment:    Performed at [facility], [address] [place], [place] [address]          Passed - eGFR in normal range and within 360 days    GFR, Est African American  Date  Value Ref Range Status  03/14/2020 116 > OR = 60 mL/min/1.38m Final   GFR, Est Non African American  Date Value Ref Range Status  03/14/2020 100 > OR = 60 mL/min/1.734mFinal   GFR, Estimated  Date Value Ref Range Status  07/25/2020 >60 >60 mL/min Final    Comment:    (NOTE) Calculated using the CKD-EPI Creatinine Equation (2021)    eGFR  Date Value Ref Range Status  11/19/2020 95 >59 mL/min/1.73 Final          Passed - Valid encounter within last 6 months  Recent Outpatient Visits           3 months ago Chronic bilateral low back pain with bilateral sciatica   [facility] Vigg, Avanti, MD   5 months ago Essential hypertension   [facility] Vigg, Avanti, MD   5 months ago Gout, unspecified cause, unspecified chronicity, unspecified site   [facility], MD   1 year ago Type 2 diabetes mellitus with diabetic neuropathy, without long-term current use of insulin ([place])   [facility] Olin Hauser, DO   1 year ago Essential hypertension   [place], Devonne Doughty, DO

## 2021-05-27 ENCOUNTER — Other Ambulatory Visit: Payer: Self-pay | Admitting: Internal Medicine

## 2021-05-28 NOTE — Telephone Encounter (Signed)
Requested Prescriptions  Pending Prescriptions Disp Refills   TRULICITY 0.75 MG/0.5ML SOPN [Pharmacy Med Name: TRULICITY 0.75 MG/0.5ML SUBQ SOLN M] 1 mL 0    Sig: INJECT 0.75MG  INTO THE SKIN ONCE A WEEK     Endocrinology:  Diabetes - GLP-1 Receptor Agonists Failed - 05/27/2021  4:34 PM      Failed - HBA1C is between 0 and 7.9 and within 180 days    HB A1C (BAYER DCA - WAIVED)  Date Value Ref Range Status  11/19/2020 9.4 (H) <7.0 % Final    Comment:                                          Diabetic Adult            <7.0                                       Healthy Adult        4.3 - 5.7                                                           (DCCT/NGSP) American Diabetes Association's Summary of Glycemic Recommendations for Adults with Diabetes: Hemoglobin A1c <7.0%. More stringent glycemic goals (A1c <6.0%) may further reduce complications at the cost of increased risk of hypoglycemia.          Passed - Valid encounter within last 6 months    Recent Outpatient Visits          4 months ago Chronic bilateral low back pain with bilateral sciatica   [facility] Vigg, Avanti, MD   6 months ago Essential hypertension   [facility] Vigg, Avanti, MD   6 months ago Gout, unspecified cause, unspecified chronicity, unspecified site   [facility] Vigg, Avanti, MD   1 year ago Type 2 diabetes mellitus with diabetic neuropathy, without long-term current use of insulin (HCC)   [facility] Smitty Cords, DO   1 year ago Essential hypertension   [facility] [place], Netta Neat, DO

## 2021-06-21 ENCOUNTER — Other Ambulatory Visit: Payer: Self-pay | Admitting: Internal Medicine

## 2021-06-21 DIAGNOSIS — M5442 Lumbago with sciatica, left side: Secondary | ICD-10-CM

## 2021-06-21 DIAGNOSIS — G8929 Other chronic pain: Secondary | ICD-10-CM

## 2021-06-21 DIAGNOSIS — M4726 Other spondylosis with radiculopathy, lumbar region: Secondary | ICD-10-CM

## 2021-06-21 NOTE — Telephone Encounter (Signed)
Requested medication (s) are due for refill today: yes  Requested medication (s) are on the active medication list: yes  Last refill:  04/08/21 #30 0 refills  Future visit scheduled: no  Notes to clinic:  not delegated per protocol     Requested Prescriptions  Pending Prescriptions Disp Refills   cyclobenzaprine (FLEXERIL) 10 MG tablet [Pharmacy Med Name: CYCLOBENZAPRINE HCL 10 MG TAB] 30 tablet 0    Sig: TAKE 1 TABLET BY MOUTH AT BEDTIME     Not Delegated - Analgesics:  Muscle Relaxants Failed - 06/21/2021 10:42 AM      Failed - This refill cannot be delegated      Passed - Valid encounter within last 6 months    Recent Outpatient Visits           5 months ago Chronic bilateral low back pain with bilateral sciatica   [place] Vigg, Avanti, MD   7 months ago Essential hypertension   [place] Vigg, Avanti, MD   7 months ago Gout, unspecified cause, unspecified chronicity, unspecified site   [facility] Vigg, Avanti, MD   1 year ago Type 2 diabetes mellitus with diabetic neuropathy, without long-term current use of insulin ([place])   [facility] [place], Devonne Doughty, DO   1 year ago Essential hypertension   [place], Devonne Doughty, DO

## 2021-06-23 ENCOUNTER — Other Ambulatory Visit: Payer: Self-pay | Admitting: Internal Medicine

## 2021-06-23 DIAGNOSIS — G8929 Other chronic pain: Secondary | ICD-10-CM

## 2021-06-23 DIAGNOSIS — M4726 Other spondylosis with radiculopathy, lumbar region: Secondary | ICD-10-CM

## 2021-06-23 NOTE — Telephone Encounter (Signed)
Requested medication (s) are due for refill today: yes  Requested medication (s) are on the active medication list: yes  Last refill:  04/08/21 #30   Future visit scheduled: no  Notes to clinic:  med no delegated to NT to RF Needs appt but unable to call out on [facility]   Requested Prescriptions  Pending Prescriptions Disp Refills   cyclobenzaprine (FLEXERIL) 10 MG tablet [Pharmacy Med Name: CYCLOBENZAPRINE HCL 10 MG TAB] 30 tablet 0    Sig: TAKE 1 TABLET BY MOUTH AT BEDTIME     Not Delegated - Analgesics:  Muscle Relaxants Failed - 06/23/2021  1:27 PM      Failed - This refill cannot be delegated      Passed - Valid encounter within last 6 months    Recent Outpatient Visits           5 months ago Chronic bilateral low back pain with bilateral sciatica   [facility] Vigg, Avanti, MD   7 months ago Essential hypertension   [facility] Vigg, Avanti, MD   7 months ago Gout, unspecified cause, unspecified chronicity, unspecified site   [facility] Vigg, Avanti, MD   1 year ago Type 2 diabetes mellitus with diabetic neuropathy, without long-term current use of insulin (HCC)   [facility] [place], Netta Neat, DO   1 year ago Essential hypertension   [facility] [place], Netta Neat, DO

## 2021-06-24 NOTE — Telephone Encounter (Signed)
Resent to [facility].

## 2021-06-24 NOTE — Telephone Encounter (Signed)
Tried to call pt. Unable to schedule an appt.

## 2021-06-26 ENCOUNTER — Other Ambulatory Visit: Payer: Self-pay | Admitting: Internal Medicine

## 2021-06-26 NOTE — Telephone Encounter (Signed)
Requested medication (s) are due for refill today: yes  Requested medication (s) are on the active medication list: yes  Last refill:  05/28/21   Future visit scheduled: no  Notes to clinic:  Unable to refill per protocol due to failed labs, no updated results.      Requested Prescriptions  Pending Prescriptions Disp Refills   TRULICITY 0.75 MG/0.5ML SOPN [Pharmacy Med Name: TRULICITY 0.75 MG/0.5ML SUBQ SOLN M] 2 mL     Sig: INJECT 0.75MG  INTO THE SKIN ONCE A WEEK     Endocrinology:  Diabetes - GLP-1 Receptor Agonists Failed - 06/26/2021 11:26 AM      Failed - HBA1C is between 0 and 7.9 and within 180 days    HB A1C (BAYER DCA - WAIVED)  Date Value Ref Range Status  11/19/2020 9.4 (H) <7.0 % Final    Comment:                                          Diabetic Adult            <7.0                                       Healthy Adult        4.3 - 5.7                                                           (DCCT/NGSP) American Diabetes Association's Summary of Glycemic Recommendations for Adults with Diabetes: Hemoglobin A1c <7.0%. More stringent glycemic goals (A1c <6.0%) may further reduce complications at the cost of increased risk of hypoglycemia.           Passed - Valid encounter within last 6 months    Recent Outpatient Visits           5 months ago Chronic bilateral low back pain with bilateral sciatica   [facility] Vigg, Avanti, MD   7 months ago Essential hypertension   [facility] Vigg, Avanti, MD   7 months ago Gout, unspecified cause, unspecified chronicity, unspecified site   [facility] Vigg, Avanti, MD   1 year ago Type 2 diabetes mellitus with diabetic neuropathy, without long-term current use of insulin (HCC)   [facility] S[place]ty Cords, DO   1 year ago Essential hypertension   [facility] [place], Netta Neat, DO

## 2021-06-27 NOTE — Telephone Encounter (Signed)
2nd attempt- Tried to call pt, unable to leave vm

## 2021-06-28 NOTE — Telephone Encounter (Signed)
3rd attempt- Unable to leave message to schedule an appt

## 2021-07-04 ENCOUNTER — Encounter: Payer: Self-pay | Admitting: Family Medicine

## 2021-07-04 ENCOUNTER — Ambulatory Visit (INDEPENDENT_AMBULATORY_CARE_PROVIDER_SITE_OTHER): Payer: Medicaid Other | Admitting: Family Medicine

## 2021-07-04 ENCOUNTER — Other Ambulatory Visit: Payer: Self-pay

## 2021-07-04 VITALS — BP 113/74 | HR 83 | Temp 98.0°F | Wt 256.0 lb

## 2021-07-04 DIAGNOSIS — M1A4720 Other secondary chronic gout, left ankle and foot, without tophus (tophi): Secondary | ICD-10-CM

## 2021-07-04 DIAGNOSIS — E1140 Type 2 diabetes mellitus with diabetic neuropathy, unspecified: Secondary | ICD-10-CM

## 2021-07-04 DIAGNOSIS — J209 Acute bronchitis, unspecified: Secondary | ICD-10-CM

## 2021-07-04 DIAGNOSIS — Z125 Encounter for screening for malignant neoplasm of prostate: Secondary | ICD-10-CM

## 2021-07-04 DIAGNOSIS — E785 Hyperlipidemia, unspecified: Secondary | ICD-10-CM

## 2021-07-04 DIAGNOSIS — Z23 Encounter for immunization: Secondary | ICD-10-CM

## 2021-07-04 DIAGNOSIS — F419 Anxiety disorder, unspecified: Secondary | ICD-10-CM | POA: Diagnosis not present

## 2021-07-04 DIAGNOSIS — Z1159 Encounter for screening for other viral diseases: Secondary | ICD-10-CM | POA: Diagnosis not present

## 2021-07-04 DIAGNOSIS — K219 Gastro-esophageal reflux disease without esophagitis: Secondary | ICD-10-CM | POA: Diagnosis not present

## 2021-07-04 DIAGNOSIS — I10 Essential (primary) hypertension: Secondary | ICD-10-CM

## 2021-07-04 DIAGNOSIS — J440 Chronic obstructive pulmonary disease with acute lower respiratory infection: Secondary | ICD-10-CM | POA: Diagnosis not present

## 2021-07-04 DIAGNOSIS — E1169 Type 2 diabetes mellitus with other specified complication: Secondary | ICD-10-CM | POA: Diagnosis not present

## 2021-07-04 LAB — URINALYSIS, ROUTINE W REFLEX MICROSCOPIC
Bilirubin, UA: NEGATIVE
Glucose, UA: NEGATIVE
Ketones, UA: NEGATIVE
Leukocytes,UA: NEGATIVE
Nitrite, UA: NEGATIVE
Protein,UA: NEGATIVE
RBC, UA: NEGATIVE
Specific Gravity, UA: 1.015 (ref 1.005–1.030)
Urobilinogen, Ur: 0.2 mg/dL (ref 0.2–1.0)
pH, UA: 5.5 (ref 5.0–7.5)

## 2021-07-04 LAB — MICROALBUMIN, URINE WAIVED
Creatinine, Urine Waived: 50 mg/dL (ref 10–300)
Microalb, Ur Waived: 10 mg/L (ref 0–19)
Microalb/Creat Ratio: <30 mg/g (ref <30)

## 2021-07-04 LAB — BAYER DCA HB A1C WAIVED: HB A1C (BAYER DCA - WAIVED): 7 % — ABNORMAL HIGH (ref 4.8–5.6)

## 2021-07-04 MED ORDER — TRAZODONE HCL 50 MG PO TABS: 25.0000 mg | ORAL_TABLET | Freq: Every evening | ORAL | 3 refills | Status: DC | PRN

## 2021-07-04 MED ORDER — PREDNISONE 50 MG PO TABS: 50.0000 mg | ORAL_TABLET | Freq: Every day | ORAL | 0 refills | Status: DC

## 2021-07-04 MED ORDER — PANTOPRAZOLE SODIUM 40 MG PO TBEC: 40.0000 mg | DELAYED_RELEASE_TABLET | Freq: Every day | ORAL | 1 refills | Status: DC

## 2021-07-04 MED ORDER — DOXYCYCLINE HYCLATE 100 MG PO TABS: 100.0000 mg | ORAL_TABLET | Freq: Two times a day (BID) | ORAL | 0 refills | Status: DC

## 2021-07-04 MED ORDER — FLUOXETINE HCL 20 MG PO CAPS: 20.0000 mg | ORAL_CAPSULE | Freq: Every day | ORAL | 1 refills | Status: DC

## 2021-07-04 MED ORDER — TRULICITY 0.75 MG/0.5ML ~~LOC~~ SOAJ
SUBCUTANEOUS | 1 refills | Status: DC
Start: 2021-07-04 — End: 2022-02-04

## 2021-07-04 MED ORDER — LOSARTAN POTASSIUM 25 MG PO TABS: 25.0000 mg | ORAL_TABLET | Freq: Every day | ORAL | 1 refills | Status: DC

## 2021-07-04 MED ORDER — ROSUVASTATIN CALCIUM 40 MG PO TABS: 40.0000 mg | ORAL_TABLET | Freq: Every day | ORAL | 1 refills | Status: DC

## 2021-07-04 MED ORDER — METFORMIN HCL ER 750 MG PO TB24: 1500.0000 mg | ORAL_TABLET | Freq: Every day | ORAL | 1 refills | Status: DC

## 2021-07-04 MED ORDER — GABAPENTIN 300 MG PO CAPS: 300.0000 mg | ORAL_CAPSULE | Freq: Two times a day (BID) | ORAL | 1 refills | Status: DC

## 2021-07-04 NOTE — Progress Notes (Signed)
BP 113/74    Pulse 83    Temp 98 F (36.7 C)    Wt 256 lb (116.1 kg)    SpO2 96%    BMI 36.44 kg/m    Subjective:    Patient ID: Dean Clements, male    DOB: July 07, 1965, 56 y.o.   MRN: [id number]  HPI: Dean Clements is a 56 y.o. male  Chief Complaint  Patient presents with   Cough    Patient states he has been coughing for about a month and is wheezing as well   Insomnia    Patient states he has difficulty falling asleep at night, feels like he can't shut his mind off    UPPER RESPIRATORY TRACT INFECTION Druation: 1.5 months Worst symptom: wheezing, coughing Fever: no Cough: yes Shortness of breath: no Wheezing: yes Chest pain: no Chest tightness: yes Chest congestion: no Nasal congestion: no Runny nose: no Post nasal drip: no Sneezing: no Sore throat: no Swollen glands: no Sinus pressure: no Headache: no Face pain: no Toothache: no Ear pain: no  Ear pressure: no  Eyes red/itching:no Eye drainage/crusting: no  Vomiting: no Rash: no Fatigue: no Sick contacts: no Strep contacts: no  Context: stable Recurrent sinusitis: no Relief with OTC cold/cough medications: no  Treatments attempted: none  INSOMNIA Duration: chronic Satisfied with sleep quality: no Difficulty falling asleep: yes Difficulty staying asleep: no Waking a few hours after sleep onset: no Early morning awakenings: yes Daytime hypersomnolence: yes Wakes feeling refreshed: no Good sleep hygiene: yes Apnea: no Snoring: no Depressed/anxious mood: no Recent stress: no Restless legs/nocturnal leg cramps: no Chronic pain/arthritis: no History of sleep study: no Treatments attempted: melatonin, uinsom, benadryl, and ambien    DIABETES Hypoglycemic episodes:no Polydipsia/polyuria: no Visual disturbance: no Chest pain: no Paresthesias: no Glucose Monitoring: no  Accucheck frequency: Not Checking Taking Insulin?: no Blood Pressure Monitoring: not checking Retinal  Examination: Not up to Date Foot Exam: Up to Date Diabetic Education: Completed Pneumovax: Up to Date Influenza: Up to Date Aspirin: no  HYPERTENSION / HYPERLIPIDEMIA Satisfied with current treatment? yes Duration of hypertension: chronic BP monitoring frequency: not checking BP medication side effects: no Past BP meds: losartan Duration of hyperlipidemia: chronic Cholesterol medication side effects: no Cholesterol supplements: none Past cholesterol medications: crestor Medication compliance: excellent compliance Aspirin: no Recent stressors: no Recurrent headaches: no Visual changes: no Palpitations: no Dyspnea: no Chest pain: no Lower extremity edema: no Dizzy/lightheaded: no  DEPRESSION Mood status: controlled Satisfied with current treatment?: yes Symptom severity: mild  Duration of current treatment : chronic Side effects: no Medication compliance: excellent compliance Psychotherapy/counseling: no  Previous psychiatric medications: prozac Depressed mood: no Anxious mood: no Anhedonia: no Significant weight loss or gain: no Insomnia: yes  Fatigue: yes Feelings of worthlessness or guilt: no Impaired concentration/indecisiveness: no Suicidal ideations: no Hopelessness: no Crying spells: no Depression screen [facility] 2/9 11/19/2020 03/26/2020 01/17/2019 06/14/2018 11/17/2017  Decreased Interest 0 0 1 0 0  Down, Depressed, Hopeless 0 0 1 0 0  PHQ - 2 Score 0 0 2 0 0  Altered sleeping 3 - 0 - 3  Tired, decreased energy 1 - 0 - 0  Change in appetite 0 - 0 - 0  Feeling bad or failure about yourself  0 - 0 - 0  Trouble concentrating 0 - 0 - 0  Moving slowly or fidgety/restless 0 - 0 - 0  Suicidal thoughts 0 - 0 - 0  PHQ-9 Score 4 - 2 - 3  Difficult doing work/chores Not difficult at all - Not difficult at all - Somewhat difficult   Relevant past medical, surgical, family and social history reviewed and updated as indicated. Interim medical history since our last visit  reviewed. Allergies and medications reviewed and updated.  Review of Systems  Constitutional: Negative.   HENT:  Positive for congestion, postnasal drip and rhinorrhea. Negative for dental problem, drooling, ear discharge, ear pain, facial swelling, hearing loss, mouth sores, nosebleeds, sinus pressure, sinus pain, sneezing, sore throat, tinnitus, trouble swallowing and voice change.   Respiratory:  Positive for cough, shortness of breath and wheezing. Negative for apnea, choking, chest tightness and stridor.   Cardiovascular: Negative.   Gastrointestinal: Negative.   Musculoskeletal: Negative.   Psychiatric/Behavioral: Negative.     Per HPI unless specifically indicated above     Objective:    BP 113/74    Pulse 83    Temp 98 F (36.7 C)    Wt 256 lb (116.1 kg)    SpO2 96%    BMI 36.44 kg/m   Wt Readings from Last 3 Encounters:  07/04/21 256 lb (116.1 kg)  01/03/21 243 lb (110.2 kg)  11/21/20 243 lb (110.2 kg)    Physical Exam Vitals and nursing note reviewed.  Constitutional:      General: He is not in acute distress.    Appearance: Normal appearance. He is not ill-appearing, toxic-appearing or diaphoretic.  HENT:     Head: Normocephalic and atraumatic.     Right Ear: Tympanic membrane, ear canal and external ear normal.     Left Ear: Tympanic membrane, ear canal and external ear normal.     Nose: Congestion and rhinorrhea present.     Mouth/Throat:     Mouth: Mucous membranes are moist.     Pharynx: Oropharynx is clear. No oropharyngeal exudate or posterior oropharyngeal erythema.  Eyes:     General: No scleral icterus.       Right eye: No discharge.        Left eye: No discharge.     Extraocular Movements: Extraocular movements intact.     Conjunctiva/sclera: Conjunctivae normal.     Pupils: Pupils are equal, round, and reactive to light.  Cardiovascular:     Rate and Rhythm: Normal rate and regular rhythm.     Pulses: Normal pulses.     Heart sounds: Normal heart  sounds. No murmur heard.   No friction rub. No gallop.  Pulmonary:     Effort: Pulmonary effort is normal. No respiratory distress.     Breath sounds: No stridor. Wheezing and rhonchi present. No rales.  Chest:     Chest wall: No tenderness.  Musculoskeletal:        General: Normal range of motion.     Cervical back: Normal range of motion and neck supple.  Skin:    General: Skin is warm and dry.     Capillary Refill: Capillary refill takes less than 2 seconds.     Coloration: Skin is not jaundiced or pale.     Findings: No bruising, erythema, lesion or rash.  Neurological:     General: No focal deficit present.     Mental Status: He is alert and oriented to person, place, and time. Mental status is at baseline.  Psychiatric:        Mood and Affect: Mood normal.        Behavior: Behavior normal.        Thought Content: Thought content normal.  Judgment: Judgment normal.    Results for orders placed or performed in visit on 07/04/21  Comprehensive metabolic panel  Result Value Ref Range   Glucose 207 (H) 70 - 99 mg/dL   BUN 9 6 - 24 mg/dL   Creatinine, Ser 0.98 0.76 - 1.27 mg/dL   eGFR 91 >59 mL/min/1.73   BUN/Creatinine Ratio 9 9 - 20   Sodium 138 134 - 144 mmol/L   Potassium 4.1 3.5 - 5.2 mmol/L   Chloride 98 96 - 106 mmol/L   CO2 26 20 - 29 mmol/L   Calcium 8.9 8.7 - 10.2 mg/dL   Total Protein 5.6 (L) 6.0 - 8.5 g/dL   Albumin 3.9 3.8 - 4.9 g/dL   Globulin, Total 1.7 1.5 - 4.5 g/dL   Albumin/Globulin Ratio 2.3 (H) 1.2 - 2.2   Bilirubin Total 0.5 0.0 - 1.2 mg/dL   Alkaline Phosphatase 68 44 - 121 IU/L   AST 18 0 - 40 IU/L   ALT 17 0 - 44 IU/L  CBC with Differential/Platelet  Result Value Ref Range   WBC 7.6 3.4 - 10.8 x10E3/uL   RBC 5.30 4.14 - 5.80 x10E6/uL   Hemoglobin 16.0 13.0 - 17.7 g/dL   Hematocrit 47.2 37.5 - 51.0 %   MCV 89 79 - 97 fL   MCH 30.2 26.6 - 33.0 pg   MCHC 33.9 31.5 - 35.7 g/dL   RDW 12.5 11.6 - 15.4 %   Platelets 235 150 - 450  x10E3/uL   Neutrophils 65 Not Estab. %   Lymphs 19 Not Estab. %   Monocytes 8 Not Estab. %   Eos 7 Not Estab. %   Basos 1 Not Estab. %   Neutrophils Absolute 5.0 1.4 - 7.0 x10E3/uL   Lymphocytes Absolute 1.5 0.7 - 3.1 x10E3/uL   Monocytes Absolute 0.6 0.1 - 0.9 x10E3/uL   EOS (ABSOLUTE) 0.5 (H) 0.0 - 0.4 x10E3/uL   Basophils Absolute 0.1 0.0 - 0.2 x10E3/uL   Immature Granulocytes 0 Not Estab. %   Immature Grans (Abs) 0.0 0.0 - 0.1 x10E3/uL  Lipid Panel w/o Chol/HDL Ratio  Result Value Ref Range   Cholesterol, Total 217 (H) 100 - 199 mg/dL   Triglycerides 271 (H) 0 - 149 mg/dL   HDL 36 (L) >39 mg/dL   VLDL Cholesterol Cal 49 (H) 5 - 40 mg/dL   LDL Chol Calc (NIH) 132 (H) 0 - 99 mg/dL  PSA  Result Value Ref Range   Prostate Specific Ag, Serum 1.0 0.0 - 4.0 ng/mL  TSH  Result Value Ref Range   TSH 2.720 0.450 - 4.500 uIU/mL  Urinalysis, Routine w reflex microscopic  Result Value Ref Range   Specific Gravity, UA 1.015 1.005 - 1.030   pH, UA 5.5 5.0 - 7.5   Color, UA Yellow Yellow   Appearance Ur Clear Clear   Leukocytes,UA Negative Negative   Protein,UA Negative Negative/Trace   Glucose, UA Negative Negative   Ketones, UA Negative Negative   RBC, UA Negative Negative   Bilirubin, UA Negative Negative   Urobilinogen, Ur 0.2 0.2 - 1.0 mg/dL   Nitrite, UA Negative Negative  Bayer DCA Hb A1c Waived  Result Value Ref Range   HB A1C (BAYER DCA - WAIVED) 7.0 (H) 4.8 - 5.6 %  Microalbumin, Urine Waived  Result Value Ref Range   Microalb, Ur Waived 10 0 - 19 mg/L   Creatinine, Urine Waived 50 10 - 300 mg/dL   Microalb/Creat Ratio <30 <30 mg/g  Uric acid  Result Value Ref Range   Uric Acid 7.9 3.8 - 8.4 mg/dL  Hepatitis C Antibody  Result Value Ref Range   Hep C Virus Ab <0.1 0.0 - 0.9 s/co ratio      Assessment & Plan:   Problem List Items Addressed This Visit       Cardiovascular and Mediastinum   Essential hypertension    Under good control on current regimen.  Continue current regimen. Continue to monitor. Call with any concerns. Refills given. Labs drawn today.       Relevant Medications   losartan (COZAAR) 25 MG tablet   rosuvastatin (CRESTOR) 40 MG tablet   Other Relevant Orders   Comprehensive metabolic panel (Completed)   CBC with Differential/Platelet (Completed)   TSH (Completed)   Urinalysis, Routine w reflex microscopic (Completed)   Microalbumin, Urine Waived (Completed)     Digestive   GERD (gastroesophageal reflux disease)    Under good control on current regimen. Continue current regimen. Continue to monitor. Call with any concerns. Refills given. Labs drawn today.      Relevant Medications   pantoprazole (PROTONIX) 40 MG tablet   Other Relevant Orders   Comprehensive metabolic panel (Completed)   CBC with Differential/Platelet (Completed)     Endocrine   Type 2 diabetes mellitus with diabetic neuropathy ([place])    Under good control with A1c of 7.0. Continue current regimen. Continue to monitor. Call with any concerns. Refills given.       Relevant Medications   losartan (COZAAR) 25 MG tablet   metFORMIN (GLUCOPHAGE-XR) 750 MG 24 hr tablet   rosuvastatin (CRESTOR) 40 MG tablet   Dulaglutide (TRULICITY) 7.54 GB/2.0FE SOPN   Other Relevant Orders   Comprehensive metabolic panel (Completed)   CBC with Differential/Platelet (Completed)   TSH (Completed)   Bayer DCA Hb A1c Waived (Completed)   Microalbumin, Urine Waived (Completed)   Hyperlipidemia associated with type 2 diabetes mellitus ([place])    Under good control on current regimen. Continue current regimen. Continue to monitor. Call with any concerns. Refills given. Labs drawn today.      Relevant Medications   losartan (COZAAR) 25 MG tablet   metFORMIN (GLUCOPHAGE-XR) 750 MG 24 hr tablet   rosuvastatin (CRESTOR) 40 MG tablet   Dulaglutide (TRULICITY) 0.71 QR/9.7JO SOPN   Other Relevant Orders   Comprehensive metabolic panel (Completed)   CBC with  Differential/Platelet (Completed)   Lipid Panel w/o Chol/HDL Ratio (Completed)     Other   Anxiety    Under good control on current regimen. Continue current regimen. Continue to monitor. Call with any concerns. Refills given.        Relevant Medications   traZODone (DESYREL) 50 MG tablet   FLUoxetine (PROZAC) 20 MG capsule   Gout    Under good control on current regimen. Continue current regimen. Continue to monitor. Call with any concerns. Refills given. Labs drawn today.       Relevant Orders   Comprehensive metabolic panel (Completed)   CBC with Differential/Platelet (Completed)   Uric acid (Completed)   Other Visit Diagnoses     Acute bronchitis with COPD ([place])    -  Primary   Will treat with prednisone and doxycycline. Return 2 weeks for lung recheck with spiro. Likely will need inhaler.    Relevant Medications   predniSONE (DELTASONE) 50 MG tablet   Need for hepatitis C screening test       Labs drawn today. Await results.   Relevant Orders  Hepatitis C Antibody (Completed)   Screening for prostate cancer       Labs drawn today. Await results.   Relevant Orders   PSA (Completed)   Need for influenza vaccination       Flu shot given today.   Relevant Orders   Flu Vaccine QUAD 6+ mos PF IM (Fluarix Quad PF) (Completed)        Follow up plan: Return in about 2 weeks (around 07/18/2021) for follow up breathing with spiro.

## 2021-07-05 ENCOUNTER — Encounter: Payer: Self-pay | Admitting: Family Medicine

## 2021-07-05 LAB — CBC WITH DIFFERENTIAL/PLATELET
Basophils Absolute: 0.1 10*3/uL (ref 0.0–0.2)
Basos: 1 %
EOS (ABSOLUTE): 0.5 10*3/uL — ABNORMAL HIGH (ref 0.0–0.4)
Eos: 7 %
Hematocrit: 47.2 % (ref 37.5–51.0)
Hemoglobin: 16 g/dL (ref 13.0–17.7)
Immature Grans (Abs): 0 10*3/uL (ref 0.0–0.1)
Immature Granulocytes: 0 %
Lymphocytes Absolute: 1.5 10*3/uL (ref 0.7–3.1)
Lymphs: 19 %
MCH: 30.2 pg (ref 26.6–33.0)
MCHC: 33.9 g/dL (ref 31.5–35.7)
MCV: 89 fL (ref 79–97)
Monocytes Absolute: 0.6 10*3/uL (ref 0.1–0.9)
Monocytes: 8 %
Neutrophils Absolute: 5 10*3/uL (ref 1.4–7.0)
Neutrophils: 65 %
Platelets: 235 10*3/uL (ref 150–450)
RBC: 5.3 x10E6/uL (ref 4.14–5.80)
RDW: 12.5 % (ref 11.6–15.4)
WBC: 7.6 10*3/uL (ref 3.4–10.8)

## 2021-07-05 LAB — URIC ACID: Uric Acid: 7.9 mg/dL (ref 3.8–8.4)

## 2021-07-05 LAB — COMPREHENSIVE METABOLIC PANEL
ALT: 17 IU/L (ref 0–44)
AST: 18 IU/L (ref 0–40)
Albumin/Globulin Ratio: 2.3 — ABNORMAL HIGH (ref 1.2–2.2)
Albumin: 3.9 g/dL (ref 3.8–4.9)
Alkaline Phosphatase: 68 IU/L (ref 44–121)
BUN/Creatinine Ratio: 9 (ref 9–20)
BUN: 9 mg/dL (ref 6–24)
Bilirubin Total: 0.5 mg/dL (ref 0.0–1.2)
CO2: 26 mmol/L (ref 20–29)
Calcium: 8.9 mg/dL (ref 8.7–10.2)
Chloride: 98 mmol/L (ref 96–106)
Creatinine, Ser: 0.98 mg/dL (ref 0.76–1.27)
Globulin, Total: 1.7 g/dL (ref 1.5–4.5)
Glucose: 207 mg/dL — ABNORMAL HIGH (ref 70–99)
Potassium: 4.1 mmol/L (ref 3.5–5.2)
Sodium: 138 mmol/L (ref 134–144)
Total Protein: 5.6 g/dL — ABNORMAL LOW (ref 6.0–8.5)
eGFR: 91 mL/min/{1.73_m2} (ref >59)

## 2021-07-05 LAB — PSA: Prostate Specific Ag, Serum: 1 ng/mL (ref 0.0–4.0)

## 2021-07-05 LAB — LIPID PANEL W/O CHOL/HDL RATIO
Cholesterol, Total: 217 mg/dL — ABNORMAL HIGH (ref 100–199)
HDL: 36 mg/dL — ABNORMAL LOW (ref >39)
LDL Chol Calc (NIH): 132 mg/dL — ABNORMAL HIGH (ref 0–99)
Triglycerides: 271 mg/dL — ABNORMAL HIGH (ref 0–149)
VLDL Cholesterol Cal: 49 mg/dL — ABNORMAL HIGH (ref 5–40)

## 2021-07-05 LAB — TSH: TSH: 2.72 u[IU]/mL (ref 0.450–4.500)

## 2021-07-05 LAB — HEPATITIS C ANTIBODY: Hep C Virus Ab: <0.1 s/co ratio (ref 0.0–0.9)

## 2021-07-05 NOTE — Assessment & Plan Note (Signed)
Under good control on current regimen. Continue current regimen. Continue to monitor. Call with any concerns. Refills given. Labs drawn today.   

## 2021-07-05 NOTE — Assessment & Plan Note (Signed)
Under good control with A1c of 7.0. Continue current regimen. Continue to monitor. Call with any concerns. Refills given.

## 2021-07-05 NOTE — Assessment & Plan Note (Signed)
Under good control on current regimen. Continue current regimen. Continue to monitor. Call with any concerns. Refills given.   

## 2021-07-18 ENCOUNTER — Ambulatory Visit: Payer: Medicaid Other | Admitting: Family Medicine

## 2021-07-31 ENCOUNTER — Other Ambulatory Visit: Payer: Self-pay

## 2021-07-31 ENCOUNTER — Other Ambulatory Visit: Payer: Self-pay | Admitting: Obstetrics and Gynecology

## 2021-07-31 NOTE — Patient Instructions (Signed)
Visit Information  Mr. Bramer was given information about Medicaid Managed Care team care coordination services as a part of their [facility] Medicaid benefit. [name] [facility] verbally consented to engagement with the [facility] Managed Care team.   If you are experiencing a medical emergency, please call 911 or report to your local emergency department or urgent care.   If you have a non-emergency medical problem during routine business hours, please contact your provider's office and ask to speak with a nurse.   For questions related to your [facility], please call: [phone] or visit the homepage here: [email or web address]  If you would like to schedule transportation through your [facility], please call the following number at least 2 days in advance of your appointment: [phone].  Rides for urgent appointments can also be made after hours by calling Member Services.  Call the Behavioral Health Crisis Line at [phone], at any time, 24 hours a day, 7 days a week. If you are in danger or need immediate medical attention call 911.  If you would like help to quit smoking, call 1-800-QUIT-NOW (708 379 8239) OR Espaol: 1-855-Djelo-Ya (4-825-003-7048) o para ms informacin haga clic aqu or Text READY to 889-169 to register via text  Mr. Dorantes - following are the goals we discussed in your visit today:   Goals Addressed    imeframe:  Long-Range Goal Priority:  High Start Date:        07/31/21                     Expected End Date:      ongoing                 Follow Up Date 08/28/21    - schedule appointment for flu shot - schedule appointment for vaccines needed due to my age or health - schedule recommended health tests - schedule and keep appointment for annual check-up    Why is this important?   Screening tests can find diseases early  when they are easier to treat.  Your doctor or nurse will talk with you about which tests are important for you.  Getting shots for common diseases like the flu and shingles will help prevent them.    The patient verbalized understanding of instructions, educational materials, and care plan provided today and agreed to receive a mailed copy of patient instructions, educational materials, and care plan.   The Managed Medicaid care management team will reach out to the patient again over the next 30 days.  The  Patient  has been provided with contact information for the Managed Medicaid care management team and has been advised to call with any health related questions or concerns.   Kathi Der RN, BSN [place]   [facility] Care Management Coordinator - Managed Medicaid High Risk [phone]   Following is a copy of your plan of care:  Care Plan : RN Care Manager Plan of Care  Updates made by Danie Chandler, RN since 07/31/2021 12:00 AM     Problem: Chronic Disease Management and Care Coordination Needs   Priority: High     Long-Range Goal: Self-Management Plan Developed   Start Date: 07/31/2021  Expected End Date: 10/28/2021  This Visit's Progress: On track  Priority: High  Note:   Current Barriers:  Knowledge Deficits related to plan of care for management of chronic pain  Care Coordination needs related to chronic pain  Chronic Disease Management support and education needs related to chronic pain   RNCM Clinical Goal(s):  Patient will verbalize understanding of plan for management of chronic pain  take all medications exactly as prescribed and will call provider for medication related questions demonstrate understanding of rationale for each prescribed medication attend all scheduled medical appointments demonstrate Ongoing adherence to prescribed treatment plan for chronic pain continue to work with RN Care Manager to address care management and care  coordination needs related to  chronic pain as evidenced by adherence to CM Team Scheduled appointments work with pharmacist to address medications  related to chronic pain  as evidenced by review or EMR and patient or pharmacist report through collaboration with RN Care manager, provider, and care team.   Interventions: Inter-disciplinary care team collaboration (see longitudinal plan of care) Evaluation of current treatment plan related to  self management and patient's adherence to plan as established by provider  Pain Interventions:  (Status:  New goal.) Long Term Goal Pain assessment performed Medications reviewed Reviewed provider established plan for pain management  Patient Goals/Self-Care Activities: Take all medications as prescribed Attend all scheduled provider appointments Call pharmacy for medication refills 3-7 days in advance of running out of medications Attend church or other social activities Perform all self care activities independently  Perform IADL's (shopping, preparing meals, housekeeping, managing finances) independently Call provider office for new concerns or questions   Follow Up Plan:  The patient has been provided with contact information for the care management team and has been advised to call with any health related questions or concerns.  The Managed Medicaid Team will follow up within 30 days.

## 2021-07-31 NOTE — Patient Outreach (Signed)
Medicaid Managed Care   Nurse Care Manager Note  07/31/2021 Name:  Dean Clements MRN:  [id number] DOB:  [birth year]  Dean Clements is an 56 y.o. year old male who is a primary patient of Vigg, Avanti, MD.  The Medicaid Managed Care Coordination team was consulted for assistance with:    Chronic healthcare management needs, HTN, DM, GERD, HLD, osteoarthritis, back pain, anxiety, neuropathy  Dean Clements was given information about Medicaid Managed Care Coordination team services today. Dean Clements [facility] Patient agreed to services and verbal consent obtained.  Engaged with patient by telephone for initial visit in response to provider referral for case management and/or care coordination services.   Assessments/Interventions:  Review of past medical history, allergies, medications, health status, including review of consultants reports, laboratory and other test data, was performed as part of comprehensive evaluation and provision of chronic care management services.  SDOH (Social Determinants of Health) assessments and interventions performed: SDOH Interventions    Flowsheet Row Most Recent Value  SDOH Interventions   Food Insecurity Interventions Intervention Not Indicated      Care Plan No Known Allergies  Medications Reviewed Today     Reviewed by Danie Chandler, RN (Registered Nurse) on 07/31/21 at 1331  Med List Status: <None>   Medication Order Taking? Sig Documenting Provider Last Dose Status Informant  Accu-Chek Softclix Lancets lancets [id number]  SMARTSIG:Topical [provider]  Active   cyclobenzaprine (FLEXERIL) 10 MG tablet [id number] No TAKE 1 TABLET BY MOUTH AT BEDTIME  Patient not taking: Reported on 07/31/2021   Loura Pardon, MD Not Taking Active   doxycycline (VIBRA-TABS) 100 MG tablet [id number] No Take 1 tablet (100 mg total) by mouth 2 (two) times daily.  Patient not taking: Reported on 07/31/2021   Dorcas Carrow, DO Not  Taking Active   Dulaglutide (TRULICITY) 0.75 MG/0.5ML Namon Cirri [id number] Yes INJECT 0.75MG  INTO THE SKIN ONCE A WEEK Johnson, Megan P, DO Taking Active   FLUoxetine (PROZAC) 20 MG capsule [id number] No Take 1 capsule (20 mg total) by mouth daily.  Patient not taking: Reported on 07/31/2021   Dorcas Carrow, DO Not Taking Active   gabapentin (NEURONTIN) 300 MG capsule [id number] Yes Take 1 capsule (300 mg total) by mouth 2 (two) times daily. Johnson, Megan P, DO Taking Active   losartan (COZAAR) 25 MG tablet [id number] Yes Take 1 tablet (25 mg total) by mouth daily. Laural Benes, Megan P, DO Taking Active   metFORMIN (GLUCOPHAGE-XR) 750 MG 24 hr tablet [id number] Yes Take 2 tablets (1,500 mg total) by mouth daily with breakfast. Olevia Perches P, DO Taking Active   pantoprazole (PROTONIX) 40 MG tablet [id number] Yes Take 1 tablet (40 mg total) by mouth daily. Laural Benes, Megan P, DO Taking Active   predniSONE (DELTASONE) 50 MG tablet [id number] No Take 1 tablet (50 mg total) by mouth daily with breakfast.  Patient not taking: Reported on 07/31/2021   Dorcas Carrow, DO Not Taking Active   rosuvastatin (CRESTOR) 40 MG tablet [id number] Yes Take 1 tablet (40 mg total) by mouth daily. Olevia Perches P, DO Taking Active   traZODone (DESYREL) 50 MG tablet [id number] Yes Take 0.5-1 tablets (25-50 mg total) by mouth at bedtime as needed for sleep. Dorcas Carrow, DO Taking Active            Patient Active Problem List   Diagnosis Date Noted   HNP (herniated nucleus pulposus), lumbar 07/27/2020   Spinal stenosis of lumbosacral region 05/17/2020  Lumbar spondylitis (HCC) 05/02/2020   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HCC) 04/19/2020   Polyneuropathy 03/12/2020   Herniated lumbar disc without myelopathy 02/02/2020   Carpal tunnel syndrome 12/23/2019   Lumbago with sciatica, unspecified side 12/23/2019   Neuropathy 12/23/2019   Osteoarthritis of spine with radiculopathy, cervical region 12/09/2019    Osteoarthritis of spine with radiculopathy, lumbar region 09/07/2019   Background diabetic retinopathy (HCC) 05/30/2019   Gout 06/14/2018   Hyperlipidemia associated with type 2 diabetes mellitus (HCC) 06/14/2018   Chronic bilateral back pain 06/04/2016   Type 2 diabetes mellitus with diabetic neuropathy (HCC) 09/10/2015   Essential hypertension 04/03/2015   Anxiety 04/03/2015   GERD (gastroesophageal reflux disease) 01/15/2015   Insomnia 01/15/2015   Obesity (BMI 30.0-34.9) 01/15/2015   Conditions to be addressed/monitored per PCP order:  Chronic healthcare management needs, HTN, DM, GERD, HLD, osteoarthritis, back pain, anxiety, neuropathy  Care Plan : RN Care Manager Plan of Care  Updates made by Danie Chandler, RN since 07/31/2021 12:00 AM     Problem: Chronic Disease Management and Care Coordination Needs   Priority: High     Long-Range Goal: Self-Management Plan Developed   Start Date: 07/31/2021  Expected End Date: 10/28/2021  This Visit's Progress: On track  Priority: High  Note:   Current Barriers:  Knowledge Deficits related to plan of care for management of chronic pain  Care Coordination needs related to chronic pain Chronic Disease Management support and education needs related to chronic pain   RNCM Clinical Goal(s):  Patient will verbalize understanding of plan for management of chronic pain  take all medications exactly as prescribed and will call provider for medication related questions demonstrate understanding of rationale for each prescribed medication attend all scheduled medical appointments demonstrate Ongoing adherence to prescribed treatment plan for chronic pain continue to work with RN Care Manager to address care management and care coordination needs related to  chronic pain as evidenced by adherence to CM Team Scheduled appointments work with pharmacist to address medications  related to chronic pain  as evidenced by review or EMR and patient or  pharmacist report through collaboration with RN Care manager, provider, and care team.   Interventions: Inter-disciplinary care team collaboration (see longitudinal plan of care) Evaluation of current treatment plan related to  self management and patient's adherence to plan as established by provider  Pain Interventions:  (Status:  New goal.) Long Term Goal Pain assessment performed Medications reviewed Reviewed provider established plan for pain management  Patient Goals/Self-Care Activities: Take all medications as prescribed Attend all scheduled provider appointments Call pharmacy for medication refills 3-7 days in advance of running out of medications Attend church or other social activities Perform all self care activities independently  Perform IADL's (shopping, preparing meals, housekeeping, managing finances) independently Call provider office for new concerns or questions   Follow Up Plan:  The patient has been provided with contact information for the care management team and has been advised to call with any health related questions or concerns.  The Managed Medicaid Team will follow up within 30 days.    Long-Range Goal: Establish Plan of Care for Chronic Disease Management Needs   Start Date: 07/31/2021  Expected End Date: 10/28/2021  Priority: High  Note:   Timeframe:  Long-Range Goal Priority:  High Start Date:        07/31/21                     Expected End Date:  ongoing                 Follow Up Date 08/28/21    - schedule appointment for flu shot - schedule appointment for vaccines needed due to my age or health - schedule recommended health tests - schedule and keep appointment for annual check-up    Why is this important?   Screening tests can find diseases early when they are easier to treat.  Your doctor or nurse will talk with you about which tests are important for you.  Getting shots for common diseases like the flu and shingles will help prevent  them.      Follow Up:  Patient agrees to Care Plan and Follow-up.  Plan: The Managed Medicaid care management team will reach out to the patient again over the next 30 days. and The  Patient has been provided with contact information for the Managed Medicaid care management team and has been advised to call with any health related questions or concerns.  Date/time of next scheduled RN care management/care coordination outreach:  08/28/21 at 1230.

## 2021-08-01 ENCOUNTER — Other Ambulatory Visit: Payer: Self-pay | Admitting: Internal Medicine

## 2021-08-01 DIAGNOSIS — M4726 Other spondylosis with radiculopathy, lumbar region: Secondary | ICD-10-CM

## 2021-08-01 DIAGNOSIS — G8929 Other chronic pain: Secondary | ICD-10-CM

## 2021-08-01 DIAGNOSIS — M5442 Lumbago with sciatica, left side: Secondary | ICD-10-CM

## 2021-08-01 NOTE — Telephone Encounter (Signed)
Requested medication (s) are due for refill today:   Provider to review  Requested medication (s) are on the active medication list:   Yes  Future visit scheduled:   No   Last ordered: 04/08/2021 #30, 0 refills  Non delegated refill   Requested Prescriptions  Pending Prescriptions Disp Refills   cyclobenzaprine (FLEXERIL) 10 MG tablet [Pharmacy Med Name: CYCLOBENZAPRINE HCL 10 MG TAB] 30 tablet 0    Sig: TAKE 1 TABLET BY MOUTH AT BEDTIME     Not Delegated - Analgesics:  Muscle Relaxants Failed - 08/01/2021 12:18 PM      Failed - This refill cannot be delegated      Passed - Valid encounter within last 6 months    Recent Outpatient Visits           4 weeks ago Acute bronchitis with COPD (HCC)   [facility] Johnson, Megan P, DO   7 months ago Chronic bilateral low back pain with bilateral sciatica   [facility] Vigg, Avanti, MD   8 months ago Essential hypertension   [facility] Vigg, Avanti, MD   8 months ago Gout, unspecified cause, unspecified chronicity, unspecified site   [facility] Vigg, Avanti, MD   1 year ago Type 2 diabetes mellitus with diabetic neuropathy, without long-term current use of insulin (HCC)   [facility] [place], Netta Neat, DO

## 2021-08-07 ENCOUNTER — Ambulatory Visit: Payer: Self-pay | Admitting: *Deleted

## 2021-08-07 NOTE — Telephone Encounter (Signed)
Reason for Disposition ? [1] SEVERE back pain (e.g., excruciating, unable to do any normal activities) AND [2] not improved 2 hours after pain medicine ? ?Answer Assessment - Initial Assessment Questions ?1. ONSET: "When did the pain begin?"  ?    Since surgery- if patient active he has pain ?2. LOCATION: "Where does it hurt?" (upper, mid or lower back) ?    R lower back ?3. SEVERITY: "How bad is the pain?"  (e.g., Scale 1-10; mild, moderate, or severe) ?  - MILD (1-3): doesn't interfere with normal activities  ?  - MODERATE (4-7): interferes with normal activities or awakens from sleep  ?  - SEVERE (8-10): excruciating pain, unable to do any normal activities  ?    severe ?4. PATTERN: "Is the pain constant?" (e.g., yes, no; constant, intermittent)  ?    constant ?5. RADIATION: "Does the pain shoot into your legs or elsewhere?" ?    no ?6. CAUSE:  "What do you think is causing the back pain?"  ?    Overuse- possible sciatica  ?7. BACK OVERUSE:  "Any recent lifting of heavy objects, strenuous work or exercise?" ?    Yes- getting ready to move ?8. MEDICATIONS: "What have you taken so far for the pain?" (e.g., nothing, acetaminophen, NSAIDS) ?    Aleve ?9. NEUROLOGIC SYMPTOMS: "Do you have any weakness, numbness, or problems with bowel/bladder control?" ?    no ?10. OTHER SYMPTOMS: "Do you have any other symptoms?" (e.g., fever, abdominal pain, burning with urination, blood in urine) ?      No other pain ?11. PREGNANCY: "Is there any chance you are pregnant?" (e.g., yes, no; LMP) ?      *No Answer* ? ?Protocols used: Back Pain-A-AH ? ?

## 2021-08-07 NOTE — Telephone Encounter (Signed)
?  Chief Complaint: back pain ?Symptoms: back pain- over use of back ?Frequency: pain since surgery- but normally stops when has pain- patient reports he can  ot stop now- he has sold his house and is moving ?Pertinent Negatives: Patient denies radiation of pain ?Disposition: [] ED /[] Urgent Care (no appt availability in office) / [x] Appointment(In office/virtual)/ []  [place] [facility]/ [] Home Care/ [] Refused Recommended Disposition /[] [place] [facility]/ []  Follow-up with PCP ?Additional Notes: Patient called at 4:13- advised UC patient states he would rather have appointment in office- scheduled am appointment  ?

## 2021-08-08 ENCOUNTER — Encounter: Payer: Self-pay | Admitting: Internal Medicine

## 2021-08-08 ENCOUNTER — Other Ambulatory Visit: Payer: Self-pay

## 2021-08-08 ENCOUNTER — Ambulatory Visit (INDEPENDENT_AMBULATORY_CARE_PROVIDER_SITE_OTHER): Payer: Medicaid Other | Admitting: Internal Medicine

## 2021-08-08 VITALS — BP 112/67 | HR 105 | Temp 97.9°F | Ht 70.28 in | Wt 265.5 lb

## 2021-08-08 DIAGNOSIS — G8929 Other chronic pain: Secondary | ICD-10-CM

## 2021-08-08 DIAGNOSIS — M549 Dorsalgia, unspecified: Secondary | ICD-10-CM

## 2021-08-08 MED ORDER — MELOXICAM 15 MG PO TABS: 15.0000 mg | ORAL_TABLET | Freq: Two times a day (BID) | ORAL | 0 refills | Status: AC

## 2021-08-08 NOTE — Progress Notes (Signed)
BP 112/67    Pulse (!) 105    Temp 97.9 F (36.6 C) (Oral)    Ht 5' 10.28" (1.785 m)    Wt 265 lb 8 oz (120.4 kg)    SpO2 91%    BMI 37.80 kg/m    Subjective:    Patient ID: Dean Clements, male    DOB: 1965/10/24, 56 y.o.   MRN: [id number]  Chief Complaint  Patient presents with   Back Pain    For past 4 days    HPI: Dean Clements is a 56 y.o. male  Back Pain This is a chronic (pain started closing his house on the 16th, has a poacking and moving and cleanign up startd hurting right top of his back above his buttock started x 2 days now.) problem. Pertinent negatives include no abdominal pain, chest pain, dysuria, fever, headaches, numbness or weakness.   Chief Complaint  Patient presents with   Back Pain    For past 4 days    Relevant past medical, surgical, family and social history reviewed and updated as indicated. Interim medical history since our last visit reviewed. Allergies and medications reviewed and updated.  Review of Systems  Constitutional:  Negative for activity change, appetite change, chills, fatigue and fever.  HENT:  Negative for congestion, ear discharge, ear pain and facial swelling.   Eyes:  Negative for pain, discharge and itching.  Respiratory:  Negative for cough, chest tightness, shortness of breath and wheezing.   Cardiovascular:  Negative for chest pain, palpitations and leg swelling.  Gastrointestinal:  Negative for abdominal distention, abdominal pain, blood in stool, constipation, diarrhea, nausea and vomiting.  Endocrine: Negative for cold intolerance, heat intolerance, polydipsia, polyphagia and polyuria.  Genitourinary:  Negative for difficulty urinating, dysuria, flank pain, frequency, hematuria and urgency.  Musculoskeletal:  Positive for back pain. Negative for arthralgias, gait problem, joint swelling and myalgias.  Skin:  Negative for color change, rash and wound.  Neurological:  Negative for dizziness, tremors,  speech difficulty, weakness, light-headedness, numbness and headaches.  Hematological:  Does not bruise/bleed easily.  Psychiatric/Behavioral:  Negative for agitation, confusion, decreased concentration, sleep disturbance and suicidal ideas.    Per HPI unless specifically indicated above     Objective:    BP 112/67    Pulse (!) 105    Temp 97.9 F (36.6 C) (Oral)    Ht 5' 10.28" (1.785 m)    Wt 265 lb 8 oz (120.4 kg)    SpO2 91%    BMI 37.80 kg/m   Wt Readings from Last 3 Encounters:  08/08/21 265 lb 8 oz (120.4 kg)  07/04/21 256 lb (116.1 kg)  01/03/21 243 lb (110.2 kg)    Physical Exam Vitals and nursing note reviewed.  Constitutional:      Appearance: Normal appearance.  HENT:     Nose: No congestion or rhinorrhea.     Mouth/Throat:     Pharynx: No oropharyngeal exudate or posterior oropharyngeal erythema.  Pulmonary:     Breath sounds: No wheezing or rhonchi.  Chest:     Chest wall: No tenderness.  Musculoskeletal:        General: Tenderness present. No swelling, deformity or signs of injury.     Right lower leg: No edema.     Left lower leg: No edema.     Comments: Right paraspinal ms tenderness noted  Skin:    General: Skin is dry.  Neurological:     Mental Status: He is  alert.    Results for orders placed or performed in visit on 07/04/21  Comprehensive metabolic panel  Result Value Ref Range   Glucose 207 (H) 70 - 99 mg/dL   BUN 9 6 - 24 mg/dL   Creatinine, Ser 0.98 0.76 - 1.27 mg/dL   eGFR 91 >59 mL/min/1.73   BUN/Creatinine Ratio 9 9 - 20   Sodium 138 134 - 144 mmol/L   Potassium 4.1 3.5 - 5.2 mmol/L   Chloride 98 96 - 106 mmol/L   CO2 26 20 - 29 mmol/L   Calcium 8.9 8.7 - 10.2 mg/dL   Total Protein 5.6 (L) 6.0 - 8.5 g/dL   Albumin 3.9 3.8 - 4.9 g/dL   Globulin, Total 1.7 1.5 - 4.5 g/dL   Albumin/Globulin Ratio 2.3 (H) 1.2 - 2.2   Bilirubin Total 0.5 0.0 - 1.2 mg/dL   Alkaline Phosphatase 68 44 - 121 IU/L   AST 18 0 - 40 IU/L   ALT 17 0 - 44 IU/L   CBC with Differential/Platelet  Result Value Ref Range   WBC 7.6 3.4 - 10.8 x10E3/uL   RBC 5.30 4.14 - 5.80 x10E6/uL   Hemoglobin 16.0 13.0 - 17.7 g/dL   Hematocrit 47.2 37.5 - 51.0 %   MCV 89 79 - 97 fL   MCH 30.2 26.6 - 33.0 pg   MCHC 33.9 31.5 - 35.7 g/dL   RDW 12.5 11.6 - 15.4 %   Platelets 235 150 - 450 x10E3/uL   Neutrophils 65 Not Estab. %   Lymphs 19 Not Estab. %   Monocytes 8 Not Estab. %   Eos 7 Not Estab. %   Basos 1 Not Estab. %   Neutrophils Absolute 5.0 1.4 - 7.0 x10E3/uL   Lymphocytes Absolute 1.5 0.7 - 3.1 x10E3/uL   Monocytes Absolute 0.6 0.1 - 0.9 x10E3/uL   EOS (ABSOLUTE) 0.5 (H) 0.0 - 0.4 x10E3/uL   Basophils Absolute 0.1 0.0 - 0.2 x10E3/uL   Immature Granulocytes 0 Not Estab. %   Immature Grans (Abs) 0.0 0.0 - 0.1 x10E3/uL  Lipid Panel w/o Chol/HDL Ratio  Result Value Ref Range   Cholesterol, Total 217 (H) 100 - 199 mg/dL   Triglycerides 271 (H) 0 - 149 mg/dL   HDL 36 (L) >39 mg/dL   VLDL Cholesterol Cal 49 (H) 5 - 40 mg/dL   LDL Chol Calc (NIH) 132 (H) 0 - 99 mg/dL  PSA  Result Value Ref Range   Prostate Specific Ag, Serum 1.0 0.0 - 4.0 ng/mL  TSH  Result Value Ref Range   TSH 2.720 0.450 - 4.500 uIU/mL  Urinalysis, Routine w reflex microscopic  Result Value Ref Range   Specific Gravity, UA 1.015 1.005 - 1.030   pH, UA 5.5 5.0 - 7.5   Color, UA Yellow Yellow   Appearance Ur Clear Clear   Leukocytes,UA Negative Negative   Protein,UA Negative Negative/Trace   Glucose, UA Negative Negative   Ketones, UA Negative Negative   RBC, UA Negative Negative   Bilirubin, UA Negative Negative   Urobilinogen, Ur 0.2 0.2 - 1.0 mg/dL   Nitrite, UA Negative Negative  Bayer DCA Hb A1c Waived  Result Value Ref Range   HB A1C (BAYER DCA - WAIVED) 7.0 (H) 4.8 - 5.6 %  Microalbumin, Urine Waived  Result Value Ref Range   Microalb, Ur Waived 10 0 - 19 mg/L   Creatinine, Urine Waived 50 10 - 300 mg/dL   Microalb/Creat Ratio <30 <30 mg/g  Uric acid  Result  Value Ref Range   Uric Acid 7.9 3.8 - 8.4 mg/dL  Hepatitis C Antibody  Result Value Ref Range   Hep C Virus Ab <0.1 0.0 - 0.9 s/co ratio        Current Outpatient Medications:    cyclobenzaprine (FLEXERIL) 10 MG tablet, TAKE 1 TABLET BY MOUTH AT BEDTIME, Disp: 30 tablet, Rfl: 0   Dulaglutide (TRULICITY) 6.21 HY/8.6VH SOPN, INJECT 0.75MG INTO THE SKIN ONCE A WEEK, Disp: 6 mL, Rfl: 1   meloxicam (MOBIC) 15 MG tablet, Take 1 tablet (15 mg total) by mouth in the morning and at bedtime for 7 days., Disp: 14 tablet, Rfl: 0   Accu-Chek Softclix Lancets lancets, SMARTSIG:Topical, Disp: , Rfl:    gabapentin (NEURONTIN) 300 MG capsule, Take 1 capsule (300 mg total) by mouth 2 (two) times daily., Disp: 180 capsule, Rfl: 1   losartan (COZAAR) 25 MG tablet, Take 1 tablet (25 mg total) by mouth daily., Disp: 90 tablet, Rfl: 1   metFORMIN (GLUCOPHAGE-XR) 750 MG 24 hr tablet, Take 2 tablets (1,500 mg total) by mouth daily with breakfast., Disp: 180 tablet, Rfl: 1   pantoprazole (PROTONIX) 40 MG tablet, Take 1 tablet (40 mg total) by mouth daily., Disp: 90 tablet, Rfl: 1   rosuvastatin (CRESTOR) 40 MG tablet, Take 1 tablet (40 mg total) by mouth daily., Disp: 90 tablet, Rfl: 1   traZODone (DESYREL) 50 MG tablet, Take 0.5-1 tablets (25-50 mg total) by mouth at bedtime as needed for sleep., Disp: 30 tablet, Rfl: 3    Assessment & Plan:  Right lower back pain ? Musculoskeletal Will start pt on mobic pt not tried otc meds says they dont work. Would like a toradol shots , doent have anyone here tho will be back for such  Is on flexeril rx for a month is already out of such per his verbal record. No refills.    Problem List Items Addressed This Visit       Nervous and Auditory   Osteoarthritis of spine with radiculopathy, lumbar region   Relevant Medications   meloxicam (MOBIC) 15 MG tablet   Lumbago with sciatica, unspecified side   Relevant Medications   meloxicam (MOBIC) 15 MG tablet     Other    Chronic right-sided back pain - Primary   Relevant Medications   meloxicam (MOBIC) 15 MG tablet     No orders of the defined types were placed in this encounter.    Meds ordered this encounter  Medications   meloxicam (MOBIC) 15 MG tablet    Sig: Take 1 tablet (15 mg total) by mouth in the morning and at bedtime for 7 days.    Dispense:  14 tablet    Refill:  0     Follow up plan: No follow-ups on file.

## 2021-08-28 ENCOUNTER — Other Ambulatory Visit: Payer: Self-pay

## 2021-08-28 ENCOUNTER — Other Ambulatory Visit: Payer: Self-pay | Admitting: Obstetrics and Gynecology

## 2021-08-28 NOTE — Patient Outreach (Signed)
Care Coordination ? ?08/28/2021 ? ?[name] [facility] ?21-Mar-1966 ?[id number] ? ?RNCM called patient at scheduled time.  Patient stated he just moved and would be better to talk tomorrow.  RNCM rescheduled appointment to tomorrow at patient request. ? ?Kathi Der RN, BSN ?[place]  [facility] ?Care Management Coordinator - Managed Medicaid High Risk ?[phone] ?  ? ? ?

## 2021-08-29 ENCOUNTER — Other Ambulatory Visit: Payer: Self-pay | Admitting: Obstetrics and Gynecology

## 2021-08-29 NOTE — Patient Outreach (Signed)
Care Coordination ? ?08/29/2021 ? ?[name] [facility] ?15-Dec-1965 ?[id number] ? ? ?Medicaid Managed Care  ? ?Unsuccessful Outreach Note ? ?08/29/2021 ?Name: Dean Clements MRN: [id number] DOB: [birth year] ? ?Referred by: Loura Pardon, MD ?Reason for referral : High Risk Managed Medicaid (Unsuccessful telephone outreach) ? ? ?An unsuccessful telephone outreach was attempted today. The patient was referred to the case management team for assistance with care management and care coordination.  ? ?Follow Up Plan: The care management team will reach out to the patient again over the next 7-14 business days.  ? ?Kathi Der RN, BSN ?  [facility] ?Care Management Coordinator - Managed Medicaid High Risk ?[phone] ?  ? ? ?

## 2021-08-29 NOTE — Patient Instructions (Signed)
Visit Information ? ?Mr. Dean Clements [facility]  - as a part of your Medicaid benefit, you are eligible for care management and care coordination services at no cost or copay. I was unable to reach you by phone today but would be happy to help you with your health related needs. Please feel free to call me at [phone] ? ?A member of the Managed Medicaid care management team will reach out to you again over the next 7-14 business days.  ? ?Kathi Der RN, BSN ?[place]  [facility] ?Care Management Coordinator - Managed Medicaid High Risk ?[phone] ?  ?

## 2021-09-05 ENCOUNTER — Other Ambulatory Visit: Payer: Self-pay | Admitting: Internal Medicine

## 2021-09-05 DIAGNOSIS — M4726 Other spondylosis with radiculopathy, lumbar region: Secondary | ICD-10-CM

## 2021-09-05 DIAGNOSIS — G8929 Other chronic pain: Secondary | ICD-10-CM

## 2021-09-06 ENCOUNTER — Other Ambulatory Visit: Payer: Medicaid Other | Admitting: Obstetrics and Gynecology

## 2021-09-06 NOTE — Telephone Encounter (Signed)
Requested medications are due for refill today.  yes ? ?Requested medications are on the active medications list.  yes ? ?Last refill. 08/02/2021 #30 0 refills ? ?Future visit scheduled.   no ? ?Notes to clinic.  Medication refill is not delegated. ? ? ? ?Requested Prescriptions  ?Pending Prescriptions Disp Refills  ? cyclobenzaprine (FLEXERIL) 10 MG tablet [Pharmacy Med Name: CYCLOBENZAPRINE HCL 10 MG TAB] 30 tablet 0  ?  Sig: TAKE 1 TABLET BY MOUTH AT BEDTIME  ?  ? Not Delegated - Analgesics:  Muscle Relaxants Failed - 09/05/2021  1:34 PM  ?  ?  Failed - This refill cannot be delegated  ?  ?  Passed - Valid encounter within last 6 months  ?  Recent Outpatient Visits   ? ?      ? 4 weeks ago Chronic right-sided back pain, unspecified back location  ? [facility] Vigg, Avanti, MD  ? 2 months ago Acute bronchitis with COPD (HCC)  ? [facility] [place], Megan P, DO  ? 8 months ago Chronic bilateral low back pain with bilateral sciatica  ? [facility] Vigg, Avanti, MD  ? 9 months ago Essential hypertension  ? [facility] Vigg, Avanti, MD  ? 9 months ago Gout, unspecified cause, unspecified chronicity, unspecified site  ? [facility] Vigg, Avanti, MD  ? ?  ?  ? ?  ?  ?  ?  ?

## 2021-09-06 NOTE — Patient Outreach (Signed)
Care Coordination ? ?09/06/2021 ? ?[name] [facility] ?December 06, 1965 ?[id number] ? ?RNCM called patient at scheduled time.  Patient answered phone and stated he was driving, asked to be called another time.  RNCM rescheduled appointment at patient request. ? ?Kathi Der RN, BSN ?[place]  [facility] ?Care Management Coordinator - Managed Medicaid High Risk ?[phone] ?  ? ? ?

## 2021-09-13 ENCOUNTER — Ambulatory Visit: Payer: Medicaid Other

## 2021-10-08 ENCOUNTER — Other Ambulatory Visit: Payer: Self-pay | Admitting: Family Medicine

## 2021-10-09 NOTE — Telephone Encounter (Signed)
Requested Prescriptions  ?Pending Prescriptions Disp Refills  ?? traZODone (DESYREL) 50 MG tablet [Pharmacy Med Name: TRAZODONE HCL 50 MG TAB] 30 tablet 3  ?  Sig: TAKE 1/2-1 TABLET BY MOUTH AT BEDTIME ASNEEDED FOR SLEEP  ?  ? Psychiatry: Antidepressants - Serotonin Modulator Passed - 10/08/2021  3:26 PM  ?  ?  Passed - Valid encounter within last 6 months  ?  Recent Outpatient Visits   ?      ? 2 months ago Chronic right-sided back pain, unspecified back location  ? [facility] Vigg, Avanti, MD  ? 3 months ago Acute bronchitis with COPD (HCC)  ? [facility] Olean, Megan P, DO  ? 9 months ago Chronic bilateral low back pain with bilateral sciatica  ? [facility] Vigg, Avanti, MD  ? 10 months ago Essential hypertension  ? [facility] Vigg, Avanti, MD  ? 10 months ago Gout, unspecified cause, unspecified chronicity, unspecified site  ? [facility] Vigg, Avanti, MD  ?  ?  ? ?  ?  ?  ? ?

## 2021-10-14 ENCOUNTER — Other Ambulatory Visit: Payer: Self-pay | Admitting: Obstetrics and Gynecology

## 2021-10-14 NOTE — Patient Outreach (Signed)
Care Coordination ? ?10/14/2021 ? ?[name] [facility] ?23-Jun-1965 ?[id number] ? ? ?Medicaid Managed Care  ? ?Unsuccessful Outreach Note ? ?10/14/2021 ?Name: Dean Clements MRN: [id number] DOB: 1966-04-13 ? ?Referred by: Charlynne Cousins, MD ?Reason for referral : High Risk Managed Medicaid (Unsuccessful telephone outreach) ? ? ?An unsuccessful telephone outreach was attempted today. The patient was referred to the case management team for assistance with care management and care coordination.  ? ?Follow Up Plan: The care management team will reach out to the patient again over the next 30 business  days.  ? ?Aida Raider RN, BSN ?[facility] ?Care Management Coordinator - Managed Medicaid High Risk ?[phone] ?  ? ? ?

## 2021-10-14 NOTE — Patient Instructions (Signed)
Visit Information ? ?Mr. [name] [facility]  - as a part of your Medicaid benefit, you are eligible for care management and care coordination services at no cost or copay. I was unable to reach you by phone today but would be happy to help you with your health related needs. Please feel free to call me at [phone]. ? ?A member of the Managed Medicaid care management team will reach out to you again over the next 30 business days.  ? ?Kathi Der RN, BSN ?[place]  [facility] ?Care Management Coordinator - Managed Medicaid High Risk ?[phone] ?  ?

## 2021-10-15 ENCOUNTER — Ambulatory Visit: Payer: Medicaid Other | Admitting: Internal Medicine

## 2021-10-16 ENCOUNTER — Ambulatory Visit (INDEPENDENT_AMBULATORY_CARE_PROVIDER_SITE_OTHER): Payer: Medicaid Other | Admitting: Internal Medicine

## 2021-10-16 ENCOUNTER — Encounter: Payer: Self-pay | Admitting: Internal Medicine

## 2021-10-16 VITALS — BP 134/79 | HR 81 | Temp 98.7°F | Ht 70.28 in | Wt 229.2 lb

## 2021-10-16 DIAGNOSIS — R062 Wheezing: Secondary | ICD-10-CM

## 2021-10-16 DIAGNOSIS — R053 Chronic cough: Secondary | ICD-10-CM

## 2021-10-16 DIAGNOSIS — J440 Chronic obstructive pulmonary disease with acute lower respiratory infection: Secondary | ICD-10-CM

## 2021-10-16 DIAGNOSIS — J209 Acute bronchitis, unspecified: Secondary | ICD-10-CM | POA: Diagnosis not present

## 2021-10-16 MED ORDER — METHYLPREDNISOLONE 4 MG PO TBPK: ORAL_TABLET | ORAL | 1 refills | Status: DC

## 2021-10-16 MED ORDER — METHYLPREDNISOLONE SODIUM SUCC 40 MG IJ SOLR
120.0000 mg | Freq: Once | INTRAMUSCULAR | Status: AC
  Administered 2021-10-16: 120 mg via INTRAMUSCULAR

## 2021-10-16 MED ORDER — ALBUTEROL SULFATE HFA 108 (90 BASE) MCG/ACT IN AERS: 2.0000 | INHALATION_SPRAY | Freq: Four times a day (QID) | RESPIRATORY_TRACT | 0 refills | Status: DC | PRN

## 2021-10-16 MED ORDER — FEXOFENADINE HCL 180 MG PO TABS: 180.0000 mg | ORAL_TABLET | Freq: Every day | ORAL | 1 refills | Status: DC

## 2021-10-16 MED ORDER — SPIRIVA RESPIMAT 2.5 MCG/ACT IN AERS: 2.0000 | INHALATION_SPRAY | Freq: Every day | RESPIRATORY_TRACT | 2 refills | Status: DC

## 2021-10-16 MED ORDER — AZITHROMYCIN 250 MG PO TABS: ORAL_TABLET | ORAL | 0 refills | Status: AC

## 2021-10-16 MED ORDER — IPRATROPIUM-ALBUTEROL 0.5-2.5 (3) MG/3ML IN SOLN
3.0000 mL | Freq: Once | RESPIRATORY_TRACT | Status: AC
  Administered 2021-10-16: 3 mL via RESPIRATORY_TRACT

## 2021-10-16 NOTE — Progress Notes (Signed)
? ?BP 134/79   Pulse 81   Temp 98.7 ?F (37.1 ?C) (Oral)   Ht 5' 10.28" (1.785 m)   Wt 229 lb 3.2 oz (104 kg)   SpO2 94%   BMI 32.63 kg/m?   ? ?Subjective:  ? ? Patient ID: Dean Clements, male    DOB: [birth year], 56 y.o.   MRN: [id number] ? ?Chief Complaint  ?Patient presents with  ? Cough  ?  Started about 1 1/2 months ago.   ? ? ?HPI: ?Dean Clements is a 56 y.o. male ? ?Cough ? ? ?Chief Complaint  ?Patient presents with  ? Cough  ?  Started about 1 1/2 months ago.   ? ? ?Relevant past medical, surgical, family and social history reviewed and updated as indicated. Interim medical history since our last visit reviewed. ?Allergies and medications reviewed and updated. ? ?Review of Systems  ?Respiratory:  Positive for cough.   ? ?Per HPI unless specifically indicated above ? ?   ?Objective:  ?  ?BP 134/79   Pulse 81   Temp 98.7 ?F (37.1 ?C) (Oral)   Ht 5' 10.28" (1.785 m)   Wt 229 lb 3.2 oz (104 kg)   SpO2 94%   BMI 32.63 kg/m?   ?Wt Readings from Last 3 Encounters:  ?10/16/21 229 lb 3.2 oz (104 kg)  ?08/08/21 265 lb 8 oz (120.4 kg)  ?07/04/21 256 lb (116.1 kg)  ?  ?Physical Exam ?Vitals and nursing note reviewed.  ?Constitutional:   ?   General: He is not in acute distress. ?   Appearance: Normal appearance. He is not ill-appearing or diaphoretic.  ?HENT:  ?   Head: Normocephalic and atraumatic.  ?   Right Ear: Tympanic membrane and external ear normal. There is no impacted cerumen.  ?   Left Ear: External ear normal.  ?   Nose: No congestion or rhinorrhea.  ?   Mouth/Throat:  ?   Pharynx: No oropharyngeal exudate or posterior oropharyngeal erythema.  ?Eyes:  ?   Conjunctiva/sclera: Conjunctivae normal.  ?   Pupils: Pupils are equal, round, and reactive to light.  ?Cardiovascular:  ?   Rate and Rhythm: Normal rate and regular rhythm.  ?   Heart sounds: No murmur heard. ?  No friction rub. No gallop.  ?Pulmonary:  ?   Effort: No respiratory distress.  ?   Breath sounds: No stridor.  Wheezing present. No rhonchi.  ?Chest:  ?   Chest wall: No tenderness.  ?Abdominal:  ?   General: Abdomen is flat. Bowel sounds are normal.  ?   Palpations: There is no mass.  ?   Tenderness: There is no abdominal tenderness.  ?Musculoskeletal:     ?   General: No swelling, tenderness or deformity.  ?   Left lower leg: No edema.  ?Skin: ?   General: Skin is warm and dry.  ?   Coloration: Skin is not jaundiced or pale.  ?Neurological:  ?   Mental Status: He is alert.  ?Psychiatric:     ?   Mood and Affect: Mood normal.     ?   Behavior: Behavior normal.     ?   Thought Content: Thought content normal.     ?   Judgment: Judgment normal.  ? ? ?Results for orders placed or performed in visit on 07/04/21  ?Comprehensive metabolic panel  ?Result Value Ref Range  ? Glucose 207 (H) 70 - 99 mg/dL  ? BUN 9 6 -  24 mg/dL  ? Creatinine, Ser 0.98 0.76 - 1.27 mg/dL  ? eGFR 91 >59 mL/min/1.73  ? BUN/Creatinine Ratio 9 9 - 20  ? Sodium 138 134 - 144 mmol/L  ? Potassium 4.1 3.5 - 5.2 mmol/L  ? Chloride 98 96 - 106 mmol/L  ? CO2 26 20 - 29 mmol/L  ? Calcium 8.9 8.7 - 10.2 mg/dL  ? Total Protein 5.6 (L) 6.0 - 8.5 g/dL  ? Albumin 3.9 3.8 - 4.9 g/dL  ? Globulin, Total 1.7 1.5 - 4.5 g/dL  ? Albumin/Globulin Ratio 2.3 (H) 1.2 - 2.2  ? Bilirubin Total 0.5 0.0 - 1.2 mg/dL  ? Alkaline Phosphatase 68 44 - 121 IU/L  ? AST 18 0 - 40 IU/L  ? ALT 17 0 - 44 IU/L  ?CBC with Differential/Platelet  ?Result Value Ref Range  ? WBC 7.6 3.4 - 10.8 x10E3/uL  ? RBC 5.30 4.14 - 5.80 x10E6/uL  ? Hemoglobin 16.0 13.0 - 17.7 g/dL  ? Hematocrit 47.2 37.5 - 51.0 %  ? MCV 89 79 - 97 fL  ? MCH 30.2 26.6 - 33.0 pg  ? MCHC 33.9 31.5 - 35.7 g/dL  ? RDW 12.5 11.6 - 15.4 %  ? Platelets 235 150 - 450 x10E3/uL  ? Neutrophils 65 Not Estab. %  ? Lymphs 19 Not Estab. %  ? Monocytes 8 Not Estab. %  ? Eos 7 Not Estab. %  ? Basos 1 Not Estab. %  ? Neutrophils Absolute 5.0 1.4 - 7.0 x10E3/uL  ? Lymphocytes Absolute 1.5 0.7 - 3.1 x10E3/uL  ? Monocytes Absolute 0.6 0.1 - 0.9  x10E3/uL  ? EOS (ABSOLUTE) 0.5 (H) 0.0 - 0.4 x10E3/uL  ? Basophils Absolute 0.1 0.0 - 0.2 x10E3/uL  ? Immature Granulocytes 0 Not Estab. %  ? Immature Grans (Abs) 0.0 0.0 - 0.1 x10E3/uL  ?Lipid Panel w/o Chol/HDL Ratio  ?Result Value Ref Range  ? Cholesterol, Total 217 (H) 100 - 199 mg/dL  ? Triglycerides 271 (H) 0 - 149 mg/dL  ? HDL 36 (L) >39 mg/dL  ? VLDL Cholesterol Cal 49 (H) 5 - 40 mg/dL  ? LDL Chol Calc (NIH) 132 (H) 0 - 99 mg/dL  ?PSA  ?Result Value Ref Range  ? Prostate Specific Ag, Serum 1.0 0.0 - 4.0 ng/mL  ?TSH  ?Result Value Ref Range  ? TSH 2.720 0.450 - 4.500 uIU/mL  ?Urinalysis, Routine w reflex microscopic  ?Result Value Ref Range  ? Specific Gravity, UA 1.015 1.005 - 1.030  ? pH, UA 5.5 5.0 - 7.5  ? Color, UA Yellow Yellow  ? Appearance Ur Clear Clear  ? Leukocytes,UA Negative Negative  ? Protein,UA Negative Negative/Trace  ? Glucose, UA Negative Negative  ? Ketones, UA Negative Negative  ? RBC, UA Negative Negative  ? Bilirubin, UA Negative Negative  ? Urobilinogen, Ur 0.2 0.2 - 1.0 mg/dL  ? Nitrite, UA Negative Negative  ?Bayer DCA Hb A1c Waived  ?Result Value Ref Range  ? HB A1C (BAYER DCA - WAIVED) 7.0 (H) 4.8 - 5.6 %  ?Microalbumin, Urine Waived  ?Result Value Ref Range  ? Microalb, Ur Waived 10 0 - 19 mg/L  ? Creatinine, Urine Waived 50 10 - 300 mg/dL  ? Microalb/Creat Ratio <30 <30 mg/g  ?Uric acid  ?Result Value Ref Range  ? Uric Acid 7.9 3.8 - 8.4 mg/dL  ?Hepatitis C Antibody  ?Result Value Ref Range  ? Hep C Virus Ab <0.1 0.0 - 0.9 s/co ratio  ? ?   ? ? ?  Current Outpatient Medications:  ?  Accu-Chek Softclix Lancets lancets, SMARTSIG:Topical, Disp: , Rfl:  ?  albuterol (VENTOLIN HFA) 108 (90 Base) MCG/ACT inhaler, Inhale 2 puffs into the lungs every 6 (six) hours as needed for wheezing or shortness of breath., Disp: 8 g, Rfl: 0 ?  azithromycin (ZITHROMAX) 250 MG tablet, Take 2 tablets on day 1, then 1 tablet daily on days 2 through 5, Disp: 6 tablet, Rfl: 0 ?  cyclobenzaprine (FLEXERIL)  10 MG tablet, Take 1 tablet (10 mg total) by mouth at bedtime., Disp: 15 tablet, Rfl: 0 ?  Dulaglutide (TRULICITY) 4.07 WK/0.8UP SOPN, INJECT 0.75MG INTO THE SKIN ONCE A WEEK, Disp: 6 mL, Rfl: 1 ?  fexofenadine (ALLEGRA ALLERGY) 180 MG tablet, Take 1 tablet (180 mg total) by mouth daily., Disp: 10 tablet, Rfl: 1 ?  gabapentin (NEURONTIN) 300 MG capsule, Take 1 capsule (300 mg total) by mouth 2 (two) times daily., Disp: 180 capsule, Rfl: 1 ?  losartan (COZAAR) 25 MG tablet, Take 1 tablet (25 mg total) by mouth daily., Disp: 90 tablet, Rfl: 1 ?  methylPREDNISolone (MEDROL DOSEPAK) 4 MG TBPK tablet, Use as directed, Disp: 1 each, Rfl: 1 ?  pantoprazole (PROTONIX) 40 MG tablet, Take 1 tablet (40 mg total) by mouth daily., Disp: 90 tablet, Rfl: 1 ?  rosuvastatin (CRESTOR) 40 MG tablet, Take 1 tablet (40 mg total) by mouth daily., Disp: 90 tablet, Rfl: 1 ?  Tiotropium Bromide Monohydrate (SPIRIVA RESPIMAT) 2.5 MCG/ACT AERS, Inhale 2 puffs into the lungs daily., Disp: 1 each, Rfl: 2 ?  traZODone (DESYREL) 50 MG tablet, TAKE 1/2-1 TABLET BY MOUTH AT BEDTIME ASNEEDED FOR SLEEP, Disp: 30 tablet, Rfl: 3 ?  metFORMIN (GLUCOPHAGE-XR) 750 MG 24 hr tablet, Take 2 tablets (1,500 mg total) by mouth daily with breakfast., Disp: 180 tablet, Rfl: 1  ? ? ?Assessment & Plan:  ?Wheeizng ? Acute exacerbation of possibly COPD  ?Will need to check PFTs undiagnosed up until now. ?Given patient has a long history of smoking about a pack to pack and a half a day.  ?DuoNebs given x1.  Solu-Medrol 120 mg administered IM today. ?Will start patient on Spiriva inhaled once a day for presumed COPD as well as albuterol as rescue inhaler. ?Will refer to pulmonology for PFTs. ? ?Problem List Items Addressed This Visit   ? ?  ? Respiratory  ? Acute bronchitis with COPD ([place])  ? Relevant Medications  ? fexofenadine (ALLEGRA ALLERGY) 180 MG tablet  ? albuterol (VENTOLIN HFA) 108 (90 Base) MCG/ACT inhaler  ? Tiotropium Bromide Monohydrate (SPIRIVA RESPIMAT)  2.5 MCG/ACT AERS  ? methylPREDNISolone (MEDROL DOSEPAK) 4 MG TBPK tablet  ? azithromycin (ZITHROMAX) 250 MG tablet  ?  ? Other  ? Chronic cough - Primary  ? Relevant Orders  ? DG Chest 2 View  ? Ambulatory referral to Pul

## 2021-10-17 ENCOUNTER — Telehealth: Payer: Self-pay

## 2021-10-17 NOTE — Telephone Encounter (Signed)
PA started for Allegra through Covermy meds. Awaiting on determination ? ?

## 2021-11-05 ENCOUNTER — Other Ambulatory Visit: Payer: Self-pay | Admitting: Internal Medicine

## 2021-11-05 ENCOUNTER — Ambulatory Visit
Admission: RE | Admit: 2021-11-05 | Discharge: 2021-11-05 | Disposition: A | Discharge status: Home / Self Care | Payer: Medicaid Other | Source: Ambulatory Visit | Attending: Internal Medicine | Admitting: Internal Medicine

## 2021-11-05 ENCOUNTER — Ambulatory Visit
Admission: RE | Admit: 2021-11-05 | Discharge: 2021-11-05 | Disposition: A | Discharge status: Home / Self Care | Payer: Medicaid Other | Attending: Internal Medicine | Admitting: Internal Medicine

## 2021-11-05 DIAGNOSIS — R053 Chronic cough: Secondary | ICD-10-CM | POA: Insufficient documentation

## 2021-11-05 NOTE — Telephone Encounter (Unsigned)
Medication Refill - Medication: albuterol (VENTOLIN HFA) 108 (90 Base) MCG/ACT inhaler  Has the patient contacted their pharmacy? Yes.   Pt states he has lost his inhaler. Has looked everywhere! Knows he will have to pay out of pocket if approved.  Pt states he really needs everyday!  Preferred Pharmacy (with phone number or street name): [facility] [place], [address] Has the patient been seen for an appointment in the last year OR does the patient have an upcoming appointment? Yes.    Agent: Please be advised that RX refills may take up to 3 business days. We ask that you follow-up with your pharmacy.

## 2021-11-06 ENCOUNTER — Ambulatory Visit: Payer: Medicaid Other | Admitting: Internal Medicine

## 2021-11-06 ENCOUNTER — Encounter: Payer: Self-pay | Admitting: Internal Medicine

## 2021-11-06 VITALS — BP 125/79 | HR 98 | Temp 98.4°F | Ht 64.49 in | Wt 220.8 lb

## 2021-11-06 DIAGNOSIS — M79672 Pain in left foot: Secondary | ICD-10-CM

## 2021-11-06 DIAGNOSIS — M79671 Pain in right foot: Secondary | ICD-10-CM

## 2021-11-06 DIAGNOSIS — M1A4720 Other secondary chronic gout, left ankle and foot, without tophus (tophi): Secondary | ICD-10-CM | POA: Diagnosis not present

## 2021-11-06 DIAGNOSIS — E119 Type 2 diabetes mellitus without complications: Secondary | ICD-10-CM | POA: Diagnosis not present

## 2021-11-06 DIAGNOSIS — J449 Chronic obstructive pulmonary disease, unspecified: Secondary | ICD-10-CM

## 2021-11-06 LAB — BAYER DCA HB A1C WAIVED: HB A1C (BAYER DCA - WAIVED): 6.9 % — ABNORMAL HIGH (ref 4.8–5.6)

## 2021-11-06 MED ORDER — ALBUTEROL SULFATE HFA 108 (90 BASE) MCG/ACT IN AERS: 2.0000 | INHALATION_SPRAY | Freq: Four times a day (QID) | RESPIRATORY_TRACT | 3 refills | Status: AC | PRN

## 2021-11-06 MED ORDER — ALBUTEROL SULFATE HFA 108 (90 BASE) MCG/ACT IN AERS: 2.0000 | INHALATION_SPRAY | Freq: Four times a day (QID) | RESPIRATORY_TRACT | 1 refills | Status: DC | PRN

## 2021-11-06 MED ORDER — FLUTICASONE FUROATE-VILANTEROL 100-25 MCG/ACT IN AEPB: 1.0000 | INHALATION_SPRAY | Freq: Every day | RESPIRATORY_TRACT | 1 refills | Status: DC

## 2021-11-06 NOTE — Telephone Encounter (Signed)
Requested medication (s) are due for refill today:   Pt lost his inhaler.   Needs another one.   Knows he may have to pay out of pocket.   He has an appt with Dr. Charlotta Newton today at 2:40.  Requested medication (s) are on the active medication list:   Yes  Future visit scheduled:   Yes today at 2:40 Dr. Charlotta Newton   Last ordered: 11/05/2021 18 g, 0 refills  Returned because he lost his inhaler and needs another one.   Has appt today    Requested Prescriptions  Pending Prescriptions Disp Refills   albuterol (VENTOLIN HFA) 108 (90 Base) MCG/ACT inhaler 8 g 0    Sig: Inhale 2 puffs into the lungs every 6 (six) hours as needed for wheezing or shortness of breath.     Pulmonology:  [facility] 2 Passed - 11/05/2021  4:40 PM      Passed - Last BP in normal range    BP Readings from Last 1 Encounters:  10/16/21 134/79         Passed - Last Heart Rate in normal range    Pulse Readings from Last 1 Encounters:  10/16/21 81         Passed - Valid encounter within last 12 months    Recent Outpatient Visits           3 weeks ago Chronic cough   [facility] Vigg, Avanti, MD   3 months ago Chronic right-sided back pain, unspecified back location   [facility] Vigg, Avanti, MD   4 months ago Acute bronchitis with COPD (HCC)   [facility] Johnson, Megan P, DO   10 months ago Chronic bilateral low back pain with bilateral sciatica   [facility] Vigg, Avanti, MD   11 months ago Essential hypertension   [facility] Vigg, Avanti, MD       Future Appointments             Today Vigg, Avanti, MD [facility]

## 2021-11-06 NOTE — Patient Instructions (Signed)

## 2021-11-06 NOTE — Telephone Encounter (Signed)
Requested medication (s) are due for refill today: Early  Requested medication (s) are on the active medication list: Yes  Last refill:  10/16/21  Future visit scheduled: Yes  Notes to clinic:  See request.    Requested Prescriptions  Pending Prescriptions Disp Refills   VENTOLIN HFA 108 (90 Base) MCG/ACT inhaler [Pharmacy Med Name: VENTOLIN HFA 108 (90 BASE) MCG/ACT] 18 g     Sig: INHALE 2 PUFFS INTO THE LUNGS EVERY 6 HOURS AS NEEDED FOR WHEEZING OR SHORTNESS OF BREATH     Pulmonology:  [facility] 2 Passed - 11/05/2021  3:25 PM      Passed - Last BP in normal range    BP Readings from Last 1 Encounters:  10/16/21 134/79         Passed - Last Heart Rate in normal range    Pulse Readings from Last 1 Encounters:  10/16/21 81         Passed - Valid encounter within last 12 months    Recent Outpatient Visits           3 weeks ago Chronic cough   [facility] Vigg, Avanti, MD   3 months ago Chronic right-sided back pain, unspecified back location   [facility] Vigg, Avanti, MD   4 months ago Acute bronchitis with COPD (HCC)   [facility] Johnson, Megan P, DO   10 months ago Chronic bilateral low back pain with bilateral sciatica   [facility] Vigg, Avanti, MD   11 months ago Essential hypertension   [facility] Vigg, Avanti, MD       Future Appointments             Today Vigg, Avanti, MD [facility]

## 2021-11-06 NOTE — Progress Notes (Signed)
 BP 125/79   Pulse 98   Temp 98.4 F (36.9 C) (Oral)   Ht 5' 4.49" (1.638 m)   Wt 220 lb 12.8 oz (100.2 kg)   SpO2 94%   BMI 37.33 kg/m    Subjective:    Patient ID: Dean Clements, male    DOB: 10/10/1965, 55 y.o.   MRN: [id number]  Chief Complaint  Patient presents with   Cough    F/U cough is doing ok but still has the cough.     HPI: Dean Clements is a 55 y.o. male  Foot pain x same spot on top of bil feet x 3 days ago. Both were red and had swelling but improved   Cough This is a chronic (is better is on albuterol and spiriva for such) problem. The current episode started more than 1 year ago. The problem has been gradually improving. Associated symptoms include shortness of breath and wheezing. Pertinent negatives include no chest pain, chills, ear congestion, myalgias, nasal congestion, postnasal drip, rash, rhinorrhea, sore throat, sweats or weight loss.   Chief Complaint  Patient presents with   Cough    F/U cough is doing ok but still has the cough.     Relevant past medical, surgical, family and social history reviewed and updated as indicated. Interim medical history since our last visit reviewed. Allergies and medications reviewed and updated.  Review of Systems  Constitutional:  Negative for chills and weight loss.  HENT:  Negative for postnasal drip, rhinorrhea and sore throat.   Respiratory:  Positive for cough, shortness of breath and wheezing.   Cardiovascular:  Negative for chest pain.  Musculoskeletal:  Negative for myalgias.  Skin:  Negative for rash.   Per HPI unless specifically indicated above     Objective:    BP 125/79   Pulse 98   Temp 98.4 F (36.9 C) (Oral)   Ht 5' 4.49" (1.638 m)   Wt 220 lb 12.8 oz (100.2 kg)   SpO2 94%   BMI 37.33 kg/m   Wt Readings from Last 3 Encounters:  11/06/21 220 lb 12.8 oz (100.2 kg)  10/16/21 229 lb 3.2 oz (104 kg)  08/08/21 265 lb 8 oz (120.4 kg)    Physical Exam  Results  for orders placed or performed in visit on 07/04/21  Comprehensive metabolic panel  Result Value Ref Range   Glucose 207 (H) 70 - 99 mg/dL   BUN 9 6 - 24 mg/dL   Creatinine, Ser 0.98 0.76 - 1.27 mg/dL   eGFR 91 >59 mL/min/1.73   BUN/Creatinine Ratio 9 9 - 20   Sodium 138 134 - 144 mmol/L   Potassium 4.1 3.5 - 5.2 mmol/L   Chloride 98 96 - 106 mmol/L   CO2 26 20 - 29 mmol/L   Calcium 8.9 8.7 - 10.2 mg/dL   Total Protein 5.6 (L) 6.0 - 8.5 g/dL   Albumin 3.9 3.8 - 4.9 g/dL   Globulin, Total 1.7 1.5 - 4.5 g/dL   Albumin/Globulin Ratio 2.3 (H) 1.2 - 2.2   Bilirubin Total 0.5 0.0 - 1.2 mg/dL   Alkaline Phosphatase 68 44 - 121 IU/L   AST 18 0 - 40 IU/L   ALT 17 0 - 44 IU/L  CBC with Differential/Platelet  Result Value Ref Range   WBC 7.6 3.4 - 10.8 x10E3/uL   RBC 5.30 4.14 - 5.80 x10E6/uL   Hemoglobin 16.0 13.0 - 17.7 g/dL   Hematocrit 47.2 37.5 - 51.0 %     MCV 89 79 - 97 fL   MCH 30.2 26.6 - 33.0 pg   MCHC 33.9 31.5 - 35.7 g/dL   RDW 12.5 11.6 - 15.4 %   Platelets 235 150 - 450 x10E3/uL   Neutrophils 65 Not Estab. %   Lymphs 19 Not Estab. %   Monocytes 8 Not Estab. %   Eos 7 Not Estab. %   Basos 1 Not Estab. %   Neutrophils Absolute 5.0 1.4 - 7.0 x10E3/uL   Lymphocytes Absolute 1.5 0.7 - 3.1 x10E3/uL   Monocytes Absolute 0.6 0.1 - 0.9 x10E3/uL   EOS (ABSOLUTE) 0.5 (H) 0.0 - 0.4 x10E3/uL   Basophils Absolute 0.1 0.0 - 0.2 x10E3/uL   Immature Granulocytes 0 Not Estab. %   Immature Grans (Abs) 0.0 0.0 - 0.1 x10E3/uL  Lipid Panel w/o Chol/HDL Ratio  Result Value Ref Range   Cholesterol, Total 217 (H) 100 - 199 mg/dL   Triglycerides 271 (H) 0 - 149 mg/dL   HDL 36 (L) >39 mg/dL   VLDL Cholesterol Cal 49 (H) 5 - 40 mg/dL   LDL Chol Calc (NIH) 132 (H) 0 - 99 mg/dL  PSA  Result Value Ref Range   Prostate Specific Ag, Serum 1.0 0.0 - 4.0 ng/mL  TSH  Result Value Ref Range   TSH 2.720 0.450 - 4.500 uIU/mL  Urinalysis, Routine w reflex microscopic  Result Value Ref Range    Specific Gravity, UA 1.015 1.005 - 1.030   pH, UA 5.5 5.0 - 7.5   Color, UA Yellow Yellow   Appearance Ur Clear Clear   Leukocytes,UA Negative Negative   Protein,UA Negative Negative/Trace   Glucose, UA Negative Negative   Ketones, UA Negative Negative   RBC, UA Negative Negative   Bilirubin, UA Negative Negative   Urobilinogen, Ur 0.2 0.2 - 1.0 mg/dL   Nitrite, UA Negative Negative  Bayer DCA Hb A1c Waived  Result Value Ref Range   HB A1C (BAYER DCA - WAIVED) 7.0 (H) 4.8 - 5.6 %  Microalbumin, Urine Waived  Result Value Ref Range   Microalb, Ur Waived 10 0 - 19 mg/L   Creatinine, Urine Waived 50 10 - 300 mg/dL   Microalb/Creat Ratio <30 <30 mg/g  Uric acid  Result Value Ref Range   Uric Acid 7.9 3.8 - 8.4 mg/dL  Hepatitis C Antibody  Result Value Ref Range   Hep C Virus Ab <0.1 0.0 - 0.9 s/co ratio        Current Outpatient Medications:    Accu-Chek Softclix Lancets lancets, SMARTSIG:Topical, Disp: , Rfl:    cyclobenzaprine (FLEXERIL) 10 MG tablet, Take 1 tablet (10 mg total) by mouth at bedtime., Disp: 15 tablet, Rfl: 0   Dulaglutide (TRULICITY) 6.75 FF/6.3WG SOPN, INJECT 0.75MG INTO THE SKIN ONCE A WEEK, Disp: 6 mL, Rfl: 1   fexofenadine (ALLEGRA ALLERGY) 180 MG tablet, Take 1 tablet (180 mg total) by mouth daily., Disp: 10 tablet, Rfl: 1   fluticasone furoate-vilanterol (BREO ELLIPTA) 100-25 MCG/ACT AEPB, Inhale 1 puff into the lungs daily., Disp: 1 each, Rfl: 1   gabapentin (NEURONTIN) 300 MG capsule, Take 1 capsule (300 mg total) by mouth 2 (two) times daily., Disp: 180 capsule, Rfl: 1   losartan (COZAAR) 25 MG tablet, Take 1 tablet (25 mg total) by mouth daily., Disp: 90 tablet, Rfl: 1   pantoprazole (PROTONIX) 40 MG tablet, Take 1 tablet (40 mg total) by mouth daily., Disp: 90 tablet, Rfl: 1   rosuvastatin (CRESTOR) 40 MG tablet, Take  1 tablet (40 mg total) by mouth daily., Disp: 90 tablet, Rfl: 1   Tiotropium Bromide Monohydrate (SPIRIVA RESPIMAT) 2.5 MCG/ACT AERS,  Inhale 2 puffs into the lungs daily., Disp: 1 each, Rfl: 2   traZODone (DESYREL) 50 MG tablet, TAKE 1/2-1 TABLET BY MOUTH AT BEDTIME ASNEEDED FOR SLEEP, Disp: 30 tablet, Rfl: 3   albuterol (VENTOLIN HFA) 108 (90 Base) MCG/ACT inhaler, Inhale 2 puffs into the lungs every 6 (six) hours as needed for wheezing or shortness of breath., Disp: 8 g, Rfl: 3   metFORMIN (GLUCOPHAGE-XR) 750 MG 24 hr tablet, Take 2 tablets (1,500 mg total) by mouth daily with breakfast., Disp: 180 tablet, Rfl: 1    Assessment & Plan:  COPD needs PFTs will rfeer to pulm  Smokes marijuana.  Gout will check uric acid.  not acutely inflammed  Consider will check URIC acid levels today - Consider allopurinol for such   Diabetes Non compliant with checking sugars at home Last a1c better from 9 -- 7 Recheck today check HbA1c,  urine  microalbumin  diabetic diet plan given to pt  adviced regarding hypoglycemia and instructions given to pt today on how to prevent and treat the same if it were to occur. pt acknowledges the plan and voices understanding of the same.  exercise plan given and encouraged.   advice diabetic yearly podiatry, ophthalmology , nutritionist , dental check q 6 months,     Problem List Items Addressed This Visit       Respiratory   Chronic obstructive pulmonary disease ([place]) - Primary   Relevant Medications   albuterol (VENTOLIN HFA) 108 (90 Base) MCG/ACT inhaler   fluticasone furoate-vilanterol (BREO ELLIPTA) 100-25 MCG/ACT AEPB   Other Relevant Orders   Ambulatory referral to Pulmonology     Other   Gout   Pain in both feet   Relevant Orders   DG Foot Complete Left   DG Foot Complete Right   Uric acid   Comprehensive metabolic panel   Other Visit Diagnoses     Diabetes mellitus without complication ([place])       Relevant Orders   Bayer DCA Hb A1c Waived (STAT)        Orders Placed This Encounter  Procedures   DG Foot Complete Left   DG Foot Complete Right   Uric acid    Comprehensive metabolic panel   Bayer DCA Hb A1c Waived (STAT)   Ambulatory referral to Pulmonology     Meds ordered this encounter  Medications   albuterol (VENTOLIN HFA) 108 (90 Base) MCG/ACT inhaler    Sig: Inhale 2 puffs into the lungs every 6 (six) hours as needed for wheezing or shortness of breath.    Dispense:  8 g    Refill:  3   fluticasone furoate-vilanterol (BREO ELLIPTA) 100-25 MCG/ACT AEPB    Sig: Inhale 1 puff into the lungs daily.    Dispense:  1 each    Refill:  1     Follow up plan: Return in about 6 weeks (around 12/18/2021).

## 2021-11-07 ENCOUNTER — Telehealth: Payer: Self-pay

## 2021-11-07 LAB — COMPREHENSIVE METABOLIC PANEL
ALT: 23 IU/L (ref 0–44)
AST: 22 IU/L (ref 0–40)
Albumin/Globulin Ratio: 1.6 (ref 1.2–2.2)
Albumin: 4.1 g/dL (ref 3.8–4.9)
Alkaline Phosphatase: 76 IU/L (ref 44–121)
BUN/Creatinine Ratio: 7 — ABNORMAL LOW (ref 9–20)
BUN: 6 mg/dL (ref 6–24)
Bilirubin Total: 0.3 mg/dL (ref 0.0–1.2)
CO2: 24 mmol/L (ref 20–29)
Calcium: 8.9 mg/dL (ref 8.7–10.2)
Chloride: 103 mmol/L (ref 96–106)
Creatinine, Ser: 0.87 mg/dL (ref 0.76–1.27)
Globulin, Total: 2.5 g/dL (ref 1.5–4.5)
Glucose: 131 mg/dL — ABNORMAL HIGH (ref 70–99)
Potassium: 4.3 mmol/L (ref 3.5–5.2)
Sodium: 141 mmol/L (ref 134–144)
Total Protein: 6.6 g/dL (ref 6.0–8.5)
eGFR: 102 mL/min/{1.73_m2} (ref >59)

## 2021-11-07 LAB — URIC ACID: Uric Acid: 5.8 mg/dL (ref 3.8–8.4)

## 2021-11-07 NOTE — Progress Notes (Signed)
Pl let him know his uric acid levels were wnl, a1c well controlled, needs to continue current meds and to call office if anything changes.

## 2021-11-07 NOTE — Progress Notes (Signed)
Please let pt know this was normal.

## 2021-11-07 NOTE — Telephone Encounter (Signed)
PA started for Breo through Covermy meds. Awaiting on determination

## 2021-11-07 NOTE — Telephone Encounter (Signed)
Pa for Virgel Bouquet has been denied

## 2021-11-13 ENCOUNTER — Other Ambulatory Visit: Payer: Self-pay | Admitting: Obstetrics and Gynecology

## 2021-11-13 NOTE — Patient Outreach (Signed)
Care Coordination  11/13/2021  Dean Clements [facility] 1966-01-16 [id number]   Medicaid Managed Care   Unsuccessful Outreach Note  11/13/2021 Name: Dean Clements MRN: [id number] DOB: 1965-07-08  Referred by: Loura Pardon, MD Reason for referral : High Risk Managed Medicaid (Unsuccessful telephone outreach)   A second unsuccessful telephone outreach was attempted today. The patient was referred to the case management team for assistance with care management and care coordination.   Follow Up Plan: The care management team will reach out to the patient again over the next 30 business  days.   Kathi Der RN, BSN [place]  [employer] - Managed Medicaid High Risk [phone]

## 2021-11-13 NOTE — Patient Instructions (Signed)
Visit Information  Mr. Dean Clements [facility]  - as a part of your Medicaid benefit, you are eligible for care management and care coordination services at no cost or copay. I was unable to reach you by phone today but would be happy to help you with your health related needs. Please feel free to call me at [phone].  A member of the Managed Medicaid care management team will reach out to you again over the next 30 business days.   Kathi Der RN, BSN [place]  [employer] - Managed Medicaid High Risk [phone]

## 2021-12-13 ENCOUNTER — Other Ambulatory Visit: Payer: Self-pay | Admitting: Obstetrics and Gynecology

## 2021-12-13 NOTE — Patient Outreach (Cosign Needed)
Care Coordination  12/13/2021  Heith Haigler [facility] 08-Sep-1965 [id number]   Medicaid Managed Care   Unsuccessful Outreach Note  12/13/2021 Name: Collier Bohnet MRN: [id number] DOB: 10/27/1965  Referred by: Loura Pardon, MD Reason for referral : High Risk Managed Medicaid (Unsuccessful telephone outreach)   Third unsuccessful telephone outreach was attempted today. The patient was referred to the case management team for assistance with care management and care coordination. The patient's primary care provider has been notified of our unsuccessful attempts to make or maintain contact with the patient. The care management team is pleased to engage with this patient at any time in the future should he/she be interested in assistance from the care management team.   Follow Up Plan: We have been unable to make contact with the patient for follow up. The care management team is available to follow up with the patient after provider conversation with the patient regarding recommendation for care management engagement and subsequent re-referral to the care management team.   Kathi Der RN, BSN [place]  [facility] Care Management Coordinator - Managed [place] High Risk [phone]

## 2021-12-13 NOTE — Patient Instructions (Signed)
Visit Information  Mr. Dean Clements [name]  - as a part of your Medicaid benefit, you are eligible for care management and care coordination services at no cost or copay. I was unable to reach you by phone today but would be happy to help you with your health related needs. Please feel free to call me at [phone].  Kathi Der RN, BSN [place]  [employer] - Managed Medicaid High Risk [phone]

## 2022-02-04 ENCOUNTER — Ambulatory Visit (INDEPENDENT_AMBULATORY_CARE_PROVIDER_SITE_OTHER): Payer: Medicaid Other | Admitting: Nurse Practitioner

## 2022-02-04 ENCOUNTER — Encounter: Payer: Self-pay | Admitting: Nurse Practitioner

## 2022-02-04 VITALS — BP 141/89 | HR 91 | Temp 98.8°F | Wt 231.1 lb

## 2022-02-04 DIAGNOSIS — L739 Follicular disorder, unspecified: Secondary | ICD-10-CM | POA: Diagnosis not present

## 2022-02-04 MED ORDER — TRULICITY 0.75 MG/0.5ML ~~LOC~~ SOAJ: SUBCUTANEOUS | 1 refills | Status: DC

## 2022-02-04 MED ORDER — TRIAMCINOLONE ACETONIDE 40 MG/ML IJ SUSP
40.0000 mg | Freq: Once | INTRAMUSCULAR | Status: AC
  Administered 2022-02-04: 40 mg via INTRAMUSCULAR

## 2022-02-04 MED ORDER — SULFAMETHOXAZOLE-TRIMETHOPRIM 800-160 MG PO TABS: 1.0000 | ORAL_TABLET | Freq: Two times a day (BID) | ORAL | 0 refills | Status: DC

## 2022-02-04 MED ORDER — PREDNISONE 10 MG PO TABS: 10.0000 mg | ORAL_TABLET | Freq: Every day | ORAL | 0 refills | Status: DC

## 2022-02-04 NOTE — Progress Notes (Signed)
BP (!) 141/89   Pulse 91   Temp 98.8 F (37.1 C) (Oral)   Wt 231 lb 1.6 oz (104.8 kg)   SpO2 98%   BMI 39.07 kg/m    Subjective:    Patient ID: Dean Clements, male    DOB: 26-Jan-1966, 55 y.o.   MRN: [id number]  HPI: Dean Clements is a 56 y.o. male  Chief Complaint  Patient presents with   Skin Problem     Bialteral axilla mass? Patient reports it may be swollen lymph nodes. Difficulty to sleep at night. Patient also reports a mass on R buttock. Pain when sitting, no drainage. Ongoing x 1.5 weeks. Denies fevers, bodyaches, n/v/d.     Patient states he is having some swollen lymph nodes in his axilla bilaterally.  Patient states they started a week to a week and a half ago.  States it is causing him discomfort especially to sleep.  Denies any recent illness.  Denies any bug bites, falls, or trauma.    Relevant past medical, surgical, family and social history reviewed and updated as indicated. Interim medical history since our last visit reviewed. Allergies and medications reviewed and updated.  Review of Systems  Skin:        Bumps in bilateral axilla    Per HPI unless specifically indicated above     Objective:    BP (!) 141/89   Pulse 91   Temp 98.8 F (37.1 C) (Oral)   Wt 231 lb 1.6 oz (104.8 kg)   SpO2 98%   BMI 39.07 kg/m   Wt Readings from Last 3 Encounters:  02/04/22 231 lb 1.6 oz (104.8 kg)  11/06/21 220 lb 12.8 oz (100.2 kg)  10/16/21 229 lb 3.2 oz (104 kg)    Physical Exam Vitals and nursing note reviewed.  Constitutional:      General: He is not in acute distress.    Appearance: Normal appearance. He is not ill-appearing, toxic-appearing or diaphoretic.  HENT:     Head: Normocephalic.     Right Ear: External ear normal.     Left Ear: External ear normal.     Nose: Nose normal. No congestion or rhinorrhea.     Mouth/Throat:     Mouth: Mucous membranes are moist.  Eyes:     General:        Right eye: No discharge.         Left eye: No discharge.     Extraocular Movements: Extraocular movements intact.     Conjunctiva/sclera: Conjunctivae normal.     Pupils: Pupils are equal, round, and reactive to light.  Cardiovascular:     Rate and Rhythm: Normal rate and regular rhythm.     Heart sounds: No murmur heard. Pulmonary:     Effort: Pulmonary effort is normal. No respiratory distress.     Breath sounds: Normal breath sounds. No wheezing, rhonchi or rales.  Abdominal:     General: Abdomen is flat. Bowel sounds are normal.  Musculoskeletal:     Cervical back: Normal range of motion and neck supple.  Skin:    General: Skin is warm and dry.     Capillary Refill: Capillary refill takes less than 2 seconds.       Neurological:     General: No focal deficit present.     Mental Status: He is alert and oriented to person, place, and time.  Psychiatric:        Mood and Affect: Mood normal.  Behavior: Behavior normal.        Thought Content: Thought content normal.        Judgment: Judgment normal.     Results for orders placed or performed in visit on 11/06/21  Uric acid  Result Value Ref Range   Uric Acid 5.8 3.8 - 8.4 mg/dL  Comprehensive metabolic panel  Result Value Ref Range   Glucose 131 (H) 70 - 99 mg/dL   BUN 6 6 - 24 mg/dL   Creatinine, Ser 0.87 0.76 - 1.27 mg/dL   eGFR 102 >59 mL/min/1.73   BUN/Creatinine Ratio 7 (L) 9 - 20   Sodium 141 134 - 144 mmol/L   Potassium 4.3 3.5 - 5.2 mmol/L   Chloride 103 96 - 106 mmol/L   CO2 24 20 - 29 mmol/L   Calcium 8.9 8.7 - 10.2 mg/dL   Total Protein 6.6 6.0 - 8.5 g/dL   Albumin 4.1 3.8 - 4.9 g/dL   Globulin, Total 2.5 1.5 - 4.5 g/dL   Albumin/Globulin Ratio 1.6 1.2 - 2.2   Bilirubin Total 0.3 0.0 - 1.2 mg/dL   Alkaline Phosphatase 76 44 - 121 IU/L   AST 22 0 - 40 IU/L   ALT 23 0 - 44 IU/L  Bayer DCA Hb A1c Waived (STAT)  Result Value Ref Range   HB A1C (BAYER DCA - WAIVED) 6.9 (H) 4.8 - 5.6 %      Assessment & Plan:   Problem List  Items Addressed This Visit   None Visit Diagnoses     Folliculitis    -  Primary   Will give triamcinalone in office. Start Prednisone tomorrow. Complete course of bactrim.  Follow up if symptoms not improved.    Relevant Medications   triamcinolone acetonide (KENALOG-40) injection 40 mg (Start on 02/04/2022  3:30 PM)        Follow up plan: Return in about 1 month (around 03/07/2022) for HTN, HLD, DM2 FU.

## 2022-03-10 ENCOUNTER — Ambulatory Visit: Payer: Medicaid Other | Admitting: Nurse Practitioner

## 2022-03-10 NOTE — Progress Notes (Deleted)
There were no vitals taken for this visit.   Subjective:    Patient ID: Dean Clements, male    DOB: September 12, 1965, 55 y.o.   MRN: [id number]  HPI: Dean Clements is a 56 y.o. male  No chief complaint on file.  HYPERTENSION / HYPERLIPIDEMIA Satisfied with current treatment? {Blank single:19197::"yes","no"} Duration of hypertension: {Blank single:19197::"chronic","months","years"} BP monitoring frequency: {Blank single:19197::"not checking","rarely","daily","weekly","monthly","a few times a day","a few times a week","a few times a month"} BP range:  BP medication side effects: {Blank single:19197::"yes","no"} Past BP meds: {Blank BHALPFXT:02409::"BDZH","GDJMEQASTM","HDQQIWLNLG/XQJJHERDEY","CXKGYJEH","UDJSHFWYOV","ZCHYIFOYDX/AJOI","NOMVEHMCNO (bystolic)","carvedilol","chlorthalidone","clonidine","diltiazem","exforge HCT","HCTZ","irbesartan (avapro)","labetalol","lisinopril","lisinopril-HCTZ","losartan (cozaar)","methyldopa","nifedipine","olmesartan (benicar)","olmesartan-HCTZ","quinapril","ramipril","spironalactone","tekturna","valsartan","valsartan-HCTZ","verapamil"} Duration of hyperlipidemia: {Blank single:19197::"chronic","months","years"} Cholesterol medication side effects: {Blank single:19197::"yes","no"} Cholesterol supplements: {Blank multiple:19196::"none","fish oil","niacin","red yeast rice"} Past cholesterol medications: {Blank multiple:19196::"none","atorvastain (lipitor)","lovastatin (mevacor)","pravastatin (pravachol)","rosuvastatin (crestor)","simvastatin (zocor)","vytorin","fenofibrate (tricor)","gemfibrozil","ezetimide (zetia)","niaspan","lovaza"} Medication compliance: {Blank single:19197::"excellent compliance","good compliance","fair compliance","poor compliance"} Aspirin: {Blank single:19197::"yes","no"} Recent stressors: {Blank single:19197::"yes","no"} Recurrent headaches: {Blank single:19197::"yes","no"} Visual changes: {Blank  single:19197::"yes","no"} Palpitations: {Blank single:19197::"yes","no"} Dyspnea: {Blank single:19197::"yes","no"} Chest pain: {Blank single:19197::"yes","no"} Lower extremity edema: {Blank single:19197::"yes","no"} Dizzy/lightheaded: {Blank single:19197::"yes","no"}  DIABETES Hypoglycemic episodes:{Blank single:19197::"yes","no"} Polydipsia/polyuria: {Blank single:19197::"yes","no"} Visual disturbance: {Blank single:19197::"yes","no"} Chest pain: {Blank single:19197::"yes","no"} Paresthesias: {Blank single:19197::"yes","no"} Glucose Monitoring: {Blank single:19197::"yes","no"}  Accucheck frequency: {Blank single:19197::"Not Checking","Daily","BID","TID"}  Fasting glucose:  Post prandial:  Evening:  Before meals: Taking Insulin?: {Blank single:19197::"yes","no"}  Long acting insulin:  Short acting insulin: Blood Pressure Monitoring: {Blank single:19197::"not checking","rarely","daily","weekly","monthly","a few times a day","a few times a week","a few times a month"} Retinal Examination: {Blank single:19197::"Up to Date","Not up to Date"} Foot Exam: {Blank single:19197::"Up to Date","Not up to Date"} Diabetic Education: {Blank single:19197::"Completed","Not Completed"} Pneumovax: {Blank single:19197::"Up to Date","Not up to Date","unknown"} Influenza: {Blank single:19197::"Up to Date","Not up to Date","unknown"} Aspirin: {Blank single:19197::"yes","no"}  COPD COPD status: {Blank single:19197::"controlled","uncontrolled","better","worse","exacerbated","stable"} Satisfied with current treatment?: {Blank single:19197::"yes","no"} Oxygen use: {Blank single:19197::"yes","no"} Dyspnea frequency:  Cough frequency:  Rescue inhaler frequency:   Limitation of activity: {Blank single:19197::"yes","no"} Productive cough:  Last Spirometry:  Pneumovax: {Blank single:19197::"Up to Date","Not up to Date","unknown"} Influenza: {Blank single:19197::"Up to Date","Not up to  Date","unknown"}  Relevant past medical, surgical, family and social history reviewed and updated as indicated. Interim medical history since our last visit reviewed. Allergies and medications reviewed and updated.  Review of Systems  Per HPI unless specifically indicated above     Objective:    There were no vitals taken for this visit.  Wt Readings from Last 3 Encounters:  02/04/22 231 lb 1.6 oz (104.8 kg)  11/06/21 220 lb 12.8 oz (100.2 kg)  10/16/21 229 lb 3.2 oz (104 kg)    Physical Exam  Results for orders placed or performed in visit on 11/06/21  Uric acid  Result Value Ref Range   Uric Acid 5.8 3.8 - 8.4 mg/dL  Comprehensive metabolic panel  Result Value Ref Range   Glucose 131 (H) 70 - 99 mg/dL   BUN 6 6 - 24 mg/dL   Creatinine, Ser 0.87 0.76 - 1.27 mg/dL   eGFR 102 >59 mL/min/1.73   BUN/Creatinine Ratio 7 (L) 9 - 20   Sodium 141 134 - 144 mmol/L   Potassium 4.3 3.5 - 5.2 mmol/L   Chloride 103 96 - 106 mmol/L   CO2 24 20 - 29 mmol/L   Calcium 8.9 8.7 - 10.2 mg/dL   Total Protein 6.6 6.0 - 8.5 g/dL   Albumin 4.1 3.8 - 4.9 g/dL   Globulin, Total 2.5 1.5 - 4.5 g/dL   Albumin/Globulin Ratio 1.6 1.2 - 2.2   Bilirubin Total 0.3 0.0 - 1.2 mg/dL   Alkaline Phosphatase 76 44 - 121 IU/L  AST 22 0 - 40 IU/L   ALT 23 0 - 44 IU/L  Bayer DCA Hb A1c Waived (STAT)  Result Value Ref Range   HB A1C (BAYER DCA - WAIVED) 6.9 (H) 4.8 - 5.6 %      Assessment & Plan:   Problem List Items Addressed This Visit       Cardiovascular and Mediastinum   Essential hypertension     Respiratory   Chronic obstructive pulmonary disease (HCC)     Endocrine   Type 2 diabetes mellitus with diabetic neuropathy ([place])   Hyperlipidemia associated with type 2 diabetes mellitus ([place])     Other   Anxiety     Follow up plan: No follow-ups on file.

## 2022-03-11 ENCOUNTER — Other Ambulatory Visit: Payer: Self-pay | Admitting: Family Medicine

## 2022-03-11 DIAGNOSIS — M4726 Other spondylosis with radiculopathy, lumbar region: Secondary | ICD-10-CM

## 2022-03-11 DIAGNOSIS — G8929 Other chronic pain: Secondary | ICD-10-CM

## 2022-03-11 NOTE — Telephone Encounter (Signed)
Medication refill for Cyclobenzaprine last ov 02/04/22, upcoming ov no future visit . Please advise

## 2022-03-13 ENCOUNTER — Ambulatory Visit: Payer: Medicaid Other | Admitting: Physician Assistant

## 2022-03-13 ENCOUNTER — Encounter: Payer: Self-pay | Admitting: Physician Assistant

## 2022-03-13 VITALS — BP 133/84 | HR 105 | Temp 98.6°F | Ht 64.49 in | Wt 230.0 lb

## 2022-03-13 DIAGNOSIS — I10 Essential (primary) hypertension: Secondary | ICD-10-CM | POA: Diagnosis not present

## 2022-03-13 DIAGNOSIS — E1140 Type 2 diabetes mellitus with diabetic neuropathy, unspecified: Secondary | ICD-10-CM | POA: Diagnosis not present

## 2022-03-13 DIAGNOSIS — Z6838 Body mass index (BMI) 38.0-38.9, adult: Secondary | ICD-10-CM | POA: Diagnosis not present

## 2022-03-13 DIAGNOSIS — G8929 Other chronic pain: Secondary | ICD-10-CM

## 2022-03-13 DIAGNOSIS — E1169 Type 2 diabetes mellitus with other specified complication: Secondary | ICD-10-CM

## 2022-03-13 DIAGNOSIS — Z87891 Personal history of nicotine dependence: Secondary | ICD-10-CM | POA: Diagnosis not present

## 2022-03-13 DIAGNOSIS — M549 Dorsalgia, unspecified: Secondary | ICD-10-CM | POA: Diagnosis not present

## 2022-03-13 DIAGNOSIS — Z79899 Other long term (current) drug therapy: Secondary | ICD-10-CM | POA: Diagnosis not present

## 2022-03-13 DIAGNOSIS — M4726 Other spondylosis with radiculopathy, lumbar region: Secondary | ICD-10-CM

## 2022-03-13 DIAGNOSIS — J449 Chronic obstructive pulmonary disease, unspecified: Secondary | ICD-10-CM | POA: Diagnosis not present

## 2022-03-13 DIAGNOSIS — E785 Hyperlipidemia, unspecified: Secondary | ICD-10-CM

## 2022-03-13 DIAGNOSIS — Z7984 Long term (current) use of oral hypoglycemic drugs: Secondary | ICD-10-CM | POA: Diagnosis not present

## 2022-03-13 MED ORDER — CYCLOBENZAPRINE HCL 10 MG PO TABS: 10.0000 mg | ORAL_TABLET | Freq: Every day | ORAL | 0 refills | Status: DC

## 2022-03-13 MED ORDER — KETOROLAC TROMETHAMINE 60 MG/2ML IM SOLN
60.0000 mg | Freq: Once | INTRAMUSCULAR | Status: AC
  Administered 2022-03-13: 60 mg via INTRAMUSCULAR

## 2022-03-13 NOTE — Progress Notes (Signed)
Established Patient Office Visit  Name: Dean Clements   MRN: [id number]    DOB: 1965-09-16   Date:03/14/2022  Today's Provider: Talitha Givens, MHS, PA-C Introduced myself to the patient as a PA-C and provided education on APPs in clinical practice.         Subjective  Chief Complaint  Chief Complaint  Patient presents with   Back Pain    Patient has had back pain for the past month.  Per patient he NEEDS a Toradol shot    HPI  Reports he has left ring finger cut - has bandage on this  Does not want to take bandage off for examination Reports he tried to use sink and took off bandage - started bleeding in office    Acute Back pain Onset: gradual  Duration: ongoing for about a month  Unsure what set it off - reports he is having muscle spasms  Location: lower back and into right thoracic region  Associated: denies sciatic pain in legs, inner thigh pain, incontinence  Pain Level: 10/10, sharp, achy and dull, with spasms  Alleviating: Nothing  Aggravating: moving and ambulating  Interventions:ice and warm compresses, he has taken OTC Tylenol and Ibuprofen  He is requesting Toradol injection - wants one in each buttocks to help with pain if possible "If I could get a few norco or something that would help too"  States he was taking gabapentin 600 mg, he was switched down to 300 mg and reports he is having to take several of these to equal previous dose so he is running out before script ends   Diabetes, Type 2 - Last A1c 6.9 - Medications: Metformin 750 mg 2 tablets per day, Trulicity 4.70 mg subq injection per week,  - Compliance: Reports good compliance  - Checking BG at home: Does not check at home - has a kit but "I never use it"  - Diet: Not making dietary changes to assist with diabetes management - Exercise: does not exercise regularly - Eye exam: Has not had eye exam in some time - reviewed importance of regular yearly eye exams for diabetic  retinopathy monitoring  - Foot exam: will complete at next apt - Microalbumin: Up to date  - Statin: On statin therapy  - PNA vaccine: not completed  - Denies symptoms of hypoglycemia, polyuria, polydipsia, numbness extremities, foot ulcers/trauma  HYPERTENSION / HYPERLIPIDEMIA Satisfied with current treatment? yes Duration of hypertension: years BP monitoring frequency: not checking BP range:  BP medication side effects: no Past BP meds: losartan (cozaar) Duration of hyperlipidemia: years Cholesterol medication side effects: no Cholesterol supplements: none Past cholesterol medications: rosuvastatin (crestor) Medication compliance: good compliance Aspirin: no Recent stressors: no Recurrent headaches: no Visual changes: no Palpitations: no Dyspnea: no Chest pain: no Lower extremity edema: no Dizzy/lightheaded: no          Patient Active Problem List   Diagnosis Date Noted   Chronic obstructive pulmonary disease ([place]) 11/06/2021   Pain in both feet 11/06/2021   Chronic cough 10/16/2021   Wheezing 10/16/2021   Acute bronchitis with COPD ([place]) 10/16/2021   Chronic right-sided back pain 08/08/2021   HNP (herniated nucleus pulposus), lumbar 07/27/2020   Spinal stenosis of lumbosacral region 05/17/2020   Lumbar spondylitis ([place]) 05/02/2020   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[place]) 04/19/2020   Polyneuropathy 03/12/2020   Herniated lumbar disc without myelopathy 02/02/2020   Carpal tunnel syndrome 12/23/2019   Lumbago with sciatica, unspecified side 12/23/2019  Neuropathy 12/23/2019   Osteoarthritis of spine with radiculopathy, cervical region 12/09/2019   Osteoarthritis of spine with radiculopathy, lumbar region 09/07/2019   Background diabetic retinopathy ([place]) 05/30/2019   Gout 06/14/2018   Hyperlipidemia associated with type 2 diabetes mellitus ([place]) 06/14/2018   Chronic bilateral back pain 06/04/2016   Type 2 diabetes mellitus with diabetic neuropathy ([place])  09/10/2015   Essential hypertension 04/03/2015   Anxiety 04/03/2015   GERD (gastroesophageal reflux disease) 01/15/2015   Insomnia 01/15/2015   Obesity (BMI 30.0-34.9) 01/15/2015    Past Surgical History:  Procedure Laterality Date   LUMBAR LAMINECTOMY/DECOMPRESSION MICRODISCECTOMY Bilateral 07/27/2020   Procedure: Laminectomy Lumbar four-Lumbar five - bilateral, Microdiscectomy - Lumbar five-Sacral one - right;  Surgeon: Kary Kos, MD;  Location: [facility];  Service: Neurosurgery;  Laterality: Bilateral;   none     TONSILLECTOMY     as a child   WISDOM TOOTH EXTRACTION     as a teenager    Family History  Problem Relation Age of Onset   Cancer Mother    Heart disease Father    Diabetes Father    Hypertension Father     Social History   Tobacco Use   Smoking status: Former    Packs/day: 0.20    Years: 3.00    Total pack years: 0.60    Types: Cigarettes   Smokeless tobacco: Current  Substance Use Topics   Alcohol use: Yes    Alcohol/week: 12.0 standard drinks of alcohol    Types: 12 Cans of beer per week    Comment: weekly     Current Outpatient Medications:    Accu-Chek Softclix Lancets lancets, SMARTSIG:Topical, Disp: , Rfl:    albuterol (VENTOLIN HFA) 108 (90 Base) MCG/ACT inhaler, Inhale 2 puffs into the lungs every 6 (six) hours as needed for wheezing or shortness of breath., Disp: 8 g, Rfl: 3   cyclobenzaprine (FLEXERIL) 10 MG tablet, Take 1 tablet (10 mg total) by mouth at bedtime., Disp: 30 tablet, Rfl: 0   Dulaglutide (TRULICITY) 5.40 GQ/6.7YP SOPN, INJECT 0.75MG INTO THE SKIN ONCE A WEEK, Disp: 6 mL, Rfl: 1   fexofenadine (ALLEGRA ALLERGY) 180 MG tablet, Take 1 tablet (180 mg total) by mouth daily. (Patient not taking: Reported on 02/04/2022), Disp: 10 tablet, Rfl: 1   fluticasone furoate-vilanterol (BREO ELLIPTA) 100-25 MCG/ACT AEPB, Inhale 1 puff into the lungs daily., Disp: 1 each, Rfl: 1   gabapentin (NEURONTIN) 300 MG capsule, Take 1 capsule (300 mg total)  by mouth 2 (two) times daily., Disp: 180 capsule, Rfl: 1   losartan (COZAAR) 25 MG tablet, Take 1 tablet (25 mg total) by mouth daily., Disp: 90 tablet, Rfl: 1   metFORMIN (GLUCOPHAGE-XR) 750 MG 24 hr tablet, Take 2 tablets (1,500 mg total) by mouth daily with breakfast., Disp: 180 tablet, Rfl: 1   pantoprazole (PROTONIX) 40 MG tablet, Take 1 tablet (40 mg total) by mouth daily., Disp: 90 tablet, Rfl: 1   rosuvastatin (CRESTOR) 40 MG tablet, Take 1 tablet (40 mg total) by mouth daily., Disp: 90 tablet, Rfl: 1   sulfamethoxazole-trimethoprim (BACTRIM DS) 800-160 MG tablet, Take 1 tablet by mouth 2 (two) times daily., Disp: 20 tablet, Rfl: 0   Tiotropium Bromide Monohydrate (SPIRIVA RESPIMAT) 2.5 MCG/ACT AERS, Inhale 2 puffs into the lungs daily., Disp: 1 each, Rfl: 2   traZODone (DESYREL) 50 MG tablet, TAKE 1/2-1 TABLET BY MOUTH AT BEDTIME ASNEEDED FOR SLEEP, Disp: 30 tablet, Rfl: 3  No Known Allergies  I personally reviewed  active problem list, medication list, allergies, health maintenance, notes from last encounter, lab results with the patient/caregiver today.   Review of Systems  Constitutional:  Negative for chills, diaphoresis and fever.  Eyes:  Negative for blurred vision and double vision.  Respiratory:  Negative for shortness of breath and wheezing.   Cardiovascular:  Negative for chest pain, palpitations and leg swelling.  Gastrointestinal:  Negative for diarrhea, nausea and vomiting.  Musculoskeletal:  Positive for back pain.  Neurological:  Negative for dizziness and headaches.      Objective  Vitals:   03/13/22 1431  BP: 133/84  Pulse: (!) 105  Temp: 98.6 F (37 C)  TempSrc: Oral  SpO2: 95%  Weight: 230 lb (104.3 kg)  Height: 5' 4.49" (1.638 m)    Body mass index is 38.88 kg/m.  Physical Exam Vitals reviewed.  Constitutional:      General: He is awake.     Appearance: Normal appearance. He is well-developed and well-groomed. He is obese.  HENT:     Head:  Normocephalic and atraumatic.  Cardiovascular:     Rate and Rhythm: Normal rate and regular rhythm.     Pulses: Normal pulses.          Radial pulses are 2+ on the right side and 2+ on the left side.     Heart sounds: Normal heart sounds. No murmur heard.    No friction rub. No gallop.  Pulmonary:     Effort: Pulmonary effort is normal.     Breath sounds: Normal breath sounds. No decreased air movement. No decreased breath sounds, wheezing, rhonchi or rales.  Musculoskeletal:     Cervical back: Normal and normal range of motion. No swelling, edema, deformity or erythema. Normal range of motion.     Thoracic back: No swelling or edema. Decreased range of motion.     Lumbar back: No swelling or deformity. Decreased range of motion. Negative right straight leg raise test and negative left straight leg raise test.     Right lower leg: No edema.     Left lower leg: No edema.     Comments: Reduced ROM - flexion reduced,  He reports pain with lateral rotation and lateral flexion - especially towards the right   Neurological:     Mental Status: He is alert.  Psychiatric:        Attention and Perception: Attention and perception normal.        Mood and Affect: Mood normal.        Speech: Speech is rapid and pressured.        Behavior: Behavior normal. Behavior is cooperative.      Recent Results (from the past 2160 hour(s))  HgB A1c     Status: Abnormal   Collection Time: 03/13/22  3:35 PM  Result Value Ref Range   Hgb A1c MFr Bld 6.6 (H) 4.8 - 5.6 %    Comment:          Prediabetes: 5.7 - 6.4          Diabetes: >6.4          Glycemic control for adults with diabetes: <7.0    Est. average glucose Bld gHb Est-mCnc 143 mg/dL  Comp Met (CMET)     Status: Abnormal   Collection Time: 03/13/22  3:35 PM  Result Value Ref Range   Glucose 112 (H) 70 - 99 mg/dL   BUN 14 6 - 24 mg/dL   Creatinine, Ser 0.98 0.76 -  1.27 mg/dL   eGFR 91 >59 mL/min/1.73   BUN/Creatinine Ratio 14 9 - 20    Sodium 139 134 - 144 mmol/L   Potassium 4.7 3.5 - 5.2 mmol/L   Chloride 99 96 - 106 mmol/L   CO2 21 20 - 29 mmol/L   Calcium 9.4 8.7 - 10.2 mg/dL   Total Protein 6.5 6.0 - 8.5 g/dL   Albumin 4.4 3.8 - 4.9 g/dL   Globulin, Total 2.1 1.5 - 4.5 g/dL   Albumin/Globulin Ratio 2.1 1.2 - 2.2   Bilirubin Total 0.4 0.0 - 1.2 mg/dL   Alkaline Phosphatase 74 44 - 121 IU/L   AST 24 0 - 40 IU/L   ALT 22 0 - 44 IU/L  CBC w/Diff     Status: None   Collection Time: 03/13/22  3:35 PM  Result Value Ref Range   WBC 10.2 3.4 - 10.8 x10E3/uL   RBC 5.36 4.14 - 5.80 x10E6/uL   Hemoglobin 16.2 13.0 - 17.7 g/dL   Hematocrit 47.3 37.5 - 51.0 %   MCV 88 79 - 97 fL   MCH 30.2 26.6 - 33.0 pg   MCHC 34.2 31.5 - 35.7 g/dL   RDW 12.8 11.6 - 15.4 %   Platelets 329 150 - 450 x10E3/uL   Neutrophils 57 Not Estab. %   Lymphs 29 Not Estab. %   Monocytes 9 Not Estab. %   Eos 3 Not Estab. %   Basos 1 Not Estab. %   Neutrophils Absolute 5.9 1.4 - 7.0 x10E3/uL   Lymphocytes Absolute 3.0 0.7 - 3.1 x10E3/uL   Monocytes Absolute 0.9 0.1 - 0.9 x10E3/uL   EOS (ABSOLUTE) 0.3 0.0 - 0.4 x10E3/uL   Basophils Absolute 0.1 0.0 - 0.2 x10E3/uL   Immature Granulocytes 1 Not Estab. %   Immature Grans (Abs) 0.1 0.0 - 0.1 x10E3/uL  Lipid Profile     Status: Abnormal   Collection Time: 03/13/22  3:35 PM  Result Value Ref Range   Cholesterol, Total 234 (H) 100 - 199 mg/dL   Triglycerides 196 (H) 0 - 149 mg/dL   HDL 47 >39 mg/dL   VLDL Cholesterol Cal 36 5 - 40 mg/dL   LDL Chol Calc (NIH) 151 (H) 0 - 99 mg/dL   Chol/HDL Ratio 5.0 0.0 - 5.0 ratio    Comment:                                   T. Chol/HDL Ratio                                             Men  Women                               1/2 Avg.Risk  3.4    3.3                                   Avg.Risk  5.0    4.4                                2X Avg.Risk  9.6  7.1                                3X Avg.Risk 23.4   11.0      PHQ2/9:    02/04/2022    3:03 PM  10/16/2021    2:12 PM 08/08/2021    9:13 AM 11/19/2020    2:15 PM 03/26/2020    2:19 PM  Depression screen PHQ 2/9  Decreased Interest 0 0 0 0 0  Down, Depressed, Hopeless 0 0 0 0 0  PHQ - 2 Score 0 0 0 0 0  Altered sleeping 3 0 3 3   Tired, decreased energy 1 3 0 1   Change in appetite 0 3 0 0   Feeling bad or failure about yourself  0 0 0 0   Trouble concentrating 0 0 0 0   Moving slowly or fidgety/restless 0 0 0 0   Suicidal thoughts 0 0 0 0   PHQ-9 Score 4 6 3 4    Difficult doing work/chores Somewhat difficult Not difficult at all Not difficult at all Not difficult at all       Fall Risk:    03/13/2022    2:37 PM 02/04/2022    3:03 PM 10/16/2021    2:12 PM 08/08/2021    9:13 AM 11/19/2020    2:15 PM  Fall Risk   Falls in the past year? 0 0 0 1 0  Number falls in past yr: 0 0 0 1 0  Injury with Fall? 0 0 0 1 0  Risk for fall due to : No Fall Risks No Fall Risks No Fall Risks Impaired balance/gait;Orthopedic patient No Fall Risks  Follow up Falls evaluation completed Falls evaluation completed Falls evaluation completed Falls evaluation completed Falls evaluation completed      Functional Status Survey:      Assessment & Plan  Problem List Items Addressed This Visit       Cardiovascular and Mediastinum   Essential hypertension    Chronic, historic condition Currently taking Losartan 25 mg PO QD and appears to be tolerating well Continue current regimen Recommend incorporating heart healthy diet and exercise into daily regimen to provide further management and improve CVD health  Will order labs today Follow up with PCP in 3 months for monitoring        Relevant Orders   Comp Met (CMET) (Completed)   CBC w/Diff (Completed)     Endocrine   Type 2 diabetes mellitus with diabetic neuropathy (HCC)    Chronic, historic condition, appears stable He is currently taking Metformin 750 mg PO QD x 2 tablets and Trulicity 0.45 mg injection once weekly - reports he is  tolerating this well without side effects Will recheck A1c today for monitoring Continue current regimen pending lab results Recommended yearly eye exam  Follow up in 3 months for monitoring and medication management       Relevant Orders   HgB A1c (Completed)   Comp Met (CMET) (Completed)   Hyperlipidemia associated with type 2 diabetes mellitus (HCC)    Chronic, historic condition Appears stable per most recent lipid panel- will repeat today, results to dictate further management He is taking Rosuvastatin 40 mg PO QD and reports good tolerance and compliance Continue current medication at this time  Follow up in 6 months or sooner if concerns arise      Relevant Orders   Lipid Profile (Completed)  Other   Chronic right-sided back pain - Primary    Chronic, recurrent, appears exacerbated per patient reports He indicates right sided back pain in thoracic through lumbar regions and has tried at home measures- warm and cold compresses, Tylenol and Ibuprofen He states at this time that the only thing that will improve pain is a Toradol injection and refill of Flexeril based on previous experience Will provide Toradol 60 mg injection today and write script for flexeril to assist with ongoing back spasms since he has tried home therapy If this persists may need to involve PT for more chronic relief Follow up as needed for persistent or progressing symptoms      Relevant Medications   cyclobenzaprine (FLEXERIL) 10 MG tablet     Return in about 3 months (around 06/13/2022) for HTN, Diabetes follow up, COPD.   I, Avrohom Mckelvin E Jaspal Pultz, PA-C, have reviewed all documentation for this visit. The documentation on 03/14/22 for the exam, diagnosis, procedures, and orders are all accurate and complete.   Talitha Givens, MHS, PA-C [facility]

## 2022-03-14 ENCOUNTER — Ambulatory Visit: Payer: Self-pay | Admitting: *Deleted

## 2022-03-14 LAB — LIPID PANEL
Chol/HDL Ratio: 5 ratio (ref 0.0–5.0)
Cholesterol, Total: 234 mg/dL — ABNORMAL HIGH (ref 100–199)
HDL: 47 mg/dL (ref >39)
LDL Chol Calc (NIH): 151 mg/dL — ABNORMAL HIGH (ref 0–99)
Triglycerides: 196 mg/dL — ABNORMAL HIGH (ref 0–149)
VLDL Cholesterol Cal: 36 mg/dL (ref 5–40)

## 2022-03-14 LAB — CBC WITH DIFFERENTIAL/PLATELET
Basophils Absolute: 0.1 10*3/uL (ref 0.0–0.2)
Basos: 1 %
EOS (ABSOLUTE): 0.3 10*3/uL (ref 0.0–0.4)
Eos: 3 %
Hematocrit: 47.3 % (ref 37.5–51.0)
Hemoglobin: 16.2 g/dL (ref 13.0–17.7)
Immature Grans (Abs): 0.1 10*3/uL (ref 0.0–0.1)
Immature Granulocytes: 1 %
Lymphocytes Absolute: 3 10*3/uL (ref 0.7–3.1)
Lymphs: 29 %
MCH: 30.2 pg (ref 26.6–33.0)
MCHC: 34.2 g/dL (ref 31.5–35.7)
MCV: 88 fL (ref 79–97)
Monocytes Absolute: 0.9 10*3/uL (ref 0.1–0.9)
Monocytes: 9 %
Neutrophils Absolute: 5.9 10*3/uL (ref 1.4–7.0)
Neutrophils: 57 %
Platelets: 329 10*3/uL (ref 150–450)
RBC: 5.36 x10E6/uL (ref 4.14–5.80)
RDW: 12.8 % (ref 11.6–15.4)
WBC: 10.2 10*3/uL (ref 3.4–10.8)

## 2022-03-14 LAB — COMPREHENSIVE METABOLIC PANEL
ALT: 22 IU/L (ref 0–44)
AST: 24 IU/L (ref 0–40)
Albumin/Globulin Ratio: 2.1 (ref 1.2–2.2)
Albumin: 4.4 g/dL (ref 3.8–4.9)
Alkaline Phosphatase: 74 IU/L (ref 44–121)
BUN/Creatinine Ratio: 14 (ref 9–20)
BUN: 14 mg/dL (ref 6–24)
Bilirubin Total: 0.4 mg/dL (ref 0.0–1.2)
CO2: 21 mmol/L (ref 20–29)
Calcium: 9.4 mg/dL (ref 8.7–10.2)
Chloride: 99 mmol/L (ref 96–106)
Creatinine, Ser: 0.98 mg/dL (ref 0.76–1.27)
Globulin, Total: 2.1 g/dL (ref 1.5–4.5)
Glucose: 112 mg/dL — ABNORMAL HIGH (ref 70–99)
Potassium: 4.7 mmol/L (ref 3.5–5.2)
Sodium: 139 mmol/L (ref 134–144)
Total Protein: 6.5 g/dL (ref 6.0–8.5)
eGFR: 91 mL/min/{1.73_m2} (ref >59)

## 2022-03-14 LAB — HEMOGLOBIN A1C
Est. average glucose Bld gHb Est-mCnc: 143 mg/dL
Hgb A1c MFr Bld: 6.6 % — ABNORMAL HIGH (ref 4.8–5.6)

## 2022-03-14 NOTE — Assessment & Plan Note (Signed)
Chronic, historic condition Appears stable per most recent lipid panel- will repeat today, results to dictate further management He is taking Rosuvastatin 40 mg PO QD and reports good tolerance and compliance Continue current medication at this time  Follow up in 6 months or sooner if concerns arise

## 2022-03-14 NOTE — Assessment & Plan Note (Signed)
Chronic, recurrent, appears exacerbated per patient reports He indicates right sided back pain in thoracic through lumbar regions and has tried at home measures- warm and cold compresses, Tylenol and Ibuprofen He states at this time that the only thing that will improve pain is a Toradol injection and refill of Flexeril based on previous experience Will provide Toradol 60 mg injection today and write script for flexeril to assist with ongoing back spasms since he has tried home therapy If this persists may need to involve PT for more chronic relief Follow up as needed for persistent or progressing symptoms

## 2022-03-14 NOTE — Assessment & Plan Note (Signed)
Chronic, historic condition, appears stable He is currently taking Metformin 750 mg PO QD x 2 tablets and Trulicity 5.00 mg injection once weekly - reports he is tolerating this well without side effects Will recheck A1c today for monitoring Continue current regimen pending lab results Recommended yearly eye exam  Follow up in 3 months for monitoring and medication management

## 2022-03-14 NOTE — Telephone Encounter (Signed)
Summary: back pain   Pt called saying he was in yesterday and got shot of Toradol for lower back pain.  He is still having pain and asking what should he do now.     CB@ [phone]        Chief Complaint: requesting medication for low back pain. Shot of toradol given yesterday with no relief. Still having pain   Symptoms: low back pain and muscle spasms comes and goes and severe when happens.  Frequency: today  Pertinent Negatives: Patient denies N/T weakness in LE's Disposition: [] ED /[] Urgent Care (no appt availability in office) / [] Appointment(In office/virtual)/ []  [facility]/ [] Home Care/ [] Refused Recommended Disposition /[]  Mobile Bus/ [x]  Follow-up with PCP Additional Notes:   Seen in OV yesterday. Requesting medication for back pain. Toradol not effective. Patient reports he is running out of medication early due to taking  more than prescribed due to pain. Reports taking Neurontin 600 mg twice daily instead of 300 mg twice daily as ordered. Requesting to increase dose of flexeril as well. Please advise.        Reason for Disposition  [1] MODERATE back pain (e.g., interferes with normal activities) AND [2] present > 3 days  Answer Assessment - Initial Assessment Questions 1. ONSET: "When did the pain begin?"      Na . Seen in office for pain in back yesterday  2. LOCATION: "Where does it hurt?" (upper, mid or lower back)     Low back  3. SEVERITY: "How bad is the pain?"  (e.g., Scale 1-10; mild, moderate, or severe)   - MILD (1-3): Doesn't interfere with normal activities.    - MODERATE (4-7): Interferes with normal activities or awakens from sleep.    - SEVERE (8-10): Excruciating pain, unable to do any normal activities.      Severe at times with certain movement  4. PATTERN: "Is the pain constant?" (e.g., yes, no; constant, intermittent)      Muscle spasms comes and goes but more often than not  5. RADIATION: "Does the pain shoot into your  legs or somewhere else?"     no 6. CAUSE:  "What do you think is causing the back pain?"      Muscle spasms 7. BACK OVERUSE:  "Any recent lifting of heavy objects, strenuous work or exercise?"     na 8. MEDICINES: "What have you taken so far for the pain?" (e.g., nothing, acetaminophen, NSAIDS)     Just had shot of toradol yesterday with no relief 9. NEUROLOGIC SYMPTOMS: "Do you have any weakness, numbness, or problems with bowel/bladder control?"     Denies  10. OTHER SYMPTOMS: "Do you have any other symptoms?" (e.g., fever, abdomen pain, burning with urination, blood in urine)       Low back pain with spasms with movement  11. PREGNANCY: "Is there any chance you are pregnant?" "When was your last menstrual period?"       na  Protocols used: Back Pain-A-AH

## 2022-03-14 NOTE — Assessment & Plan Note (Signed)
Chronic, historic condition Currently taking Losartan 25 mg PO QD and appears to be tolerating well Continue current regimen Recommend incorporating heart healthy diet and exercise into daily regimen to provide further management and improve CVD health  Will order labs today Follow up with PCP in 3 months for monitoring

## 2022-03-17 ENCOUNTER — Ambulatory Visit: Payer: Medicaid Other | Admitting: Physician Assistant

## 2022-03-17 NOTE — Progress Notes (Deleted)
Established Patient Office Visit  Name: Dean Clements   MRN: [id number]    DOB: 10-06-1965   Date:03/17/2022  Today's Provider: Talitha Givens, MHS, PA-C Introduced myself to the patient as a PA-C and provided education on APPs in clinical practice.         Subjective  Chief Complaint  No chief complaint on file.   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[place]) 04/19/2020   Polyneuropathy 03/12/2020   Herniated lumbar disc without myelopathy 02/02/2020   Carpal tunnel syndrome 12/23/2019   Lumbago with sciatica, unspecified side 12/23/2019   Neuropathy 12/23/2019   Osteoarthritis of spine with radiculopathy, cervical region 12/09/2019   Osteoarthritis of spine with radiculopathy, lumbar region 09/07/2019   Background diabetic retinopathy ([place]) 05/30/2019   Gout 06/14/2018   Hyperlipidemia associated with type 2 diabetes mellitus ([place]) 06/14/2018   Chronic bilateral back pain 06/04/2016   Type 2 diabetes mellitus with diabetic neuropathy ([place]) 09/10/2015   Essential hypertension 04/03/2015   Anxiety 04/03/2015   GERD (gastroesophageal reflux disease) 01/15/2015   Insomnia 01/15/2015   Obesity (BMI 30.0-34.9) 01/15/2015    Past Surgical History:  Procedure Laterality Date   LUMBAR LAMINECTOMY/DECOMPRESSION MICRODISCECTOMY Bilateral 07/27/2020   Procedure: Laminectomy Lumbar four-Lumbar five - bilateral, Microdiscectomy - Lumbar five-Sacral one - right;  Surgeon: Kary Kos, MD;  Location: [facility];  Service: Neurosurgery;  Laterality:  Bilateral;   none     TONSILLECTOMY     as a child   WISDOM TOOTH EXTRACTION     as a teenager    Family History  Problem Relation Age of Onset   Cancer Mother    Heart disease Father    Diabetes Father    Hypertension Father     Social History   Tobacco Use   Smoking status: Former    Packs/day: 0.20    Years: 3.00    Total pack years: 0.60    Types: Cigarettes   Smokeless tobacco: Current  Substance Use Topics   Alcohol use: Yes    Alcohol/week: 12.0 standard drinks of alcohol    Types: 12 Cans of beer per week    Comment: weekly     Current Outpatient Medications:    Accu-Chek Softclix Lancets lancets, SMARTSIG:Topical, Disp: , Rfl:    albuterol (VENTOLIN HFA) 108 (90 Base) MCG/ACT inhaler, Inhale 2 puffs into the lungs every 6 (six) hours as needed for wheezing or shortness of breath., Disp: 8 g, Rfl: 3   cyclobenzaprine (FLEXERIL) 10 MG tablet, Take 1 tablet (10 mg total) by mouth at bedtime., Disp: 30 tablet, Rfl: 0   Dulaglutide (TRULICITY) 5.18 AC/1.6SA SOPN, INJECT 0.75MG INTO THE SKIN ONCE A WEEK, Disp: 6 mL, Rfl: 1   fexofenadine (ALLEGRA ALLERGY) 180 MG tablet, Take 1 tablet (180 mg total) by mouth daily. (Patient not taking: Reported on 02/04/2022), Disp: 10 tablet, Rfl: 1   fluticasone furoate-vilanterol (BREO ELLIPTA) 100-25 MCG/ACT AEPB, Inhale 1 puff into the lungs daily., Disp: 1 each, Rfl: 1   gabapentin (NEURONTIN) 300 MG capsule,  Take 1 capsule (300 mg total) by mouth 2 (two) times daily., Disp: 180 capsule, Rfl: 1   losartan (COZAAR) 25 MG tablet, Take 1 tablet (25 mg total) by mouth daily., Disp: 90 tablet, Rfl: 1   metFORMIN (GLUCOPHAGE-XR) 750 MG 24 hr tablet, Take 2 tablets (1,500 mg total) by mouth daily with breakfast., Disp: 180 tablet, Rfl: 1   pantoprazole (PROTONIX) 40 MG tablet, Take 1 tablet (40 mg total) by mouth daily., Disp: 90 tablet, Rfl: 1   rosuvastatin (CRESTOR) 40 MG tablet, Take 1 tablet (40 mg total) by mouth daily., Disp: 90  tablet, Rfl: 1   sulfamethoxazole-trimethoprim (BACTRIM DS) 800-160 MG tablet, Take 1 tablet by mouth 2 (two) times daily., Disp: 20 tablet, Rfl: 0   Tiotropium Bromide Monohydrate (SPIRIVA RESPIMAT) 2.5 MCG/ACT AERS, Inhale 2 puffs into the lungs daily., Disp: 1 each, Rfl: 2   traZODone (DESYREL) 50 MG tablet, TAKE 1/2-1 TABLET BY MOUTH AT BEDTIME ASNEEDED FOR SLEEP, Disp: 30 tablet, Rfl: 3  No Known Allergies  I personally reviewed {Reviewed:[id number]} with the patient/caregiver today.   ROS    Objective  There were no vitals filed for this visit.  There is no height or weight on file to calculate BMI.  Physical Exam   Recent Results (from the past 2160 hour(s))  HgB A1c     Status: Abnormal   Collection Time: 03/13/22  3:35 PM  Result Value Ref Range   Hgb A1c MFr Bld 6.6 (H) 4.8 - 5.6 %    Comment:          Prediabetes: 5.7 - 6.4          Diabetes: >6.4          Glycemic control for adults with diabetes: <7.0    Est. average glucose Bld gHb Est-mCnc 143 mg/dL  Comp Met (CMET)     Status: Abnormal   Collection Time: 03/13/22  3:35 PM  Result Value Ref Range   Glucose 112 (H) 70 - 99 mg/dL   BUN 14 6 - 24 mg/dL   Creatinine, Ser 0.98 0.76 - 1.27 mg/dL   eGFR 91 >59 mL/min/1.73   BUN/Creatinine Ratio 14 9 - 20   Sodium 139 134 - 144 mmol/L   Potassium 4.7 3.5 - 5.2 mmol/L   Chloride 99 96 - 106 mmol/L   CO2 21 20 - 29 mmol/L   Calcium 9.4 8.7 - 10.2 mg/dL   Total Protein 6.5 6.0 - 8.5 g/dL   Albumin 4.4 3.8 - 4.9 g/dL   Globulin, Total 2.1 1.5 - 4.5 g/dL   Albumin/Globulin Ratio 2.1 1.2 - 2.2   Bilirubin Total 0.4 0.0 - 1.2 mg/dL   Alkaline Phosphatase 74 44 - 121 IU/L   AST 24 0 - 40 IU/L   ALT 22 0 - 44 IU/L  CBC w/Diff     Status: None   Collection Time: 03/13/22  3:35 PM  Result Value Ref Range   WBC 10.2 3.4 - 10.8 x10E3/uL   RBC 5.36 4.14 - 5.80 x10E6/uL   Hemoglobin 16.2 13.0 - 17.7 g/dL   Hematocrit 47.3 37.5 - 51.0 %   MCV 88 79 - 97 fL   MCH 30.2  26.6 - 33.0 pg   MCHC 34.2 31.5 - 35.7 g/dL   RDW 12.8 11.6 - 15.4 %   Platelets 329 150 - 450 x10E3/uL   Neutrophils 57 Not Estab. %   Lymphs 29 Not Estab. %   Monocytes 9 Not Estab. %  Eos 3 Not Estab. %   Basos 1 Not Estab. %   Neutrophils Absolute 5.9 1.4 - 7.0 x10E3/uL   Lymphocytes Absolute 3.0 0.7 - 3.1 x10E3/uL   Monocytes Absolute 0.9 0.1 - 0.9 x10E3/uL   EOS (ABSOLUTE) 0.3 0.0 - 0.4 x10E3/uL   Basophils Absolute 0.1 0.0 - 0.2 x10E3/uL   Immature Granulocytes 1 Not Estab. %   Immature Grans (Abs) 0.1 0.0 - 0.1 x10E3/uL  Lipid Profile     Status: Abnormal   Collection Time: 03/13/22  3:35 PM  Result Value Ref Range   Cholesterol, Total 234 (H) 100 - 199 mg/dL   Triglycerides 196 (H) 0 - 149 mg/dL   HDL 47 >39 mg/dL   VLDL Cholesterol Cal 36 5 - 40 mg/dL   LDL Chol Calc (NIH) 151 (H) 0 - 99 mg/dL   Chol/HDL Ratio 5.0 0.0 - 5.0 ratio    Comment:                                   T. Chol/HDL Ratio                                             Men  Women                               1/2 Avg.Risk  3.4    3.3                                   Avg.Risk  5.0    4.4                                2X Avg.Risk  9.6    7.1                                3X Avg.Risk 23.4   11.0      PHQ2/9:    02/04/2022    3:03 PM 10/16/2021    2:12 PM 08/08/2021    9:13 AM 11/19/2020    2:15 PM 03/26/2020    2:19 PM  Depression screen PHQ 2/9  Decreased Interest 0 0 0 0 0  Down, Depressed, Hopeless 0 0 0 0 0  PHQ - 2 Score 0 0 0 0 0  Altered sleeping 3 0 3 3   Tired, decreased energy 1 3 0 1   Change in appetite 0 3 0 0   Feeling bad or failure about yourself  0 0 0 0   Trouble concentrating 0 0 0 0   Moving slowly or fidgety/restless 0 0 0 0   Suicidal thoughts 0 0 0 0   PHQ-9 Score 4 6 3 4    Difficult doing work/chores Somewhat difficult Not difficult at all Not difficult at all Not difficult at all       Fall Risk:    03/13/2022    2:37 PM 02/04/2022    3:03 PM 10/16/2021     2:12 PM 08/08/2021    9:13 AM 11/19/2020    2:15 PM  Fall Risk   Falls in the past year? 0 0 0 1 0  Number falls in past yr: 0 0 0 1 0  Injury with Fall? 0 0 0 1 0  Risk for fall due to : No Fall Risks No Fall Risks No Fall Risks Impaired balance/gait;Orthopedic patient No Fall Risks  Follow up Falls evaluation completed Falls evaluation completed Falls evaluation completed Falls evaluation completed Falls evaluation completed      Functional Status Survey:      Assessment & Plan

## 2022-03-19 NOTE — Progress Notes (Deleted)
There were no vitals taken for this visit.   Subjective:    Patient ID: Dean Clements, male    DOB: [birth year], 56 y.o.   MRN: [id number]  HPI: Dean Clements is a 56 y.o. male  No chief complaint on file.  HYPERTENSION / HYPERLIPIDEMIA Satisfied with current treatment? {Blank single:19197::"yes","no"} Duration of hypertension: {Blank single:19197::"chronic","months","years"} BP monitoring frequency: {Blank single:19197::"not checking","rarely","daily","weekly","monthly","a few times a day","a few times a week","a few times a month"} BP range:  BP medication side effects: {Blank single:19197::"yes","no"} Past BP meds: {Blank EQFDVOUZ:14604::"NVVY","XAJLUNGBMB","OMQTTCNGFR/EVQWQVLDKC","CQFJUVQQ","UIVHOYWVXU","CJARWPTYYP/EJYL","TEIHDTPNSQ (bystolic)","carvedilol","chlorthalidone","clonidine","diltiazem","exforge HCT","HCTZ","irbesartan (avapro)","labetalol","lisinopril","lisinopril-HCTZ","losartan (cozaar)","methyldopa","nifedipine","olmesartan (benicar)","olmesartan-HCTZ","quinapril","ramipril","spironalactone","tekturna","valsartan","valsartan-HCTZ","verapamil"} Duration of hyperlipidemia: {Blank single:19197::"chronic","months","years"} Cholesterol medication side effects: {Blank single:19197::"yes","no"} Cholesterol supplements: {Blank multiple:19196::"none","fish oil","niacin","red yeast rice"} Past cholesterol medications: {Blank multiple:19196::"none","atorvastain (lipitor)","lovastatin (mevacor)","pravastatin (pravachol)","rosuvastatin (crestor)","simvastatin (zocor)","vytorin","fenofibrate (tricor)","gemfibrozil","ezetimide (zetia)","niaspan","lovaza"} Medication compliance: {Blank single:19197::"excellent compliance","good compliance","fair compliance","poor compliance"} Aspirin: {Blank single:19197::"yes","no"} Recent stressors: {Blank single:19197::"yes","no"} Recurrent headaches: {Blank single:19197::"yes","no"} Visual changes: {Blank  single:19197::"yes","no"} Palpitations: {Blank single:19197::"yes","no"} Dyspnea: {Blank single:19197::"yes","no"} Chest pain: {Blank single:19197::"yes","no"} Lower extremity edema: {Blank single:19197::"yes","no"} Dizzy/lightheaded: {Blank single:19197::"yes","no"}  DIABETES Hypoglycemic episodes:{Blank single:19197::"yes","no"} Polydipsia/polyuria: {Blank single:19197::"yes","no"} Visual disturbance: {Blank single:19197::"yes","no"} Chest pain: {Blank single:19197::"yes","no"} Paresthesias: {Blank single:19197::"yes","no"} Glucose Monitoring: {Blank single:19197::"yes","no"}  Accucheck frequency: {Blank single:19197::"Not Checking","Daily","BID","TID"}  Fasting glucose:  Post prandial:  Evening:  Before meals: Taking Insulin?: {Blank single:19197::"yes","no"}  Long acting insulin:  Short acting insulin: Blood Pressure Monitoring: {Blank single:19197::"not checking","rarely","daily","weekly","monthly","a few times a day","a few times a week","a few times a month"} Retinal Examination: {Blank single:19197::"Up to Date","Not up to Date"} Foot Exam: {Blank single:19197::"Up to Date","Not up to Date"} Diabetic Education: {Blank single:19197::"Completed","Not Completed"} Pneumovax: {Blank single:19197::"Up to Date","Not up to Date","unknown"} Influenza: {Blank single:19197::"Up to Date","Not up to Date","unknown"} Aspirin: {Blank single:19197::"yes","no"}  COPD COPD status: {Blank single:19197::"controlled","uncontrolled","better","worse","exacerbated","stable"} Satisfied with current treatment?: {Blank single:19197::"yes","no"} Oxygen use: {Blank single:19197::"yes","no"} Dyspnea frequency:  Cough frequency:  Rescue inhaler frequency:   Limitation of activity: {Blank single:19197::"yes","no"} Productive cough:  Last Spirometry:  Pneumovax: {Blank single:19197::"Up to Date","Not up to Date","unknown"} Influenza: {Blank single:19197::"Up to Date","Not up to  Date","unknown"}  BACK PAIN Duration: {Blank single:19197::"days","weeks","months"} Mechanism of injury: {Blank single:19197::"lifting","MVA","no trauma","unknown"} Location: {Blank multiple:19196::"Right","Left","R>L","L>R","midline","bilateral","low back","upper back"} Onset: {Blank single:19197::"sudden","gradual"} Severity: {Blank single:19197::"mild","moderate","severe","1/10","2/10","3/10","4/10","5/10","6/10","7/10","8/10","9/10","10/10"} Quality: {Blank multiple:19196::"sharp","dull","aching","burning","cramping","ill-defined","itchy","pressure-like","pulling","shooting","sore","stabbing","tender","tearing","throbbing"} Frequency: {Blank single:19197::"constant","intermittent","occasional","rare","every few minutes","a few times a hour","a few times a day","a few times a week","a few times a month","a few times a year"} Radiation: {Blank multiple:19196::"none","buttocks","R leg below the knee","R leg above the knee","L leg below the knee","L leg above the knee"} Aggravating factors: {Blank multiple:19196::"none","lifting","movement","walking","laying","bending","prolonged sitting","coughing","valsalva","Pain increased with coughing/valsalva"} Alleviating factors: {Blank multiple:19196::"nothing","rest","ice","heat","laying","NSAIDs","APAP","narcotics","muscle relaxer"} Status: {Blank multiple:19196::"better","worse","stable","fluctuating"} Treatments attempted: {Blank multiple:19196::"none","rest","ice","heat","APAP","ibuprofen","aleve","physical therapy","HEP","OMM"}  Relief with NSAIDs?: {Blank single:19197::"No NSAIDs Taken","no","mild","moderate","significant"} Nighttime pain:  {Blank single:19197::"yes","no"} Paresthesias / decreased sensation:  {Blank single:19197::"yes","no"} Bowel / bladder incontinence:  {Blank single:19197::"yes","no"} Fevers:  {Blank single:19197::"yes","no"} Dysuria / urinary frequency:  {Blank single:19197::"yes","no"}  Relevant past medical, surgical,  family and social history reviewed and updated as indicated. Interim medical history since our last visit reviewed. Allergies and medications reviewed and updated.  Review of Systems  Per HPI unless specifically indicated above     Objective:    There were no vitals taken for this visit.  Wt Readings from Last 3 Encounters:  03/13/22 230 lb (104.3 kg)  02/04/22 231 lb 1.6 oz (104.8 kg)  11/06/21 220 lb 12.8 oz (100.2 kg)    Physical Exam  Results for orders placed or performed in visit on 03/13/22  HgB A1c  Result Value Ref Range   Hgb A1c MFr Bld 6.6 (H) 4.8 - 5.6 %   Est. average glucose Bld gHb Est-mCnc 143 mg/dL  Comp Met (CMET)  Result Value Ref Range   Glucose 112 (H) 70 - 99 mg/dL   BUN 14 6 - 24 mg/dL   Creatinine, Ser 0.98 0.76 - 1.27 mg/dL   eGFR 91 >59 mL/min/1.73   BUN/Creatinine Ratio 14 9 - 20   Sodium 139 134 - 144 mmol/L   Potassium 4.7 3.5 - 5.2 mmol/L   Chloride 99 96 - 106 mmol/L   CO2 21 20 - 29 mmol/L   Calcium 9.4 8.7 - 10.2 mg/dL   Total Protein 6.5 6.0 - 8.5 g/dL   Albumin 4.4 3.8 - 4.9 g/dL   Globulin, Total 2.1 1.5 - 4.5 g/dL   Albumin/Globulin Ratio 2.1 1.2 - 2.2   Bilirubin Total 0.4 0.0 - 1.2 mg/dL   Alkaline Phosphatase 74 44 - 121 IU/L   AST 24 0 - 40 IU/L   ALT 22 0 - 44 IU/L  CBC w/Diff  Result Value Ref Range   WBC 10.2 3.4 - 10.8 x10E3/uL   RBC 5.36 4.14 - 5.80 x10E6/uL   Hemoglobin 16.2 13.0 - 17.7 g/dL   Hematocrit 47.3 37.5 - 51.0 %   MCV 88 79 - 97 fL   MCH 30.2 26.6 - 33.0 pg   MCHC 34.2 31.5 - 35.7 g/dL   RDW 12.8 11.6 - 15.4 %   Platelets 329 150 - 450 x10E3/uL   Neutrophils 57 Not Estab. %   Lymphs 29 Not Estab. %   Monocytes 9 Not Estab. %   Eos 3 Not Estab. %   Basos 1 Not Estab. %   Neutrophils Absolute 5.9 1.4 - 7.0 x10E3/uL   Lymphocytes Absolute 3.0 0.7 - 3.1 x10E3/uL   Monocytes Absolute 0.9 0.1 - 0.9 x10E3/uL   EOS (ABSOLUTE) 0.3 0.0 - 0.4 x10E3/uL   Basophils Absolute 0.1 0.0 - 0.2 x10E3/uL   Immature  Granulocytes 1 Not Estab. %   Immature Grans (Abs) 0.1 0.0 - 0.1 x10E3/uL  Lipid Profile  Result Value Ref Range   Cholesterol, Total 234 (H) 100 - 199 mg/dL   Triglycerides 196 (H) 0 - 149 mg/dL   HDL 47 >39 mg/dL   VLDL Cholesterol Cal 36 5 - 40 mg/dL   LDL Chol Calc (NIH) 151 (H) 0 - 99 mg/dL   Chol/HDL Ratio 5.0 0.0 - 5.0 ratio      Assessment & Plan:   Problem List Items Addressed This Visit   None    Follow up plan: No follow-ups on file.

## 2022-03-20 ENCOUNTER — Ambulatory Visit: Payer: Medicaid Other | Admitting: Nurse Practitioner

## 2022-05-28 ENCOUNTER — Other Ambulatory Visit: Payer: Self-pay | Admitting: Family Medicine

## 2022-06-12 NOTE — Progress Notes (Deleted)
There were no vitals taken for this visit.   Subjective:    Patient ID: Dean Clements, male    DOB: 04-21-1966, 57 y.o.   MRN: [id number]  HPI: Dean Clements is a 57 y.o. male  No chief complaint on file.  HYPERTENSION / HYPERLIPIDEMIA Satisfied with current treatment? {Blank single:19197::"yes","no"} Duration of hypertension: {Blank single:19197::"chronic","months","years"} BP monitoring frequency: {Blank single:19197::"not checking","rarely","daily","weekly","monthly","a few times a day","a few times a week","a few times a month"} BP range:  BP medication side effects: {Blank single:19197::"yes","no"} Past BP meds: {Blank WUJWJXBJ:47829::"FAOZ","HYQMVHQION","GEXBMWUXLK/GMWNUUVOZD","GUYQIHKV","QQVZDGLOVF","IEPPIRJJOA/CZYS","AYTKZSWFUX (bystolic)","carvedilol","chlorthalidone","clonidine","diltiazem","exforge HCT","HCTZ","irbesartan (avapro)","labetalol","lisinopril","lisinopril-HCTZ","losartan (cozaar)","methyldopa","nifedipine","olmesartan (benicar)","olmesartan-HCTZ","quinapril","ramipril","spironalactone","tekturna","valsartan","valsartan-HCTZ","verapamil"} Duration of hyperlipidemia: {Blank single:19197::"chronic","months","years"} Cholesterol medication side effects: {Blank single:19197::"yes","no"} Cholesterol supplements: {Blank multiple:19196::"none","fish oil","niacin","red yeast rice"} Past cholesterol medications: {Blank multiple:19196::"none","atorvastain (lipitor)","lovastatin (mevacor)","pravastatin (pravachol)","rosuvastatin (crestor)","simvastatin (zocor)","vytorin","fenofibrate (tricor)","gemfibrozil","ezetimide (zetia)","niaspan","lovaza"} Medication compliance: {Blank single:19197::"excellent compliance","good compliance","fair compliance","poor compliance"} Aspirin: {Blank single:19197::"yes","no"} Recent stressors: {Blank single:19197::"yes","no"} Recurrent headaches: {Blank single:19197::"yes","no"} Visual changes: {Blank  single:19197::"yes","no"} Palpitations: {Blank single:19197::"yes","no"} Dyspnea: {Blank single:19197::"yes","no"} Chest pain: {Blank single:19197::"yes","no"} Lower extremity edema: {Blank single:19197::"yes","no"} Dizzy/lightheaded: {Blank single:19197::"yes","no"}  DIABETES Hypoglycemic episodes:{Blank single:19197::"yes","no"} Polydipsia/polyuria: {Blank single:19197::"yes","no"} Visual disturbance: {Blank single:19197::"yes","no"} Chest pain: {Blank single:19197::"yes","no"} Paresthesias: {Blank single:19197::"yes","no"} Glucose Monitoring: {Blank single:19197::"yes","no"}  Accucheck frequency: {Blank single:19197::"Not Checking","Daily","BID","TID"}  Fasting glucose:  Post prandial:  Evening:  Before meals: Taking Insulin?: {Blank single:19197::"yes","no"}  Long acting insulin:  Short acting insulin: Blood Pressure Monitoring: {Blank single:19197::"not checking","rarely","daily","weekly","monthly","a few times a day","a few times a week","a few times a month"} Retinal Examination: {Blank single:19197::"Up to Date","Not up to Date"} Foot Exam: {Blank single:19197::"Up to Date","Not up to Date"} Diabetic Education: {Blank single:19197::"Completed","Not Completed"} Pneumovax: {Blank single:19197::"Up to Date","Not up to Date","unknown"} Influenza: {Blank single:19197::"Up to Date","Not up to Date","unknown"} Aspirin: {Blank single:19197::"yes","no"}  COPD COPD status: {Blank single:19197::"controlled","uncontrolled","better","worse","exacerbated","stable"} Satisfied with current treatment?: {Blank single:19197::"yes","no"} Oxygen use: {Blank single:19197::"yes","no"} Dyspnea frequency:  Cough frequency:  Rescue inhaler frequency:   Limitation of activity: {Blank single:19197::"yes","no"} Productive cough:  Last Spirometry:  Pneumovax: {Blank single:19197::"Up to Date","Not up to Date","unknown"} Influenza: {Blank single:19197::"Up to Date","Not up to  Date","unknown"}     Relevant past medical, surgical, family and social history reviewed and updated as indicated. Interim medical history since our last visit reviewed. Allergies and medications reviewed and updated.  Review of Systems  Per HPI unless specifically indicated above     Objective:    There were no vitals taken for this visit.  Wt Readings from Last 3 Encounters:  03/13/22 230 lb (104.3 kg)  02/04/22 231 lb 1.6 oz (104.8 kg)  11/06/21 220 lb 12.8 oz (100.2 kg)    Physical Exam  Results for orders placed or performed in visit on 03/13/22  HgB A1c  Result Value Ref Range   Hgb A1c MFr Bld 6.6 (H) 4.8 - 5.6 %   Est. average glucose Bld gHb Est-mCnc 143 mg/dL  Comp Met (CMET)  Result Value Ref Range   Glucose 112 (H) 70 - 99 mg/dL   BUN 14 6 - 24 mg/dL   Creatinine, Ser 0.98 0.76 - 1.27 mg/dL   eGFR 91 >59 mL/min/1.73   BUN/Creatinine Ratio 14 9 - 20   Sodium 139 134 - 144 mmol/L   Potassium 4.7 3.5 - 5.2 mmol/L   Chloride 99 96 - 106 mmol/L   CO2 21 20 - 29 mmol/L   Calcium 9.4 8.7 - 10.2 mg/dL   Total Protein 6.5 6.0 - 8.5 g/dL   Albumin 4.4 3.8 - 4.9 g/dL   Globulin, Total 2.1 1.5 - 4.5 g/dL   Albumin/Globulin Ratio 2.1 1.2 - 2.2   Bilirubin Total 0.4  0.0 - 1.2 mg/dL   Alkaline Phosphatase 74 44 - 121 IU/L   AST 24 0 - 40 IU/L   ALT 22 0 - 44 IU/L  CBC w/Diff  Result Value Ref Range   WBC 10.2 3.4 - 10.8 x10E3/uL   RBC 5.36 4.14 - 5.80 x10E6/uL   Hemoglobin 16.2 13.0 - 17.7 g/dL   Hematocrit 47.3 37.5 - 51.0 %   MCV 88 79 - 97 fL   MCH 30.2 26.6 - 33.0 pg   MCHC 34.2 31.5 - 35.7 g/dL   RDW 12.8 11.6 - 15.4 %   Platelets 329 150 - 450 x10E3/uL   Neutrophils 57 Not Estab. %   Lymphs 29 Not Estab. %   Monocytes 9 Not Estab. %   Eos 3 Not Estab. %   Basos 1 Not Estab. %   Neutrophils Absolute 5.9 1.4 - 7.0 x10E3/uL   Lymphocytes Absolute 3.0 0.7 - 3.1 x10E3/uL   Monocytes Absolute 0.9 0.1 - 0.9 x10E3/uL   EOS (ABSOLUTE) 0.3 0.0 - 0.4  x10E3/uL   Basophils Absolute 0.1 0.0 - 0.2 x10E3/uL   Immature Granulocytes 1 Not Estab. %   Immature Grans (Abs) 0.1 0.0 - 0.1 x10E3/uL  Lipid Profile  Result Value Ref Range   Cholesterol, Total 234 (H) 100 - 199 mg/dL   Triglycerides 196 (H) 0 - 149 mg/dL   HDL 47 >39 mg/dL   VLDL Cholesterol Cal 36 5 - 40 mg/dL   LDL Chol Calc (NIH) 151 (H) 0 - 99 mg/dL   Chol/HDL Ratio 5.0 0.0 - 5.0 ratio      Assessment & Plan:   Problem List Items Addressed This Visit       Cardiovascular and Mediastinum   Essential hypertension - Primary     Respiratory   Chronic obstructive pulmonary disease ([place])     Endocrine   Type 2 diabetes mellitus with diabetic neuropathy ([facility])   Hyperlipidemia associated with type 2 diabetes mellitus ([place])     Other   Obesity (BMI 30.0-34.9)     Follow up plan: No follow-ups on file.

## 2022-06-13 ENCOUNTER — Ambulatory Visit: Payer: Medicaid Other | Admitting: Nurse Practitioner

## 2022-06-13 DIAGNOSIS — E1140 Type 2 diabetes mellitus with diabetic neuropathy, unspecified: Secondary | ICD-10-CM

## 2022-06-13 DIAGNOSIS — E785 Hyperlipidemia, unspecified: Secondary | ICD-10-CM

## 2022-06-13 DIAGNOSIS — I10 Essential (primary) hypertension: Secondary | ICD-10-CM

## 2022-06-13 DIAGNOSIS — E669 Obesity, unspecified: Secondary | ICD-10-CM

## 2022-06-13 DIAGNOSIS — J449 Chronic obstructive pulmonary disease, unspecified: Secondary | ICD-10-CM

## 2022-06-23 IMAGING — XA Imaging study
2 series · 2 of 2 positions shown · non-contrast
Comparison: none

CLINICAL DATA: Right L5-S1 disc protrusion. Right S1 radiculopathy.

[Series 1: ortho adipose · 1 of 1 slices shown (1 of 2)]
[im 1/1]
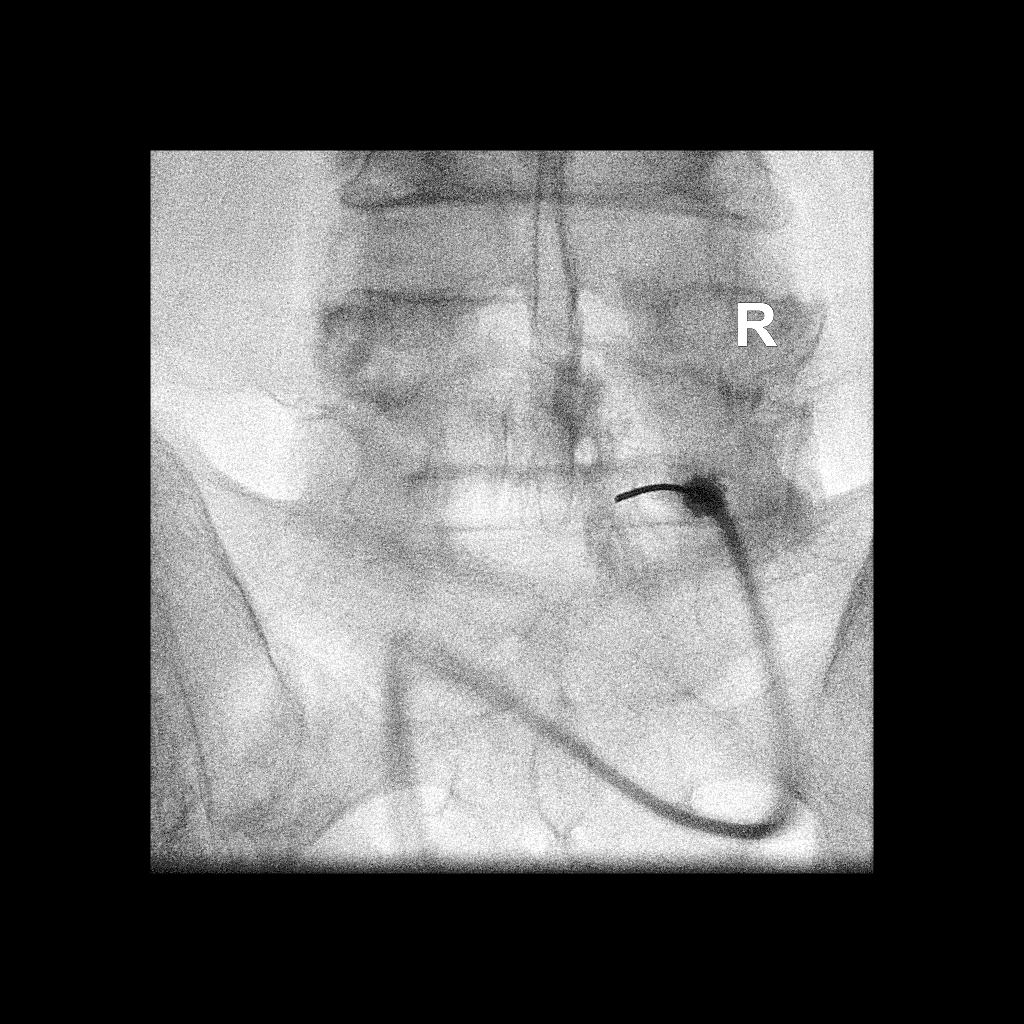

[Series 2: ortho adipose · 1 of 1 slices shown (2 of 2)]
[im 1/1]
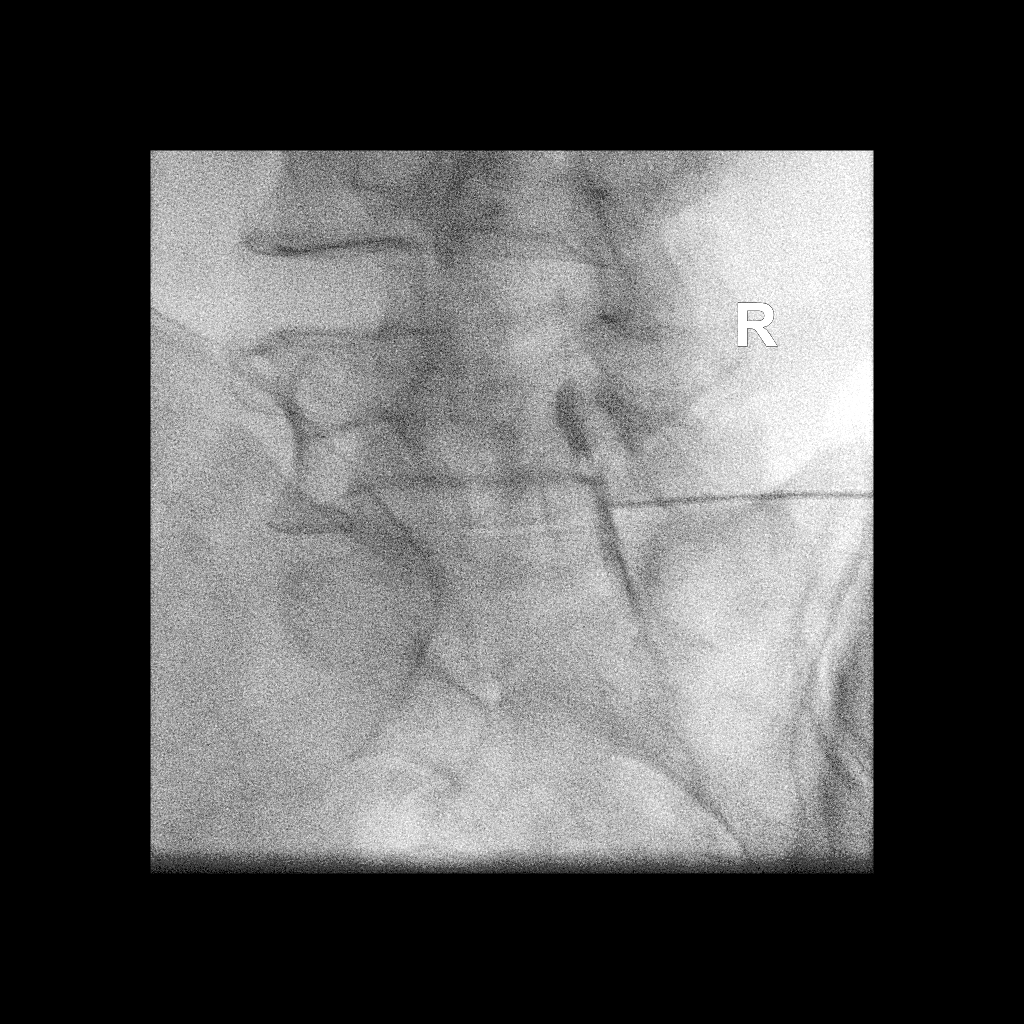

[2 of 2 positions shown; findings below may reference images not displayed]

FLUOROSCOPY TIME:  Radiation Exposure Index (as provided by the
fluoroscopic device): 31.43 uGy*m2

PROCEDURE:
The procedure, risks, benefits, and alternatives were explained to
the patient. Questions regarding the procedure were encouraged and
answered. The patient understands and consents to the procedure.

LUMBAR EPIDURAL INJECTION:

An interlaminar approach was performed on right at L5-S1. The
overlying skin was cleansed and anesthetized. A 20 gauge epidural
needle was advanced using loss-of-resistance technique.

DIAGNOSTIC EPIDURAL INJECTION:

Injection of Isovue-M 200 shows a good epidural pattern with spread
above and below the level of needle placement, primarily on the
right no vascular opacification is seen.

THERAPEUTIC EPIDURAL INJECTION:

120 mg of Depo-Medrol mixed with 3 mL 1% lidocaine were instilled.
The procedure was well-tolerated, and the patient was discharged
thirty minutes following the injection in good condition.

COMPLICATIONS:
None
IMPRESSION: Technically successful epidural injection on the right L5-S1 # 1

## 2022-08-06 ENCOUNTER — Other Ambulatory Visit: Payer: Self-pay | Admitting: Nurse Practitioner

## 2022-08-06 DIAGNOSIS — K219 Gastro-esophageal reflux disease without esophagitis: Secondary | ICD-10-CM

## 2022-08-06 NOTE — Telephone Encounter (Signed)
Medication Refill - Medication: pantoprazole (PROTONIX) 40 MG tablet   Has the patient contacted their pharmacy? Yes.   Pharmacy system is down, prescription will need to be called into the pharmacy.    Preferred Pharmacy (with phone number or street name):  [place], [place]. Phone: [phone]  Fax: [phone]     Has the patient been seen for an appointment in the last year OR does the patient have an upcoming appointment? Yes.    Agent: Please be advised that RX refills may take up to 3 business days. We ask that you follow-up with your pharmacy.

## 2022-08-07 MED ORDER — PANTOPRAZOLE SODIUM 40 MG PO TBEC: 40.0000 mg | DELAYED_RELEASE_TABLET | Freq: Every day | ORAL | 1 refills | Status: DC

## 2022-08-07 NOTE — Telephone Encounter (Signed)
Requested Prescriptions  Pending Prescriptions Disp Refills   pantoprazole (PROTONIX) 40 MG tablet 90 tablet 1    Sig: Take 1 tablet (40 mg total) by mouth daily.     Gastroenterology: Proton Pump Inhibitors Passed - 08/06/2022  4:21 PM      Passed - Valid encounter within last 12 months    Recent Outpatient Visits           4 months ago Chronic right-sided back pain, unspecified back location   [place], PA-C   6 months ago [place], NP   9 months ago Chronic obstructive pulmonary disease, unspecified COPD type ([place])   [place] [facility] Vigg, Avanti, MD   9 months ago Chronic cough   [place] [facility] Vigg, Avanti, MD   12 months ago Chronic right-sided back pain, unspecified back location    [facility] Charlynne Cousins, MD

## 2022-12-19 ENCOUNTER — Other Ambulatory Visit: Payer: Self-pay | Admitting: Family Medicine

## 2022-12-22 NOTE — Telephone Encounter (Signed)
Patient will need an office visit for further refills. Requested Prescriptions  Pending Prescriptions Disp Refills   gabapentin (NEURONTIN) 300 MG capsule [Pharmacy Med Name: GABAPENTIN 300 MG CAP] 180 capsule 0    Sig: TAKE 1 CAPSULE BY MOUTH TWICE DAILY     Neurology: Anticonvulsants - gabapentin Passed - 12/19/2022  6:50 PM      Passed - Cr in normal range and within 360 days    Creat  Date Value Ref Range Status  03/14/2020 0.83 0.70 - 1.33 mg/dL Final    Comment:    For patients >86 years of age, the reference limit for Creatinine is approximately 13% higher for people identified as African-American. .    Creatinine, Ser  Date Value Ref Range Status  03/13/2022 0.98 0.76 - 1.27 mg/dL Final   Creatinine, Urine  Date Value Ref Range Status  03/04/2018 244 mg/dL Final  45/40/9811 914 mg/dL Final    Comment:    Performed at [facility], [address] [place], [place] [address]         Passed - Completed PHQ-2 or PHQ-9 in the last 360 days      Passed - Valid encounter within last 12 months    Recent Outpatient Visits           9 months ago Chronic right-sided back pain, unspecified back location   [place] [facility] Mecum, Oswaldo Conroy, PA-C   10 months ago Folliculitis   [place] [facility] [place], Clydie Braun, NP   1 year ago Chronic obstructive pulmonary disease, unspecified COPD type (HCC)   [place] [facility] Vigg, Avanti, MD   1 year ago Chronic cough   [place] [facility] Vigg, Avanti, MD   1 year ago Chronic right-sided back pain, unspecified back location   [place] [facility] Loura Pardon, MD

## 2023-01-01 ENCOUNTER — Other Ambulatory Visit: Payer: Self-pay | Admitting: Physician Assistant

## 2023-01-01 DIAGNOSIS — G8929 Other chronic pain: Secondary | ICD-10-CM

## 2023-01-02 NOTE — Telephone Encounter (Signed)
Patient is overdue for an appointment. Please call to schedule.  ?

## 2023-01-02 NOTE — Telephone Encounter (Signed)
Requested medication (s) are due for refill today - yes  Requested medication (s) are on the active medication list -yes  Future visit scheduled -no  Last refill: 03/14/23 #30  Notes to clinic: non delegated Rx  Requested Prescriptions  Pending Prescriptions Disp Refills   cyclobenzaprine (FLEXERIL) 10 MG tablet [Pharmacy Med Name: CYCLOBENZAPRINE HCL 10 MG TAB] 180 tablet     Sig: TAKE 1 TABLET BY MOUTH TWICE DAILY     Not Delegated - Analgesics:  Muscle Relaxants Failed - 01/01/2023  4:16 PM      Failed - This refill cannot be delegated      Failed - Valid encounter within last 6 months    Recent Outpatient Visits           9 months ago Chronic right-sided back pain, unspecified back location   [place] [facility] Mecum, Oswaldo Conroy, PA-C   11 months ago Folliculitis   [place] [facility] [place], Clydie Braun, NP   1 year ago Chronic obstructive pulmonary disease, unspecified COPD type (HCC)   [place] [facility] Vigg, Avanti, MD   1 year ago Chronic cough   [place] [facility] Vigg, Avanti, MD   1 year ago Chronic right-sided back pain, unspecified back location   [place] [facility] Vigg, Avanti, MD                 Requested Prescriptions  Pending Prescriptions Disp Refills   cyclobenzaprine (FLEXERIL) 10 MG tablet [Pharmacy Med Name: CYCLOBENZAPRINE HCL 10 MG TAB] 180 tablet     Sig: TAKE 1 TABLET BY MOUTH TWICE DAILY     Not Delegated - Analgesics:  Muscle Relaxants Failed - 01/01/2023  4:16 PM      Failed - This refill cannot be delegated      Failed - Valid encounter within last 6 months    Recent Outpatient Visits           9 months ago Chronic right-sided back pain, unspecified back location   [place] [facility] Mecum, Oswaldo Conroy, PA-C   11 months ago Folliculitis   [place] [facility] [place], Clydie Braun, NP   1 year ago Chronic obstructive  pulmonary disease, unspecified COPD type (HCC)   [place] [facility] Vigg, Avanti, MD   1 year ago Chronic cough   [place] [facility] Vigg, Avanti, MD   1 year ago Chronic right-sided back pain, unspecified back location   [place] [facility] Loura Pardon, MD

## 2023-01-05 NOTE — Telephone Encounter (Signed)
Attempted to reach patient, LVM to call office back to schedule follow up visit.  Put in CRM.

## 2023-01-06 NOTE — Telephone Encounter (Signed)
Scheduled appointment for 01/16/2023 @ 2:20 pm.

## 2023-01-12 ENCOUNTER — Ambulatory Visit
Admission: EM | Admit: 2023-01-12 | Discharge: 2023-01-12 | Disposition: A | Discharge status: Home / Self Care | Payer: Medicaid Other | Attending: Emergency Medicine | Admitting: Emergency Medicine

## 2023-01-12 DIAGNOSIS — M5441 Lumbago with sciatica, right side: Secondary | ICD-10-CM

## 2023-01-12 MED ORDER — BACLOFEN 10 MG PO TABS: 10.0000 mg | ORAL_TABLET | Freq: Three times a day (TID) | ORAL | 0 refills | Status: DC

## 2023-01-12 MED ORDER — PREDNISONE 10 MG (21) PO TBPK: ORAL_TABLET | ORAL | 0 refills | Status: DC

## 2023-01-12 MED ORDER — DEXAMETHASONE SODIUM PHOSPHATE 10 MG/ML IJ SOLN
10.0000 mg | Freq: Once | INTRAMUSCULAR | Status: AC
  Administered 2023-01-12: 10 mg via INTRAMUSCULAR

## 2023-01-12 NOTE — Discharge Instructions (Addendum)
Take the prednisone according to the package instructions.  You will take it each morning with breakfast.  Make sure that you are always taking it with food.  Take the baclofen every 8 hours as needed for muscle spasm.  Follow the rehabilitation exercises in your discharge paperwork to help you with your sciatic pain.  Return for reevaluation for new or worsening symptoms, or see your primary care provider.  

## 2023-01-12 NOTE — ED Triage Notes (Signed)
Back and leg pain. Sciatica pain on right side.

## 2023-01-12 NOTE — ED Provider Notes (Signed)
[facility]    CSN: [id number] Arrival date & time: 01/12/23  1511      History   Chief Complaint Chief Complaint  Patient presents with   Leg Pain    HPI Oaklan Serven is a 57 y.o. male.   HPI  57 year old male with a past medical history significant for hypertension, hyperlipidemia, GERD, type 2 diabetes, and depression presents for evaluation of right-sided low back pain with irritation of his right-sided sciatica.  He reports that the symptoms began 4 days ago and then were aggravated by over doing yard work over the weekend.  He reports that he has not had an issue since he underwent back surgery several years ago but he attributes the yard work as being the causative agent.  No numbness, tingling, or weakness in his lower extremities.  Past Medical History:  Diagnosis Date   Depression    Diabetes mellitus without complication (HCC)    type 2   GERD (gastroesophageal reflux disease)    Hyperlipidemia    Hypertension    Low back pain 2004   occasional back flares each year.     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   Polyneuropathy 03/12/2020   Herniated lumbar disc without myelopathy 02/02/2020   Carpal tunnel syndrome 12/23/2019   Lumbago with sciatica, unspecified side 12/23/2019   Neuropathy 12/23/2019   Osteoarthritis of spine with radiculopathy, cervical region 12/09/2019   Osteoarthritis of spine with radiculopathy, lumbar region 09/07/2019   Background diabetic retinopathy (HCC) 05/30/2019   Gout 06/14/2018   Hyperlipidemia associated with type 2  diabetes mellitus (HCC) 06/14/2018   Chronic bilateral back pain 06/04/2016   Type 2 diabetes mellitus with diabetic neuropathy (HCC) 09/10/2015   Essential hypertension 04/03/2015   Anxiety 04/03/2015   GERD (gastroesophageal reflux disease) 01/15/2015   Insomnia 01/15/2015   Obesity (BMI 30.0-34.9) 01/15/2015    Past Surgical History:  Procedure Laterality Date   LUMBAR LAMINECTOMY/DECOMPRESSION MICRODISCECTOMY Bilateral 07/27/2020   Procedure: Laminectomy Lumbar four-Lumbar five - bilateral, Microdiscectomy - Lumbar five-Sacral one - right;  Surgeon: Donalee Citrin, MD;  Location: [facility];  Service: Neurosurgery;  Laterality: Bilateral;   none     TONSILLECTOMY     as a child   WISDOM TOOTH EXTRACTION     as a teenager       Home Medications    Prior to Admission medications   Medication Sig Start Date End Date Taking? Authorizing Provider  baclofen (LIORESAL) 10 MG tablet Take 1 tablet (10 mg total) by mouth 3 (three) times daily. 01/12/23  Yes Becky Augusta, NP  predniSONE (STERAPRED UNI-PAK 21 TAB) 10 MG (21) TBPK tablet Take 6 tablets on day 1, 5 tablets day 2, 4 tablets day 3, 3 tablets day 4, 2 tablets day 5, 1 tablet day 6 01/12/23  Yes Becky Augusta, NP  Accu-Chek Softclix Lancets lancets SMARTSIG:Topical 11/22/20   [provider]  albuterol (VENTOLIN HFA) 108 (90 Base) MCG/ACT inhaler Inhale 2 puffs into the lungs every 6 (six) hours as needed for wheezing or shortness of breath. 11/06/21   Loura Pardon, MD  fexofenadine Joyce Copa  ALLERGY) 180 MG tablet Take 1 tablet (180 mg total) by mouth daily. Patient not taking: Reported on 02/04/2022 10/16/21   Vigg, Roma Schanz, MD  fluticasone furoate-vilanterol (BREO ELLIPTA) 100-25 MCG/ACT AEPB Inhale 1 puff into the lungs daily. 11/06/21   Vigg, Avanti, MD  gabapentin (NEURONTIN) 300 MG capsule TAKE 1 CAPSULE BY MOUTH TWICE DAILY 12/22/22   Larae Grooms, NP  losartan (COZAAR) 25 MG tablet Take 1 tablet (25 mg total) by mouth daily.  07/04/21   Johnson, Megan P, DO  Tiotropium Bromide Monohydrate (SPIRIVA RESPIMAT) 2.5 MCG/ACT AERS Inhale 2 puffs into the lungs daily. 10/16/21   Vigg, Avanti, MD  traZODone (DESYREL) 50 MG tablet TAKE 1/2-1 TABLET BY MOUTH AT BEDTIME ASNEEDED FOR SLEEP 10/09/21   Vigg, Avanti, MD    Family History Family History  Problem Relation Age of Onset   Cancer Mother    Heart disease Father    Diabetes Father    Hypertension Father     Social History Social History   Tobacco Use   Smoking status: Former    Current packs/day: 0.20    Average packs/day: 0.2 packs/day for 3.0 years (0.6 ttl pk-yrs)    Types: Cigarettes   Smokeless tobacco: Current  Vaping Use   Vaping status: Never Used  Substance Use Topics   Alcohol use: Yes    Alcohol/week: 12.0 standard drinks of alcohol    Types: 12 Cans of beer per week    Comment: weekly   Drug use: Not Currently    Types: Marijuana    Comment: 3 times a week     Allergies   Patient has no known allergies.   Review of Systems Review of Systems  Musculoskeletal:  Positive for back pain.  Neurological:  Negative for weakness and numbness.     Physical Exam Triage Vital Signs ED Triage Vitals [01/12/23 1520]  Encounter Vitals Group     BP (!) 157/77     Systolic BP Percentile      Diastolic BP Percentile      Pulse Rate 88     Resp 17     Temp 98.5 F (36.9 C)     Temp Source Oral     SpO2 98 %     Weight      Height      Head Circumference      Peak Flow      Pain Score      Pain Loc      Pain Education      Exclude from Growth Chart    No data found.  Updated Vital Signs BP (!) 157/77 (BP Location: Left Arm)   Pulse 88   Temp 98.5 F (36.9 C) (Oral)   Resp 17   SpO2 98%   Visual Acuity Right Eye Distance:   Left Eye Distance:   Bilateral Distance:    Right Eye Near:   Left Eye Near:    Bilateral Near:     Physical Exam Vitals and nursing note reviewed.  Constitutional:      Appearance: Normal  appearance. He is not ill-appearing.  HENT:     Head: Normocephalic and atraumatic.  Musculoskeletal:        General: Tenderness present. No swelling.  Skin:    General: Skin is warm and dry.     Capillary Refill: Capillary refill takes less than 2 seconds.  Neurological:     General: No focal deficit present.     Mental Status: He  is alert and oriented to person, place, and time.     Motor: No weakness.  Psychiatric:        Mood and Affect: Mood normal.        Behavior: Behavior normal.        Thought Content: Thought content normal.        Judgment: Judgment normal.      UC Treatments / Results  Labs (all labs ordered are listed, but only abnormal results are displayed) Labs Reviewed - No data to display  EKG   Radiology No results found.  Procedures Procedures (including critical care time)  Medications Ordered in UC Medications  dexamethasone (DECADRON) injection 10 mg (10 mg Intramuscular Given 01/12/23 1539)    Initial Impression / Assessment and Plan / UC Course  I have reviewed the triage vital signs and the nursing notes.  Pertinent labs & imaging results that were available during my care of the patient were reviewed by me and considered in my medical decision making (see chart for details).   Patient is a pleasant, nontoxic-appearing 57 year old male presenting for evaluation of right-sided low back pain with radiation to the right leg and activation of his previous right-sided sciatica.  Symptoms have been going on for the past 4 days.  On exam he has no midline spinous process tenderness or step-off in his lumbar spine.  There is mild tenderness and spasm in the right lower lumbar paraspinous region that extends down into the buttock along the path of the sciatic nerve.  Patient also has a positive straight leg raise on the right-hand side.  Negative contralateral straight leg raise on the left.  Bilateral lower extremities are 5/5.  I will treat the patient  for low back pain with sciatica with IM Decadron 10 mg here in clinic and discharged home on a prednisone taper along with baclofen 10 mg every 8 hours.  Home physical therapy exercises reviewed.  Return precautions also reviewed.  Work note provided.   Final Clinical Impressions(s) / UC Diagnoses   Final diagnoses:  Acute right-sided low back pain with right-sided sciatica     Discharge Instructions      Take the prednisone according to the package instructions.  You will take it each morning with breakfast.  Make sure that you are always taking it with food.  Take the baclofen every 8 hours as needed for muscle spasm.  Follow the rehabilitation exercises in your discharge paperwork to help you with your sciatic pain.  Return for reevaluation for new or worsening symptoms, or see your primary care provider.      ED Prescriptions     Medication Sig Dispense Auth. Provider   predniSONE (STERAPRED UNI-PAK 21 TAB) 10 MG (21) TBPK tablet Take 6 tablets on day 1, 5 tablets day 2, 4 tablets day 3, 3 tablets day 4, 2 tablets day 5, 1 tablet day 6 21 tablet Becky Augusta, NP   baclofen (LIORESAL) 10 MG tablet Take 1 tablet (10 mg total) by mouth 3 (three) times daily. 30 each Becky Augusta, NP      PDMP not reviewed this encounter.   Becky Augusta, NP 01/12/23 [phone]

## 2023-01-14 ENCOUNTER — Emergency Department: Payer: Medicaid Other

## 2023-01-14 ENCOUNTER — Other Ambulatory Visit: Payer: Self-pay

## 2023-01-14 ENCOUNTER — Encounter: Payer: Self-pay | Admitting: Internal Medicine

## 2023-01-14 ENCOUNTER — Observation Stay
Admission: EM | Admit: 2023-01-14 | Discharge: 2023-01-15 | Disposition: A | Discharge status: Home / Self Care | Payer: Medicaid Other | Attending: Internal Medicine | Admitting: Internal Medicine

## 2023-01-14 DIAGNOSIS — F418 Other specified anxiety disorders: Secondary | ICD-10-CM | POA: Diagnosis present

## 2023-01-14 DIAGNOSIS — E785 Hyperlipidemia, unspecified: Secondary | ICD-10-CM | POA: Insufficient documentation

## 2023-01-14 DIAGNOSIS — Z789 Other specified health status: Secondary | ICD-10-CM | POA: Diagnosis present

## 2023-01-14 DIAGNOSIS — Z794 Long term (current) use of insulin: Secondary | ICD-10-CM | POA: Diagnosis not present

## 2023-01-14 DIAGNOSIS — G9341 Metabolic encephalopathy: Secondary | ICD-10-CM | POA: Diagnosis not present

## 2023-01-14 DIAGNOSIS — R9082 White matter disease, unspecified: Secondary | ICD-10-CM | POA: Diagnosis not present

## 2023-01-14 DIAGNOSIS — R29818 Other symptoms and signs involving the nervous system: Secondary | ICD-10-CM | POA: Diagnosis not present

## 2023-01-14 DIAGNOSIS — I10 Essential (primary) hypertension: Secondary | ICD-10-CM | POA: Diagnosis not present

## 2023-01-14 DIAGNOSIS — G459 Transient cerebral ischemic attack, unspecified: Secondary | ICD-10-CM | POA: Diagnosis not present

## 2023-01-14 DIAGNOSIS — E1140 Type 2 diabetes mellitus with diabetic neuropathy, unspecified: Secondary | ICD-10-CM | POA: Diagnosis not present

## 2023-01-14 DIAGNOSIS — D72829 Elevated white blood cell count, unspecified: Secondary | ICD-10-CM | POA: Diagnosis present

## 2023-01-14 DIAGNOSIS — F1090 Alcohol use, unspecified, uncomplicated: Secondary | ICD-10-CM | POA: Diagnosis present

## 2023-01-14 DIAGNOSIS — E1169 Type 2 diabetes mellitus with other specified complication: Secondary | ICD-10-CM | POA: Diagnosis present

## 2023-01-14 DIAGNOSIS — E1165 Type 2 diabetes mellitus with hyperglycemia: Secondary | ICD-10-CM | POA: Insufficient documentation

## 2023-01-14 DIAGNOSIS — Z87891 Personal history of nicotine dependence: Secondary | ICD-10-CM | POA: Insufficient documentation

## 2023-01-14 DIAGNOSIS — I6529 Occlusion and stenosis of unspecified carotid artery: Secondary | ICD-10-CM | POA: Diagnosis not present

## 2023-01-14 DIAGNOSIS — G8929 Other chronic pain: Secondary | ICD-10-CM | POA: Diagnosis present

## 2023-01-14 DIAGNOSIS — R4182 Altered mental status, unspecified: Secondary | ICD-10-CM | POA: Diagnosis not present

## 2023-01-14 LAB — CBC WITH DIFFERENTIAL/PLATELET
Abs Immature Granulocytes: 0.07 10*3/uL (ref 0.00–0.07)
Basophils Absolute: 0 10*3/uL (ref 0.0–0.1)
Basophils Relative: 0 %
Eosinophils Absolute: 0 10*3/uL (ref 0.0–0.5)
Eosinophils Relative: 0 %
HCT: 43.7 % (ref 39.0–52.0)
Hemoglobin: 15 g/dL (ref 13.0–17.0)
Immature Granulocytes: 1 %
Lymphocytes Relative: 13 %
Lymphs Abs: 1.5 10*3/uL (ref 0.7–4.0)
MCH: 31 pg (ref 26.0–34.0)
MCHC: 34.3 g/dL (ref 30.0–36.0)
MCV: 90.3 fL (ref 80.0–100.0)
Monocytes Absolute: 0.8 10*3/uL (ref 0.1–1.0)
Monocytes Relative: 6 %
Neutro Abs: 9.4 10*3/uL — ABNORMAL HIGH (ref 1.7–7.7)
Neutrophils Relative %: 80 %
Platelets: 257 10*3/uL (ref 150–400)
RBC: 4.84 MIL/uL (ref 4.22–5.81)
RDW: 12.4 % (ref 11.5–15.5)
WBC: 11.8 10*3/uL — ABNORMAL HIGH (ref 4.0–10.5)
nRBC: 0 % (ref 0.0–0.2)

## 2023-01-14 LAB — HEMOGLOBIN A1C
Hgb A1c MFr Bld: 8 % — ABNORMAL HIGH (ref 4.8–5.6)
Mean Plasma Glucose: 182.9 mg/dL

## 2023-01-14 LAB — COMPREHENSIVE METABOLIC PANEL
ALT: 30 U/L (ref 0–44)
AST: 34 U/L (ref 15–41)
Albumin: 4.4 g/dL (ref 3.5–5.0)
Alkaline Phosphatase: 72 U/L (ref 38–126)
Anion gap: 8 (ref 5–15)
BUN: 18 mg/dL (ref 6–20)
CO2: 28 mmol/L (ref 22–32)
Calcium: 9.2 mg/dL (ref 8.9–10.3)
Chloride: 103 mmol/L (ref 98–111)
Creatinine, Ser: 0.93 mg/dL (ref 0.61–1.24)
GFR, Estimated: >60 mL/min (ref >60)
Glucose, Bld: 291 mg/dL — ABNORMAL HIGH (ref 70–99)
Potassium: 4 mmol/L (ref 3.5–5.1)
Sodium: 139 mmol/L (ref 135–145)
Total Bilirubin: 0.8 mg/dL (ref 0.3–1.2)
Total Protein: 7.6 g/dL (ref 6.5–8.1)

## 2023-01-14 LAB — ETHANOL: Alcohol, Ethyl (B): <10 mg/dL (ref <10)

## 2023-01-14 LAB — GLUCOSE, CAPILLARY: Glucose-Capillary: 242 mg/dL — ABNORMAL HIGH (ref 70–99)

## 2023-01-14 MED ORDER — INSULIN ASPART 100 UNIT/ML IJ SOLN
0.0000 [IU] | Freq: Every day | INTRAMUSCULAR | Status: DC
  Administered 2023-01-14: 2 [IU] via SUBCUTANEOUS
  Filled 2023-01-14: qty 1

## 2023-01-14 MED ORDER — ASPIRIN 325 MG PO TABS: 325.0000 mg | ORAL_TABLET | Freq: Every day | ORAL | Status: DC

## 2023-01-14 MED ORDER — ALBUTEROL SULFATE (2.5 MG/3ML) 0.083% IN NEBU: 2.5000 mg | INHALATION_SOLUTION | RESPIRATORY_TRACT | Status: DC | PRN

## 2023-01-14 MED ORDER — INSULIN ASPART 100 UNIT/ML IJ SOLN
0.0000 [IU] | Freq: Three times a day (TID) | INTRAMUSCULAR | Status: DC
  Administered 2023-01-15 (×2): 2 [IU] via SUBCUTANEOUS
  Filled 2023-01-14 (×2): qty 1

## 2023-01-14 MED ORDER — HYDRALAZINE HCL 20 MG/ML IJ SOLN: 5.0000 mg | INTRAMUSCULAR | Status: DC | PRN

## 2023-01-14 MED ORDER — THIAMINE HCL 100 MG/ML IJ SOLN: 100.0000 mg | Freq: Every day | INTRAMUSCULAR | Status: DC

## 2023-01-14 MED ORDER — SODIUM CHLORIDE 0.9 % IV BOLUS
1000.0000 mL | Freq: Once | INTRAVENOUS | Status: AC
  Administered 2023-01-14: 1000 mL via INTRAVENOUS

## 2023-01-14 MED ORDER — LORAZEPAM 1 MG PO TABS: 1.0000 mg | ORAL_TABLET | ORAL | Status: DC | PRN

## 2023-01-14 MED ORDER — ADULT MULTIVITAMIN W/MINERALS CH
1.0000 | ORAL_TABLET | Freq: Every day | ORAL | Status: DC
  Administered 2023-01-14 – 2023-01-15 (×2): 1 via ORAL
  Filled 2023-01-14 (×2): qty 1

## 2023-01-14 MED ORDER — ACETAMINOPHEN 160 MG/5ML PO SOLN: 650.0000 mg | ORAL | Status: DC | PRN

## 2023-01-14 MED ORDER — LORAZEPAM 2 MG/ML IJ SOLN: 0.0000 mg | Freq: Four times a day (QID) | INTRAMUSCULAR | Status: DC

## 2023-01-14 MED ORDER — ACETAMINOPHEN 650 MG RE SUPP: 650.0000 mg | RECTAL | Status: DC | PRN

## 2023-01-14 MED ORDER — STROKE: EARLY STAGES OF RECOVERY BOOK: Freq: Once | Status: AC

## 2023-01-14 MED ORDER — SODIUM CHLORIDE 0.9 % IV SOLN: INTRAVENOUS | Status: DC

## 2023-01-14 MED ORDER — SENNOSIDES-DOCUSATE SODIUM 8.6-50 MG PO TABS: 1.0000 | ORAL_TABLET | Freq: Every evening | ORAL | Status: DC | PRN

## 2023-01-14 MED ORDER — ACETAMINOPHEN 325 MG PO TABS: 650.0000 mg | ORAL_TABLET | ORAL | Status: DC | PRN

## 2023-01-14 MED ORDER — THIAMINE MONONITRATE 100 MG PO TABS
100.0000 mg | ORAL_TABLET | Freq: Every day | ORAL | Status: DC
  Administered 2023-01-14 – 2023-01-15 (×2): 100 mg via ORAL
  Filled 2023-01-14 (×2): qty 1

## 2023-01-14 MED ORDER — ONDANSETRON HCL 4 MG/2ML IJ SOLN: 4.0000 mg | Freq: Three times a day (TID) | INTRAMUSCULAR | Status: DC | PRN

## 2023-01-14 MED ORDER — LORAZEPAM 2 MG/ML IJ SOLN: 1.0000 mg | INTRAMUSCULAR | Status: DC | PRN

## 2023-01-14 MED ORDER — FOLIC ACID 1 MG PO TABS
1.0000 mg | ORAL_TABLET | Freq: Every day | ORAL | Status: DC
  Administered 2023-01-14 – 2023-01-15 (×2): 1 mg via ORAL
  Filled 2023-01-14 (×2): qty 1

## 2023-01-14 MED ORDER — ASPIRIN 325 MG PO TBEC
325.0000 mg | DELAYED_RELEASE_TABLET | Freq: Once | ORAL | Status: AC
  Administered 2023-01-14: 325 mg via ORAL
  Filled 2023-01-14: qty 1

## 2023-01-14 MED ORDER — IOHEXOL 350 MG/ML SOLN
75.0000 mL | Freq: Once | INTRAVENOUS | Status: AC | PRN
  Administered 2023-01-14: 75 mL via INTRAVENOUS

## 2023-01-14 MED ORDER — ENOXAPARIN SODIUM 40 MG/0.4ML IJ SOSY
40.0000 mg | PREFILLED_SYRINGE | INTRAMUSCULAR | Status: DC
  Administered 2023-01-14: 40 mg via SUBCUTANEOUS
  Filled 2023-01-14: qty 0.4

## 2023-01-14 MED ORDER — ASPIRIN 300 MG RE SUPP: 300.0000 mg | Freq: Every day | RECTAL | Status: DC

## 2023-01-14 MED ORDER — LORAZEPAM 2 MG/ML IJ SOLN: 0.0000 mg | Freq: Two times a day (BID) | INTRAMUSCULAR | Status: DC

## 2023-01-14 MED ORDER — DM-GUAIFENESIN ER 30-600 MG PO TB12: 1.0000 | ORAL_TABLET | Freq: Two times a day (BID) | ORAL | Status: DC | PRN

## 2023-01-14 NOTE — ED Triage Notes (Signed)
Pt sts that he came home from work early not feeling well. Pt sts that he thinks that he is dehydrated.

## 2023-01-14 NOTE — H&P (Signed)
History and Physical    Dean Clements BMW:[id number] DOB: 02-Jan-1966 DOA: 01/14/2023  Referring MD/NP/PA:   PCP: Larae Grooms, NP   Patient coming from:  The patient is coming from home.     Chief Complaint: AMS  HPI: Dean Clements is a 56 y.o. male with medical history significant of HTN, H LD, DM, COPD, former smoker, gout, depression with anxiety, chronic low back pain, syringomyelia and syringobulbia, polyneuropathy, alcohol use, who presents with altered mental status.  Per his daughter at bedside, pt was noted to be confused at work at about 12:00 today. He has mild slurred speech, dizziness and lightheadedness. Pt  reports feeling of "loopy". No unilateral numbness or tingliness in extremities.  No facial droop.  No fall or injury. When I saw pt, he is confused, he is orientated to person and place, but confused about time. His slurred speech has resolved.  He moves all extremities normally.  Denies chest pain, cough, shortness of breath.  No nausea, vomiting, diarrhea or abdominal pain he denies symptoms of UTI.  Of note, patient was recently started on baclofen 3 days ago due to chronic back pain in UC and also started on prednisone taper. He took baclofen at about 5:30 AM and the went to work.  Data reviewed independently and ED Course: pt was found to have WBC 11.8, GFR> 60, alcohol level less than 10, temperature normal, blood pressure 149/96, heart rate 67, RR 18, oxygen saturation 100% on room air, heart rate 67, 18, oxygen saturation 100% on room air.  CT of head negative.  MRI-brain showed no acute intracranial abnormality, and with chronic  microangiopathic white matter changes .   CTA-head and neck: 1. No emergent large vessel occlusion or hemodynamically significant stenosis of the head or neck. 2. Sclerotic focus within the T3 vertebral body, likely a bone island.  EKG: I have personally reviewed.  Not done in ED, will get one.    ***   Review of Systems:   General: no fevers, chills, no body weight gain, fatigue HEENT: no blurry vision, hearing changes or sore throat Respiratory: no dyspnea, coughing, wheezing CV: no chest pain, no palpitations GI: no nausea, vomiting, abdominal pain, diarrhea, constipation GU: no dysuria, burning on urination, increased urinary frequency, hematuria  Ext: no leg edema Neuro: no unilateral weakness, numbness, or tingling, no vision change or hearing loss. Has confusion, dizziness, slurred speech Skin: no rash, no skin tear. MSK: No muscle spasm, no deformity, no limitation of range of movement in spin.  Has chronic low back pain Heme: No easy bruising.  Travel history: No recent long distant travel.   Allergy: No Known Allergies  Past Medical History:  Diagnosis Date   Depression    Diabetes mellitus without complication (HCC)    type 2   GERD (gastroesophageal reflux disease)    Hyperlipidemia    Hypertension    Low back pain 2004   occasional back flares each year.     Past Surgical History:  Procedure Laterality Date   LUMBAR LAMINECTOMY/DECOMPRESSION MICRODISCECTOMY Bilateral 07/27/2020   Procedure: Laminectomy Lumbar four-Lumbar five - bilateral, Microdiscectomy - Lumbar five-Sacral one - right;  Surgeon: Donalee Citrin, MD;  Location: [facility] OR;  Service: Neurosurgery;  Laterality: Bilateral;   none     TONSILLECTOMY     as a child   WISDOM TOOTH EXTRACTION     as a teenager    Social History:  reports that he has quit smoking. His smoking use  included cigarettes. He has a 0.6 pack-year smoking history. He uses smokeless tobacco. He reports current alcohol use of about 12.0 standard drinks of alcohol per week. He reports that he does not currently use drugs after having used the following drugs: Marijuana.  Family History:  Family History  Problem Relation Age of Onset   Cancer Mother    Heart disease Father    Diabetes Father    Hypertension Father       Prior to Admission medications   Medication Sig Start Date End Date Taking? Authorizing Provider  Accu-Chek Softclix Lancets lancets SMARTSIG:Topical 11/22/20   [provider]  albuterol (VENTOLIN HFA) 108 (90 Base) MCG/ACT inhaler Inhale 2 puffs into the lungs every 6 (six) hours as needed for wheezing or shortness of breath. 11/06/21   Vigg, Avanti, MD  baclofen (LIORESAL) 10 MG tablet Take 1 tablet (10 mg total) by mouth 3 (three) times daily. 01/12/23   Becky Augusta, NP  fexofenadine [facility] ALLERGY) 180 MG tablet Take 1 tablet (180 mg total) by mouth daily. Patient not taking: Reported on 02/04/2022 10/16/21   Vigg, Roma Schanz, MD  fluticasone furoate-vilanterol (BREO ELLIPTA) 100-25 MCG/ACT AEPB Inhale 1 puff into the lungs daily. 11/06/21   Vigg, Avanti, MD  gabapentin (NEURONTIN) 300 MG capsule TAKE 1 CAPSULE BY MOUTH TWICE DAILY 12/22/22   Larae Grooms, NP  losartan (COZAAR) 25 MG tablet Take 1 tablet (25 mg total) by mouth daily. 07/04/21   Johnson, Megan P, DO  predniSONE (STERAPRED UNI-PAK 21 TAB) 10 MG (21) TBPK tablet Take 6 tablets on day 1, 5 tablets day 2, 4 tablets day 3, 3 tablets day 4, 2 tablets day 5, 1 tablet day 6 01/12/23   Becky Augusta, NP  Tiotropium Bromide Monohydrate (SPIRIVA RESPIMAT) 2.5 MCG/ACT AERS Inhale 2 puffs into the lungs daily. 10/16/21   Vigg, Avanti, MD  traZODone (DESYREL) 50 MG tablet TAKE 1/2-1 TABLET BY MOUTH AT BEDTIME ASNEEDED FOR SLEEP 10/09/21   Loura Pardon, MD    Physical Exam: Vitals:   01/14/23 1525 01/14/23 1526 01/14/23 1923 01/14/23 1945  BP: (!) 149/96  (!) 167/98 (!) 151/86  Pulse: 67  74 60  Resp: 18  17 20   Temp: 98.1 F (36.7 C)  98 F (36.7 C) 98.1 F (36.7 C)  TempSrc: Oral  Oral Oral  SpO2: 100%  99% 99%  Weight:  95.7 kg    Height:  6' (1.829 m)     General: Not in acute distress.  Dry mucous membrane HEENT:       Eyes: PERRL, EOMI, no jaundice       ENT: No discharge from the ears and nose, no pharynx injection, no  tonsillar enlargement.        Neck: No JVD, no bruit, no mass felt. Heme: No neck lymph node enlargement. Cardiac: S1/S2, RRR, No murmurs, No gallops or rubs. Respiratory: No rales, wheezing, rhonchi or rubs. GI: Soft, nondistended, nontender, no organomegaly, BS present. GU: No hematuria Ext: No pitting leg edema bilaterally. 1+DP/PT pulse bilaterally. Musculoskeletal: No joint deformities, No joint redness or warmth, no limitation of ROM in spin. Skin: No rashes.  Neuro: Confused, following command, orientated to place and person, but not orientated to time, cranial nerves II-XII grossly intact, moves all extremities normally. Muscle strength 5/5 in all extremities, sensation to light touch intact.  Psych: Patient is not psychotic, no suicidal or hemocidal ideation.  Labs on Admission: I have personally reviewed following labs and imaging studies  CBC: Recent Labs  Lab 01/14/23 1610  WBC 11.8*  NEUTROABS 9.4*  HGB 15.0  HCT 43.7  MCV 90.3  PLT 257   Basic Metabolic Panel: Recent Labs  Lab 01/14/23 1610  NA 139  K 4.0  CL 103  CO2 28  GLUCOSE 291*  BUN 18  CREATININE 0.93  CALCIUM 9.2   GFR: Estimated Creatinine Clearance: 105.1 mL/min (by C-G formula based on SCr of 0.93 mg/dL). Liver Function Tests: Recent Labs  Lab 01/14/23 1610  AST 34  ALT 30  ALKPHOS 72  BILITOT 0.8  PROT 7.6  ALBUMIN 4.4   No results for input(s): "LIPASE", "AMYLASE" in the last 168 hours. No results for input(s): "AMMONIA" in the last 168 hours. Coagulation Profile: No results for input(s): "INR", "PROTIME" in the last 168 hours. Cardiac Enzymes: No results for input(s): "CKTOTAL", "CKMB", "CKMBINDEX", "TROPONINI" in the last 168 hours. BNP (last 3 results) No results for input(s): "PROBNP" in the last 8760 hours. HbA1C: No results for input(s): "HGBA1C" in the last 72 hours. CBG: No results for input(s): "GLUCAP" in the last 168 hours. Lipid Profile: No results for input(s):  "CHOL", "HDL", "LDLCALC", "TRIG", "CHOLHDL", "LDLDIRECT" in the last 72 hours. Thyroid Function Tests: No results for input(s): "TSH", "T4TOTAL", "FREET4", "T3FREE", "THYROIDAB" in the last 72 hours. Anemia Panel: No results for input(s): "VITAMINB12", "FOLATE", "FERRITIN", "TIBC", "IRON", "RETICCTPCT" in the last 72 hours. Urine analysis:    Component Value Date/Time   COLORURINE YELLOW (A) 03/04/2018 1744   APPEARANCEUR Clear 07/04/2021 1105   LABSPEC 1.015 03/04/2018 1744   PHURINE 5.0 03/04/2018 1744   GLUCOSEU Negative 07/04/2021 1105   HGBUR NEGATIVE 03/04/2018 1744   BILIRUBINUR Negative 07/04/2021 1105   KETONESUR NEGATIVE 03/04/2018 1744   PROTEINUR Negative 07/04/2021 1105   PROTEINUR 30 (A) 03/04/2018 1744   UROBILINOGEN 0.2 03/04/2018 1625   NITRITE Negative 07/04/2021 1105   NITRITE NEGATIVE 03/04/2018 1744   LEUKOCYTESUR Negative 07/04/2021 1105   Sepsis Labs: @LABRCNTIP (procalcitonin:4,lacticidven:4) )No results found for this or any previous visit (from the past 240 hour(s)).   Radiological Exams on Admission: CT Angio Head Neck W WO CM  Result Date: 01/14/2023 CLINICAL DATA:  Acute neurologic deficit EXAM: CT ANGIOGRAPHY HEAD AND NECK WITH AND WITHOUT CONTRAST TECHNIQUE: Multidetector CT imaging of the head and neck was performed using the standard protocol during bolus administration of intravenous contrast. Multiplanar CT image reconstructions and MIPs were obtained to evaluate the vascular anatomy. Carotid stenosis measurements (when applicable) are obtained utilizing NASCET criteria, using the distal internal carotid diameter as the denominator. RADIATION DOSE REDUCTION: This exam was performed according to the departmental dose-optimization program which includes automated exposure control, adjustment of the mA and/or kV according to patient size and/or use of iterative reconstruction technique. CONTRAST:  75mL OMNIPAQUE IOHEXOL 350 MG/ML SOLN COMPARISON:  None  Available. FINDINGS: CTA NECK FINDINGS SKELETON: Sclerotic focus within the T3 vertebral body. OTHER NECK: Normal pharynx, larynx and major salivary glands. No cervical lymphadenopathy. Unremarkable thyroid gland. UPPER CHEST: No pneumothorax or pleural effusion. No nodules or masses. AORTIC ARCH: There is no calcific atherosclerosis of the aortic arch. Conventional 3 vessel aortic branching pattern. RIGHT CAROTID SYSTEM: No dissection, occlusion or aneurysm. Mild atherosclerotic calcification at the carotid bifurcation without hemodynamically significant stenosis. LEFT CAROTID SYSTEM: No dissection, occlusion or aneurysm. Mild atherosclerotic calcification at the carotid bifurcation without hemodynamically significant stenosis. VERTEBRAL ARTERIES: Right dominant configuration.There is no dissection, occlusion or flow-limiting stenosis to the skull base (V1-V3 segments).  CTA HEAD FINDINGS POSTERIOR CIRCULATION: --Vertebral arteries: Normal V4 segments. --Inferior cerebellar arteries: Normal. --Basilar artery: Normal. --Superior cerebellar arteries: Normal. --Posterior cerebral arteries (PCA): Normal. ANTERIOR CIRCULATION: --Intracranial internal carotid arteries: Normal. --Anterior cerebral arteries (ACA): Normal. Both A1 segments are present. Patent anterior communicating artery (a-comm). --Middle cerebral arteries (MCA): Normal. VENOUS SINUSES: As permitted by contrast timing, patent. ANATOMIC VARIANTS: None Review of the MIP images confirms the above findings. IMPRESSION: 1. No emergent large vessel occlusion or hemodynamically significant stenosis of the head or neck. 2. Sclerotic focus within the T3 vertebral body, likely a bone island. Electronically Signed   By: Deatra Robinson M.D.   On: 01/14/2023 19:51   MR BRAIN WO CONTRAST  Result Date: 01/14/2023 CLINICAL DATA:  Transient ischemic attack EXAM: MRI HEAD WITHOUT CONTRAST TECHNIQUE: Multiplanar, multiecho pulse sequences of the brain and surrounding  structures were obtained without intravenous contrast. COMPARISON:  None Available. FINDINGS: Brain: No acute infarct, mass effect or extra-axial collection. No acute or chronic hemorrhage. There is multifocal hyperintense T2-weighted signal within the white matter. Parenchymal volume and CSF spaces are normal. The midline structures are normal. Vascular: Major flow voids are preserved. Skull and upper cervical spine: Normal calvarium and skull base. Visualized upper cervical spine and soft tissues are normal. Sinuses/Orbits:Mild paranasal sinus mucosal thickening. Normal orbits. IMPRESSION: 1. No acute intracranial abnormality. 2. Chronic microangiopathic white matter changes. Electronically Signed   By: Deatra Robinson M.D.   On: 01/14/2023 19:01   CT HEAD WO CONTRAST ( )  Result Date: 01/14/2023 CLINICAL DATA:  Mental status change, unknown cause EXAM: CT HEAD WITHOUT CONTRAST TECHNIQUE: Contiguous axial images were obtained from the base of the skull through the vertex without intravenous contrast. RADIATION DOSE REDUCTION: This exam was performed according to the departmental dose-optimization program which includes automated exposure control, adjustment of the mA and/or kV according to patient size and/or use of iterative reconstruction technique. COMPARISON:  None Available. FINDINGS: Brain: No evidence of acute infarction, hemorrhage, hydrocephalus, extra-axial collection or mass lesion/mass effect. Vascular: No hyperdense vessel or unexpected calcification. Skull: Negative for fracture or suspicious lesion. Incidentally noted sclerotic enostosis within the clivus. Sinuses/Orbits: Diffuse paranasal sinus mucosal thickening. Small amount of layering debris within the left sphenoid sinus. Mastoid air cells are clear. Other: None. IMPRESSION: 1. No acute intracranial findings. 2. Diffuse paranasal sinus disease. Electronically Signed   By: Duanne Guess D.O.   On: 01/14/2023 16:22       Assessment/Plan Principal Problem:   Acute metabolic encephalopathy Active Problems:   Essential hypertension   Type 2 diabetes mellitus with diabetic neuropathy (HCC)   Hyperlipidemia associated with type 2 diabetes mellitus (HCC)   Leukocytosis   Chronic bilateral back pain   Alcohol use   Depression with anxiety   Assessment and Plan:  Acute metabolic encephalopathy: Etiology is not clear.  CT of head negative.  MRI of brain negative for acute intracranial abnormalities. Patient is newly started on baclofen, which may have contributed partially.  Other differential diagnosis to include drug abuse, alcohol use, TIA.  -place in tele bed for obs -hold off baclofen -Check UDS -give 325 mg of aspirin empirically -Fall precaution -Frequent neurochecks -Follow-up urinalysis -Follow-up CT angio of head and neck which is ordered by ED physician --> negative for LVO  Essential hypertension -Cozaar -IV hydralazine as needed  Diet controlled type 2 diabetes mellitus with diabetic neuropathy (HCC): Recent A1c 6.6.  Patient is not taking medications currently. Blood sugar 291 by BMP. -SSI  Hyperlipidemia associated with type 2 diabetes mellitus [facility]): Patient is not taking medications currently -f/u FLP  Leukocytosis: WBC 11.8, no fever, no source of infection identified, likely due to prednisone use. -Follow UA  Chronic bilateral back pain: -Hold off baclofen -Continue Neurontin -As needed Tylenol  Alcohol use: Patient drinks approximately 12 drinks /week, currently no signs of withdrawal. -CIWA protocol -Data counseling about importance of cutting down drinking of alcohol  Depression with anxiety: Patient is only taking trazodone at bedtime for sleep -Observe closely       DVT ppx: SQ Lovenox  Code Status: Full code   Family Communication: Yes, patient's daughter  at bed side.      Disposition Plan:  Anticipate discharge back to previous  environment  Consults called:  none  Admission status and Level of care: Telemetry Medical:  for obs   Dispo: The patient is from: Home              Anticipated d/c is to: Home              Anticipated d/c date is: 1 day              Patient currently is not medically stable to d/c.    Severity of Illness:  The appropriate patient status for this patient is OBSERVATION. Observation status is judged to be reasonable and necessary in order to provide the required intensity of service to ensure the patient's safety. The patient's presenting symptoms, physical exam findings, and initial radiographic and laboratory data in the context of their medical condition is felt to place them at decreased risk for further clinical deterioration. Furthermore, it is anticipated that the patient will be medically stable for discharge from the hospital within 2 midnights of admission.        Date of Service 01/14/2023    Lorretta Harp [facility]   If 7PM-7AM, please contact night-coverage [email or web address] 01/14/2023, 8:01 PM

## 2023-01-14 NOTE — ED Provider Notes (Signed)
[facility] Provider Note    Event Date/Time   First MD Initiated Contact with Patient 01/14/23 1528     (approximate)   History   Dehydration   HPI  Milton Lanier is a 57 y.o. male with history of diabetes, hyperlipidemia, chronic back pain and COPD presents emergency department with concerns of altered mental status.  Patient was recently placed on baclofen.  Took his medication around 530 this morning.  States that work approximately right before lunch she started feeling "loopy ".  Daughter states she had some slurred speech when trying to talk.  Patient also states she just does not feel right.  No weakness in the arms and legs.  No chest pain or shortness of breath.  States had same symptoms previously when he got dehydrated.  Also concerned it could be a medication reaction      Physical Exam   Triage Vital Signs: ED Triage Vitals  Encounter Vitals Group     BP 01/14/23 1525 (!) 149/96     Systolic BP Percentile --      Diastolic BP Percentile --      Pulse Rate 01/14/23 1525 67     Resp 01/14/23 1525 18     Temp 01/14/23 1525 98.1 F (36.7 C)     Temp Source 01/14/23 1525 Oral     SpO2 01/14/23 1525 100 %     Weight 01/14/23 1526 211 lb (95.7 kg)     Height 01/14/23 1526 6' (1.829 m)     Head Circumference --      Peak Flow --      Pain Score 01/14/23 1525 0     Pain Loc --      Pain Education --      Exclude from Growth Chart --     Most recent vital signs: Vitals:   01/14/23 1525  BP: (!) 149/96  Pulse: 67  Resp: 18  Temp: 98.1 F (36.7 C)  SpO2: 100%     General: Awake, no distress.   CV:  Good peripheral perfusion. regular rate and  rhythm Resp:  Normal effort. Lungs cta Abd:  No distention.   Other:  PERRL, EOMI, cranial nerves II through XII grossly intact, grips equal bilaterally   ED Results / Procedures / Treatments   Labs (all labs ordered are listed, but only abnormal results are displayed) Labs  Reviewed  COMPREHENSIVE METABOLIC PANEL - Abnormal; Notable for the following components:      Result Value   Glucose, Bld 291 (*)    All other components within normal limits  CBC WITH DIFFERENTIAL/PLATELET - Abnormal; Notable for the following components:   WBC 11.8 (*)    Neutro Abs 9.4 (*)    All other components within normal limits  ETHANOL  URINALYSIS, ROUTINE W REFLEX MICROSCOPIC  URINE DRUG SCREEN, QUALITATIVE ([facility] ONLY)  HIV ANTIBODY (ROUTINE TESTING W REFLEX)  LIPID PANEL  HEMOGLOBIN A1C     EKG     RADIOLOGY CT of the head    PROCEDURES:   Procedures   MEDICATIONS ORDERED IN ED: Medications   stroke: early stages of recovery book (has no administration in time range)  0.9 %  sodium chloride infusion (has no administration in time range)  acetaminophen (TYLENOL) tablet 650 mg (has no administration in time range)    Or  acetaminophen (TYLENOL) 160 MG/5ML solution 650 mg (has no administration in time range)    Or  acetaminophen (TYLENOL)  suppository 650 mg (has no administration in time range)  senna-docusate (Senokot-S) tablet 1 tablet (has no administration in time range)  enoxaparin (LOVENOX) injection 40 mg (has no administration in time range)  aspirin suppository 300 mg (has no administration in time range)    Or  aspirin tablet 325 mg (has no administration in time range)  hydrALAZINE (APRESOLINE) injection 5 mg (has no administration in time range)  ondansetron (ZOFRAN) injection 4 mg (has no administration in time range)  albuterol (PROVENTIL) (2.5 MG/3ML) 0.083% nebulizer solution 2.5 mg (has no administration in time range)  dextromethorphan-guaiFENesin (MUCINEX DM) 30-600 MG per 12 hr tablet 1 tablet (has no administration in time range)  LORazepam (ATIVAN) tablet 1-4 mg (has no administration in time range)    Or  LORazepam (ATIVAN) injection 1-4 mg (has no administration in time range)  thiamine (VITAMIN B1) tablet 100 mg (has no  administration in time range)    Or  thiamine (VITAMIN B1) injection 100 mg (has no administration in time range)  folic acid (FOLVITE) tablet 1 mg (has no administration in time range)  multivitamin with minerals tablet 1 tablet (has no administration in time range)  LORazepam (ATIVAN) injection 0-4 mg (has no administration in time range)    Followed by  LORazepam (ATIVAN) injection 0-4 mg (has no administration in time range)  sodium chloride 0.9 % bolus 1,000 mL (1,000 mLs Intravenous New Bag/Given 01/14/23 1617)  sodium chloride 0.9 % bolus 1,000 mL (1,000 mLs Intravenous New Bag/Given 01/14/23 1749)  iohexol (OMNIPAQUE) 350 MG/ML injection 75 mL (75 mLs Intravenous Contrast Given 01/14/23 1850)     IMPRESSION / MDM / ASSESSMENT AND PLAN / ED COURSE  I reviewed the triage vital signs and the nursing notes.                              Differential diagnosis includes, but is not limited to, CVA, SAH, TIA, medication reaction, hyper/hypoglycemia, dehydration  Patient's presentation is most consistent with acute presentation with potential threat to life or bodily function.   Due to concerns of slurred speech we will order CT of the head, I did not call code stroke as symptoms have resolved and the patient's timeline is greater than 4 hours.  Labs ordered, patient was given normal saline 1 L IV   CT of the head independently reviewed and interpreted by me, did reviewed radiologist reading, no acute abnormality noted.  Went in to explain the CT findings to the patient.  He appears to be a little drowsy, had difficulty finding his words and when speaking got the words mixed up.  Will order MRI of the brain, Dr. Arnoldo Morale in to see the patient  Additional labs ordered, MRI of the brain, CT angio of the head and neck with and without contrast ordered  In discussion with Dr. Arnoldo Morale we both believe that he needs to be admitted for altered mental status workup.  She noted neuro changes where he  could not differentiate between right and left on her exam.  States his gait was also off  Spoke with his daughter.  She states that his employer actually drove him home earlier today from [place] and someone else drove his truck home.  Concerned at work that he was altered  Consult hospitalist for admission  Discussed with Dr.Niu, he will be admitting the patient.  He was informed that last known well was at 5:30 AM this  morning.  Patient is stable at time of admission, CT and MRI is pending along with other lab work.   FINAL CLINICAL IMPRESSION(S) / ED DIAGNOSES   Final diagnoses:  Altered mental status, unspecified altered mental status type     Rx / DC Orders   ED Discharge Orders     None        Note:  This document was prepared using Dragon voice recognition software and may include unintentional dictation errors.    Faythe Ghee, PA-C 01/14/23 Helyn App, MD 01/14/23 [phone]

## 2023-01-15 DIAGNOSIS — G9341 Metabolic encephalopathy: Secondary | ICD-10-CM | POA: Diagnosis not present

## 2023-01-15 LAB — BLOOD GAS, VENOUS
Acid-Base Excess: 5.5 mmol/L — ABNORMAL HIGH (ref 0.0–2.0)
Bicarbonate: 28.9 mmol/L — ABNORMAL HIGH (ref 20.0–28.0)
O2 Saturation: 97.8 %
Patient temperature: 37
pCO2, Ven: 37 mmHg — ABNORMAL LOW (ref 44–60)
pH, Ven: 7.5 — ABNORMAL HIGH (ref 7.25–7.43)
pO2, Ven: 81 mmHg — ABNORMAL HIGH (ref 32–45)

## 2023-01-15 LAB — GLUCOSE, CAPILLARY
Glucose-Capillary: 191 mg/dL — ABNORMAL HIGH (ref 70–99)
Glucose-Capillary: 196 mg/dL — ABNORMAL HIGH (ref 70–99)

## 2023-01-15 LAB — AMMONIA: Ammonia: 15 umol/L (ref 9–35)

## 2023-01-15 MED ORDER — TRAZODONE HCL 50 MG PO TABS: 50.0000 mg | ORAL_TABLET | Freq: Every evening | ORAL | Status: DC | PRN

## 2023-01-15 MED ORDER — LOSARTAN POTASSIUM 25 MG PO TABS
25.0000 mg | ORAL_TABLET | Freq: Every day | ORAL | 1 refills | Status: AC
Start: 2023-01-15 — End: ?

## 2023-01-15 MED ORDER — LOSARTAN POTASSIUM 25 MG PO TABS
25.0000 mg | ORAL_TABLET | Freq: Every day | ORAL | Status: DC
  Administered 2023-01-15: 25 mg via ORAL
  Filled 2023-01-15: qty 1

## 2023-01-15 MED ORDER — ATORVASTATIN CALCIUM 20 MG PO TABS: 20.0000 mg | ORAL_TABLET | Freq: Every day | ORAL | 1 refills | Status: AC

## 2023-01-15 MED ORDER — CYCLOBENZAPRINE HCL 10 MG PO TABS: 10.0000 mg | ORAL_TABLET | Freq: Three times a day (TID) | ORAL | 0 refills | Status: DC | PRN

## 2023-01-15 MED ORDER — GABAPENTIN 300 MG PO CAPS
300.0000 mg | ORAL_CAPSULE | Freq: Two times a day (BID) | ORAL | Status: DC
  Administered 2023-01-15 (×2): 300 mg via ORAL
  Filled 2023-01-15 (×2): qty 1

## 2023-01-15 MED ORDER — GABAPENTIN 300 MG PO CAPS: 600.0000 mg | ORAL_CAPSULE | Freq: Two times a day (BID) | ORAL | 0 refills | Status: AC

## 2023-01-15 NOTE — Plan of Care (Signed)

## 2023-01-15 NOTE — ED Provider Notes (Signed)
Shared visit:   Patient presents with his daughter for AMS.  LNW at 5:30 this morning.  Patient without new weakness, but is confused and answering questions inappropriately.  No falls or trauma.  Recent acute on chronic back pain visit to UC and was given baclofen, which is a new medication.  No fever or seizure history.  Drinks approx 2 beers a day.   Physical Exam Constitutional:      Appearance: He is well-developed. He is obese.  HENT:     Head: Atraumatic.  Eyes:     Conjunctiva/sclera: Conjunctivae normal.  Cardiovascular:     Rate and Rhythm: Regular rhythm.     Heart sounds: No murmur heard. Pulmonary:     Effort: No respiratory distress.  Musculoskeletal:        General: Normal range of motion.     Cervical back: Normal range of motion and neck supple. No tenderness.     Right lower leg: No edema.     Left lower leg: No edema.     Comments: No midline thoracic or lumbar ttp  Skin:    General: Skin is warm.  Neurological:     Mental Status: He is alert. Mental status is at baseline.     GCS: GCS eye subscore is 4. GCS verbal subscore is 4. GCS motor subscore is 6.     Cranial Nerves: Cranial nerves 2-12 are intact.     Motor: Motor function is intact.     Coordination: Coordination is intact.     Gait: Gait abnormal (walking with a limp to the left leg).     Comments: States right side when touching left UE and left LE, states right side when touching both sides for UE and LE.   Sits holding his arms up and is slow to put them down.   No rigidity       Outside of window for TNK. No deficit consistent with LVO.  CTA ordered and MRI neg.  Possible polypharmacy.  No fever, doubt NMS or seratonin syndrome.  No nystagmus, abnormal gait but back pain recently - doubt wernicke's.  Admit for AMS discussed with hospitalist.      Corena Herter, MD 01/15/23 [phone]

## 2023-01-15 NOTE — Progress Notes (Signed)
On admission, pt has a pocket knife with him, this RN returned his pocket knife to his daughter Luanne Bras.

## 2023-01-15 NOTE — Inpatient Diabetes Management (Signed)
Inpatient Diabetes Program Recommendations  AACE/ADA: New Consensus Statement on Inpatient Glycemic Control (2015)  Target Ranges:  Prepandial:   less than 140 mg/dL      Peak postprandial:   less than 180 mg/dL (1-2 hours)      Critically ill patients:  140 - 180 mg/dL    Latest Reference Range & Units 03/13/22 15:35 01/14/23 16:10  Hemoglobin A1C 4.8 - 5.6 % 6.6 (H) 8.0 (H)  (H): Data is abnormally high  Latest Reference Range & Units 01/14/23 22:57 01/15/23 08:08  Glucose-Capillary 70 - 99 mg/dL 962 (H)  2 units Novolog  191 (H)  (H): Data is abnormally high     Admit with: AMS  History: DM2, ETOH use  Home DM Meds: None--Diet Controlled  Current Orders: Novolog Sensitive Correction Scale/ SSI (0-9 units) TID AC + HS    MD- Note A1c rose from 6.6% to 8%.  Not on any diabetes meds at home.  May consider starting Metformin for home at time of discharge and have pt follow up with PCP    --Will follow patient during hospitalization--  Ambrose Finland RN, MSN, CDCES Diabetes Coordinator Inpatient Glycemic Control Team Team Pager: [phone] (8a-5p)

## 2023-01-15 NOTE — Evaluation (Addendum)
Physical Therapy Evaluation Patient Details Name: Dean Clements MRN: [id number] DOB: [birth year] Today's Date: 01/15/2023  History of Present Illness  Pt is a 57 y/o male presenting to hospital with transient mild slurred speech, dizziness and lightheadedness. Pt was admitted to hospital for Acute metabolic encephalopathy. PMH includes: HTN, T2DM, HLD, Leukocytosis, chronic bilateral back pain, tobacco use, and lumber laminectomy in 2022.  Clinical Impression  Pt was pleasant and motivated to participate during the session and put forth good effort throughout. Pt found supine in bed. Pt's SpO2 in mid to high 90's throughout session, HR within normative range throughout. Pt bed mobility and transfers are independent. Pt ambulated 300 feet total with stair navigation between bout. No cues necessary throughout, walking speed slightly antalgic due to heel pain but otherwise normative for pt. Stair navigation was completed safely with no cues provided. General UE and LE strength WFL. Pt presenting close to baseline, with no need of further PT services, all DME needs currently met. DC from in house PT at this time.         If plan is discharge home, recommend the following:     Can travel by private vehicle    Able to go by private vehicle    Equipment Recommendations None recommended by PT  Recommendations for Other Services       Functional Status Assessment Patient has not had a recent decline in their functional status     Precautions / Restrictions Precautions Precautions: Fall Restrictions Weight Bearing Restrictions: No      Mobility  Bed Mobility Overal bed mobility: Independent                  Transfers Overall transfer level: Independent Equipment used: None                    Ambulation/Gait Ambulation/Gait assistance: Independent Gait Distance (Feet): 300 Feet Assistive device: None Gait Pattern/deviations: Step-through pattern, WFL(Within  Functional Limits), Narrow base of support Gait velocity: normal     General Gait Details: Slightly antalgic gait pattern due to mild heel pain. Normal speed, able to make quick turns  Stairs Stairs: Yes Stairs assistance: Modified independent (Device/Increase time) Stair Management: Two rails, Alternating pattern Number of Stairs: 4 General stair comments: Pt performs quickly and safely, utilizing same setup as home.  Wheelchair Mobility     Tilt Bed    Modified Rankin (Stroke Patients Only)       Balance Overall balance assessment: No apparent balance deficits (not formally assessed)                                           Pertinent Vitals/Pain Pain Assessment Pain Assessment: Faces Faces Pain Scale: Hurts a little bit Pain Location: Pt reporting his heel feels bruised. Pt noted chronic back pain Pain Descriptors / Indicators: Sore Pain Intervention(s): Monitored during session    Home Living Family/patient expects to be discharged to:: Private residence Living Arrangements: Children Available Help at Discharge: Family;Available 24 hours/day Type of Home: Mobile home Home Access: Stairs to enter Entrance Stairs-Rails: Right;Left;Can reach both Entrance Stairs-Number of Steps: 3   Home Layout: One level Home Equipment: Grab bars - tub/shower;Adaptive equipment      Prior Function Prior Level of Function : Independent/Modified Independent;Driving;Working/employed             Mobility Comments: Working full-time,  Community ambulator ADLs Comments: Able to complete independently     Extremity/Trunk Assessment   Upper Extremity Assessment Upper Extremity Assessment: Overall WFL for tasks assessed    Lower Extremity Assessment Lower Extremity Assessment: Overall WFL for tasks assessed       Communication   Communication Communication: No apparent difficulties  Cognition Arousal: Alert Behavior During Therapy: WFL for tasks  assessed/performed Overall Cognitive Status: Within Functional Limits for tasks assessed                                          General Comments      Exercises Other Exercises Other Exercises: General MMT in UE and LE, equal and WFL bilaterally.   Assessment/Plan    PT Assessment Patient does not need any further PT services  PT Problem List         PT Treatment Interventions      PT Goals (Current goals can be found in the Care Plan section)  Acute Rehab PT Goals PT Goal Formulation: All assessment and education complete, DC therapy    Frequency       Co-evaluation               AM-PAC PT "6 Clicks" Mobility  Outcome Measure Help needed turning from your back to your side while in a flat bed without using bedrails?: None Help needed moving from lying on your back to sitting on the side of a flat bed without using bedrails?: None Help needed moving to and from a bed to a chair (including a wheelchair)?: None Help needed standing up from a chair using your arms (e.g., wheelchair or bedside chair)?: None Help needed to walk in hospital room?: None Help needed climbing 3-5 steps with a railing? : None 6 Click Score: 24    End of Session Equipment Utilized During Treatment: Gait belt Activity Tolerance: Patient tolerated treatment well Patient left: in bed;Other (comment) (In care of OT) Nurse Communication: Mobility status PT Visit Diagnosis: Other abnormalities of gait and mobility (R26.89)    Time: 2952-8413 PT Time Calculation (min) (ACUTE ONLY): 19 min   Charges:                 Cecile Sheerer, SPT 01/15/23, 9:39 AM This entire session was performed under direct supervision and direction of a licensed therapist/therapist assistant. I have personally read, edited and approve of the note as written.  Loran Senters, DPT

## 2023-01-15 NOTE — TOC Transition Note (Signed)
Transition of Care [facility]) - CM/SW Discharge Note   Patient Details  Name: Dean Clements MRN: [id number] Date of Birth: [birth year]  [facility] CM/SW Contact:  Allena Katz, LCSW Phone Number: 01/15/2023, 12:06 PM   Clinical Narrative:   Pt actively being discharged before [facility] could see for resources. Substance use resources added to AVS.           Patient Goals and CMS Choice      Discharge Placement                         Discharge Plan and Services Additional resources added to the After Visit Summary for                                       Social Determinants of Health (SDOH) Interventions SDOH Screenings   Food Insecurity: No Food Insecurity (01/14/2023)  Housing: Low Risk  (01/14/2023)  Transportation Needs: No Transportation Needs (01/14/2023)  Utilities: Not At Risk (01/14/2023)  Alcohol Screen: Low Risk  (07/31/2021)  Depression (PHQ2-9): Low Risk  (02/04/2022)  Social Connections: Socially Isolated (09/06/2021)  Tobacco Use: High Risk (01/14/2023)     Readmission Risk Interventions     No data to display

## 2023-01-15 NOTE — Discharge Summary (Signed)
Physician Discharge Summary   Patient: Dean Clements MRN: [id number] DOB: [birth year]  Admit date:     01/14/2023  Discharge date: 01/15/23  Discharge Physician: Pennie Banter   PCP: Larae Grooms, NP   Recommendations at discharge:   Follow up with Primary Care Follow up on blood pressure, cholesterol, and glycemic control Repeat Hbg A1c in 3-6 months Follow up on chronic back pain and medication regimen. Avoid baclofen due to severe side effects.  Discharge Diagnoses: Principal Problem:   Acute metabolic encephalopathy Active Problems:   Essential hypertension   Type 2 diabetes mellitus with diabetic neuropathy (HCC)   Hyperlipidemia associated with type 2 diabetes mellitus (HCC)   Leukocytosis   Chronic bilateral back pain   Alcohol use   Depression with anxiety  Resolved Problems:   * No resolved hospital problems. [facility] Course:  HPI on admission 01/14/23 by Dr. Clyde Lundborg: "Dean Clements is a 57 y.o. male with medical history significant of HTN, H LD, DM, COPD, former smoker, gout, depression with anxiety, chronic low back pain, syringomyelia and syringobulbia, polyneuropathy, alcohol use, who presents with altered mental status.   Per his daughter at bedside, pt was noted to be confused at work at about 12:00 today. He has mild slurred speech, dizziness and lightheadedness. Pt  reports feeling of "loopy". No unilateral numbness or tingliness in extremities.  No facial droop.  No fall or injury. When I saw pt, he is confused, he is orientated to person and place, but confused about time. His slurred speech has resolved.  He moves all extremities normally.  Denies chest pain, cough, shortness of breath.  No nausea, vomiting, diarrhea or abdominal pain he denies symptoms of UTI.   Of note, patient was recently started on baclofen 3 days ago due to chronic back pain in UC and also started on prednisone taper. He took baclofen at about 5:30 AM and the  went to work.   Data reviewed independently and ED Course: pt was found to have WBC 11.8, GFR> 60, alcohol level less than 10, temperature normal, blood pressure 149/96, heart rate 67, RR 18, oxygen saturation 100% on room air, heart rate 67, 18, oxygen saturation 100% on room air.  CT of head negative.  MRI-brain showed no acute intracranial abnormality, and with chronic  microangiopathic white matter changes .    CTA-head and neck: 1. No emergent large vessel occlusion or hemodynamically significant stenosis of the head or neck. 2. Sclerotic focus within the T3 vertebral body, likely a bone island."   Further hospital course and management as outlined below.   01/15/23 -- pt's mental status at baseline.  He denies any acute complaints.  Agrees with staying off baclofen given onset of symptoms within first few days of starting this new medication.  He previously was on Flexeril which he tolerated well, but had run out, was seen at [facility] and prescribed baclofen instead.     Assessment and Plan:  Acute metabolic encephalopathy: Etiology is not clear, highly suspect due to baclofen which was recently new medication, pt had only taken it 3 days.  CT of head negative.  MRI of brain negative for acute intracranial abnormalities.  CT angio head and neck negative for LVO. Ammonia level normal and VBG negative for hypercapnia Other differential diagnosis includes drug abuse, alcohol use, TIA. --no event seen on telemetry --hold baclofen --UDS only positive for cannabis --UA no infection --Patient's mental status returned to baseline with holding of Baclofen  Essential hypertension -Cozaar   Uncontrolled type 2 diabetes mellitus with diabetic neuropathy [facility]): Recent A1c 6.6.   Patient is not taking medications currently.  -Covered with sliding scale Novolog during admission -Close PCP follow up as A1c is now up to 8.0% -Pt reports he had stopped metformin   Hyperlipidemia associated with  type 2 diabetes mellitus [facility]): Patient is not taking medications currently -f/u [facility]   Leukocytosis: WBC 11.8, no fever, no source of infection identified, likely due to prednisone use. -UA negative for infection -Repeat CBC at follow up   Chronic bilateral back pain: -Hold baclofen - discontinued at discharge - Sent refill for Flexeril  -Continue prednisone as prescribed recently as outpatient -Continue Neurontin - resume prior dose 600 mg BID as pt reports increased back pain symptoms since dose reduced in outpatient setting -As needed Tylenol   Alcohol use: Patient drinks approximately 12 drinks /week, no signs of withdrawal occurred. -Monitored on CIWA protocol -Pt counseled about importance of cutting down on alcohol use   Depression with anxiety:  Stable, no acute issues. --On trazodone at bedtime for sleep --PCP follow up          Consultants: None Procedures performed: None  Disposition: Home Diet recommendation:  Discharge Diet Orders (From admission, onward)     Start     Ordered   01/15/23 0000  Diet - Carb modified, heart healthy       01/15/23 1147            DISCHARGE MEDICATION: Allergies as of 01/15/2023   No Known Allergies      Medication List     STOP taking these medications    baclofen 10 MG tablet Commonly known as: LIORESAL   fexofenadine 180 MG tablet Commonly known as: Allegra Allergy   fluticasone furoate-vilanterol 100-25 MCG/ACT Aepb Commonly known as: BREO ELLIPTA   Spiriva Respimat 2.5 MCG/ACT Aers Generic drug: Tiotropium Bromide Monohydrate       TAKE these medications    Accu-Chek Softclix Lancets lancets SMARTSIG:Topical   albuterol 108 (90 Base) MCG/ACT inhaler Commonly known as: VENTOLIN HFA Inhale 2 puffs into the lungs every 6 (six) hours as needed for wheezing or shortness of breath.   atorvastatin 20 MG tablet Commonly known as: Lipitor Take 1 tablet (20 mg total) by mouth daily.    cyclobenzaprine 10 MG tablet Commonly known as: FLEXERIL Take 1 tablet (10 mg total) by mouth 3 (three) times daily as needed for muscle spasms.   gabapentin 300 MG capsule Commonly known as: NEURONTIN Take 2 capsules (600 mg total) by mouth 2 (two) times daily. What changed: how much to take   losartan 25 MG tablet Commonly known as: COZAAR Take 1 tablet (25 mg total) by mouth daily.   predniSONE 10 MG tablet Commonly known as: DELTASONE Take 6 tablets by mouth on day 1. Decrease by one tablet each day until finished (6, 5, 4, 3, 2, 1).   traZODone 50 MG tablet Commonly known as: DESYREL TAKE 1/2-1 TABLET BY MOUTH AT BEDTIME ASNEEDED FOR SLEEP        Discharge Exam: Filed Weights   01/14/23 1526 01/14/23 1945  Weight: 95.7 kg 112.9 kg   General exam: awake, alert, no acute distress HEENT: atraumatic, clear conjunctiva, anicteric sclera, moist mucus membranes, hearing grossly normal  Respiratory system: CTAB, no wheezes, rales or rhonchi, normal respiratory effort. Cardiovascular system: normal S1/S2, RRR, no JVD, murmurs, rubs, gallops, no pedal edema.   Gastrointestinal system: soft, NT,  ND, no HSM felt, +bowel sounds. Central nervous system: A&O x 4. no gross focal neurologic deficits, normal speech Extremities: moves all, no edema, normal tone Skin: dry, intact, normal temperature, normal color,No rashes, lesions or ulcers Psychiatry: normal mood, congruent affect, judgement and insight appear normal   Condition at discharge: stable  The results of significant diagnostics from this hospitalization (including imaging, microbiology, ancillary and laboratory) are listed below for reference.   Imaging Studies: CT Angio Head Neck W WO CM  Result Date: 01/14/2023 CLINICAL DATA:  Acute neurologic deficit EXAM: CT ANGIOGRAPHY HEAD AND NECK WITH AND WITHOUT CONTRAST TECHNIQUE: Multidetector CT imaging of the head and neck was performed using the standard protocol during  bolus administration of intravenous contrast. Multiplanar CT image reconstructions and MIPs were obtained to evaluate the vascular anatomy. Carotid stenosis measurements (when applicable) are obtained utilizing NASCET criteria, using the distal internal carotid diameter as the denominator. RADIATION DOSE REDUCTION: This exam was performed according to the departmental dose-optimization program which includes automated exposure control, adjustment of the mA and/or kV according to patient size and/or use of iterative reconstruction technique. CONTRAST:  75mL OMNIPAQUE IOHEXOL 350 MG/ML SOLN COMPARISON:  None Available. FINDINGS: CTA NECK FINDINGS SKELETON: Sclerotic focus within the T3 vertebral body. OTHER NECK: Normal pharynx, larynx and major salivary glands. No cervical lymphadenopathy. Unremarkable thyroid gland. UPPER CHEST: No pneumothorax or pleural effusion. No nodules or masses. AORTIC ARCH: There is no calcific atherosclerosis of the aortic arch. Conventional 3 vessel aortic branching pattern. RIGHT CAROTID SYSTEM: No dissection, occlusion or aneurysm. Mild atherosclerotic calcification at the carotid bifurcation without hemodynamically significant stenosis. LEFT CAROTID SYSTEM: No dissection, occlusion or aneurysm. Mild atherosclerotic calcification at the carotid bifurcation without hemodynamically significant stenosis. VERTEBRAL ARTERIES: Right dominant configuration.There is no dissection, occlusion or flow-limiting stenosis to the skull base (V1-V3 segments). CTA HEAD FINDINGS POSTERIOR CIRCULATION: --Vertebral arteries: Normal V4 segments. --Inferior cerebellar arteries: Normal. --Basilar artery: Normal. --Superior cerebellar arteries: Normal. --Posterior cerebral arteries (PCA): Normal. ANTERIOR CIRCULATION: --Intracranial internal carotid arteries: Normal. --Anterior cerebral arteries (ACA): Normal. Both A1 segments are present. Patent anterior communicating artery (a-comm). --Middle cerebral  arteries (MCA): Normal. VENOUS SINUSES: As permitted by contrast timing, patent. ANATOMIC VARIANTS: None Review of the MIP images confirms the above findings. IMPRESSION: 1. No emergent large vessel occlusion or hemodynamically significant stenosis of the head or neck. 2. Sclerotic focus within the T3 vertebral body, likely a bone island. Electronically Signed   By: Deatra Robinson M.D.   On: 01/14/2023 19:51   MR BRAIN WO CONTRAST  Result Date: 01/14/2023 CLINICAL DATA:  Transient ischemic attack EXAM: MRI HEAD WITHOUT CONTRAST TECHNIQUE: Multiplanar, multiecho pulse sequences of the brain and surrounding structures were obtained without intravenous contrast. COMPARISON:  None Available. FINDINGS: Brain: No acute infarct, mass effect or extra-axial collection. No acute or chronic hemorrhage. There is multifocal hyperintense T2-weighted signal within the white matter. Parenchymal volume and CSF spaces are normal. The midline structures are normal. Vascular: Major flow voids are preserved. Skull and upper cervical spine: Normal calvarium and skull base. Visualized upper cervical spine and soft tissues are normal. Sinuses/Orbits:Mild paranasal sinus mucosal thickening. Normal orbits. IMPRESSION: 1. No acute intracranial abnormality. 2. Chronic microangiopathic white matter changes. Electronically Signed   By: Deatra Robinson M.D.   On: 01/14/2023 19:01   CT HEAD WO CONTRAST ( )  Result Date: 01/14/2023 CLINICAL DATA:  Mental status change, unknown cause EXAM: CT HEAD WITHOUT CONTRAST TECHNIQUE: Contiguous axial images were obtained from the base  of the skull through the vertex without intravenous contrast. RADIATION DOSE REDUCTION: This exam was performed according to the departmental dose-optimization program which includes automated exposure control, adjustment of the mA and/or kV according to patient size and/or use of iterative reconstruction technique. COMPARISON:  None Available. FINDINGS: Brain: No evidence  of acute infarction, hemorrhage, hydrocephalus, extra-axial collection or mass lesion/mass effect. Vascular: No hyperdense vessel or unexpected calcification. Skull: Negative for fracture or suspicious lesion. Incidentally noted sclerotic enostosis within the clivus. Sinuses/Orbits: Diffuse paranasal sinus mucosal thickening. Small amount of layering debris within the left sphenoid sinus. Mastoid air cells are clear. Other: None. IMPRESSION: 1. No acute intracranial findings. 2. Diffuse paranasal sinus disease. Electronically Signed   By: Duanne Guess D.O.   On: 01/14/2023 16:22    Microbiology: Results for orders placed or performed during the hospital encounter of 07/25/20  SARS CORONAVIRUS 2 (TAT 6-24 HRS) Nasopharyngeal Nasopharyngeal Swab     Status: None   Collection Time: 07/25/20  2:34 PM   Specimen: Nasopharyngeal Swab  Result Value Ref Range Status   SARS Coronavirus 2 NEGATIVE NEGATIVE Final    Comment: (NOTE) SARS-CoV-2 target nucleic acids are NOT DETECTED.  The SARS-CoV-2 RNA is generally detectable in upper and lower respiratory specimens during the acute phase of infection. Negative results do not preclude SARS-CoV-2 infection, do not rule out co-infections with other pathogens, and should not be used as the sole basis for treatment or other patient management decisions. Negative results must be combined with clinical observations, patient history, and epidemiological information. The expected result is Negative.  Fact Sheet for Patients: HairSlick.no  Fact Sheet for Healthcare Providers: quierodirigir.com  This test is not yet approved or cleared by the [place] FDA and  has been authorized for detection and/or diagnosis of SARS-CoV-2 by FDA under an Emergency Use Authorization (EUA). This EUA will remain  in effect (meaning this test can be used) for the duration of the COVID-19 declaration under Se ction  564(b)(1) of the Act, 21 U.S.C. section 360bbb-3(b)(1), unless the authorization is terminated or revoked sooner.  Performed at [facility], [address], [place], [place] [address]     Labs: CBC: Recent Labs  Lab 01/14/23 1610  WBC 11.8*  NEUTROABS 9.4*  HGB 15.0  HCT 43.7  MCV 90.3  PLT 257   Basic Metabolic Panel: Recent Labs  Lab 01/14/23 1610  NA 139  K 4.0  CL 103  CO2 28  GLUCOSE 291*  BUN 18  CREATININE 0.93  CALCIUM 9.2   Liver Function Tests: Recent Labs  Lab 01/14/23 1610  AST 34  ALT 30  ALKPHOS 72  BILITOT 0.8  PROT 7.6  ALBUMIN 4.4   CBG: Recent Labs  Lab 01/14/23 2257 01/15/23 0808 01/15/23 1122  GLUCAP 242* 191* 196*    Discharge time spent: greater than 30 minutes.  Signed: Pennie Banter, DO [facility] 01/15/2023

## 2023-01-15 NOTE — Discharge Instructions (Signed)

## 2023-01-15 NOTE — Evaluation (Signed)
Occupational Therapy Evaluation Patient Details Name: Dean Clements MRN: [id number] DOB: 09/08/1965 Today's Date: 01/15/2023   History of Present Illness Pt is a 57 y/o male presenting to hospital with mild slurred speech, dizziness and lightheadedness. Pt was admitted to hospital for Acute metabolic encephalopathy. PMH includes: HTN, T2DM, HLD, Leukocytosis, chronic bilateral back pain, tobacco use, and lumber laminectomy in 2022.   Clinical Impression   Pt was seen for OT evaluation this date. Prior to hospital admission, pt was independent and working, however increasingly limited by back pain. Pt presents to acute OT demonstrating impaired ADL performance and functional mobility 2/2 chronic low back pain, new heel bruising/pain, impacting his balance (See OT problem list for additional functional deficits). Pt currently is modified independent with ADL and mobility, primarily limited by pain. Pt educated in AE/DME for LB ADL tasks and home/routines/work modifications to maximize safety and minimize back pain. Pt verbalized understanding and appreciative of education. No additional acute OT needs at this time. Will sign off.      If plan is discharge home, recommend the following:      Functional Status Assessment  Patient has not had a recent decline in their functional status  Equipment Recommendations  Other (comment) (reacher, LH sponge, LH shoe horn, sock aide)    Recommendations for Other Services       Precautions / Restrictions Precautions Precautions: Fall Restrictions Weight Bearing Restrictions: No      Mobility Bed Mobility Overal bed mobility: Independent                  Transfers Overall transfer level: Independent Equipment used: None                      Balance Overall balance assessment: No apparent balance deficits (not formally assessed)                                         ADL either performed or  assessed with clinical judgement   ADL Overall ADL's : Modified independent                                       General ADL Comments: Pain limited particularly with LB ADL 2/2 bending aggravating back pain     Vision         Perception         Praxis         Pertinent Vitals/Pain Pain Assessment Pain Assessment: Faces Faces Pain Scale: Hurts little more Pain Location: Pt reporting his heel feels bruised and chronic low back pain Pain Descriptors / Indicators: Sore Pain Intervention(s): Monitored during session, Repositioned     Extremity/Trunk Assessment Upper Extremity Assessment Upper Extremity Assessment: Overall WFL for tasks assessed   Lower Extremity Assessment Lower Extremity Assessment: Overall WFL for tasks assessed       Communication Communication Communication: No apparent difficulties   Cognition Arousal: Alert Behavior During Therapy: WFL for tasks assessed/performed Overall Cognitive Status: Within Functional Limits for tasks assessed                                       General Comments       Exercises  Other Exercises Other Exercises: Pt educated in AE/DME for LB ADL tasks and home/routines/work modifications to maximize safety and minimize back pain.   Shoulder Instructions      Home Living Family/patient expects to be discharged to:: Private residence Living Arrangements: Children Available Help at Discharge: Family;Available 24 hours/day Type of Home: Mobile home Home Access: Stairs to enter Entrance Stairs-Number of Steps: 3 Entrance Stairs-Rails: Right;Left;Can reach both Home Layout: One level     Bathroom Shower/Tub: Chief Strategy Officer: Standard Bathroom Accessibility: Yes   Home Equipment: Grab bars - tub/shower;Adaptive equipment Adaptive Equipment: Reacher        Prior Functioning/Environment Prior Level of Function : Independent/Modified  Independent;Driving;Working/employed             Mobility Comments: Working full-time, Tourist information centre manager ADLs Comments: Able to complete independently        OT Problem List: Pain;Decreased knowledge of use of DME or AE      OT Treatment/Interventions:      OT Goals(Current goals can be found in the care plan section) Acute Rehab OT Goals Patient Stated Goal: have less pain OT Goal Formulation: All assessment and education complete, DC therapy  OT Frequency:      Co-evaluation              AM-PAC OT "6 Clicks" Daily Activity     Outcome Measure Help from another person eating meals?: None Help from another person taking care of personal grooming?: None Help from another person toileting, which includes using toliet, bedpan, or urinal?: None Help from another person bathing (including washing, rinsing, drying)?: None Help from another person to put on and taking off regular upper body clothing?: None Help from another person to put on and taking off regular lower body clothing?: None 6 Click Score: 24   End of Session    Activity Tolerance: Patient tolerated treatment well Patient left: in bed;with call bell/phone within reach;with nursing/sitter in room (seated EOB with RN)  OT Visit Diagnosis: Other abnormalities of gait and mobility (R26.89);Pain Pain - Right/Left:  (low back)                Time: 6213-0865 OT Time Calculation (min): 10 min Charges:  OT General Charges $OT Visit: 1 Visit OT Evaluation $OT Eval Low Complexity: 1 Low  Arman Filter., MPH, MS, OTR/L ascom [phone] 01/15/23, 9:32 AM

## 2023-01-16 ENCOUNTER — Telehealth: Payer: Self-pay

## 2023-01-16 ENCOUNTER — Ambulatory Visit (INDEPENDENT_AMBULATORY_CARE_PROVIDER_SITE_OTHER): Payer: Medicaid Other | Admitting: Physician Assistant

## 2023-01-16 ENCOUNTER — Encounter: Payer: Self-pay | Admitting: Physician Assistant

## 2023-01-16 VITALS — BP 128/81 | HR 99 | Ht 71.0 in | Wt 242.6 lb

## 2023-01-16 DIAGNOSIS — Z09 Encounter for follow-up examination after completed treatment for conditions other than malignant neoplasm: Secondary | ICD-10-CM

## 2023-01-16 DIAGNOSIS — E1140 Type 2 diabetes mellitus with diabetic neuropathy, unspecified: Secondary | ICD-10-CM | POA: Diagnosis not present

## 2023-01-16 DIAGNOSIS — G8929 Other chronic pain: Secondary | ICD-10-CM

## 2023-01-16 DIAGNOSIS — Z7984 Long term (current) use of oral hypoglycemic drugs: Secondary | ICD-10-CM

## 2023-01-16 DIAGNOSIS — M5441 Lumbago with sciatica, right side: Secondary | ICD-10-CM | POA: Diagnosis not present

## 2023-01-16 DIAGNOSIS — M5442 Lumbago with sciatica, left side: Secondary | ICD-10-CM | POA: Diagnosis not present

## 2023-01-16 MED ORDER — KETOROLAC TROMETHAMINE 60 MG/2ML IM SOLN
60.0000 mg | Freq: Once | INTRAMUSCULAR | Status: AC
Start: 2023-01-16 — End: 2023-01-16
  Administered 2023-01-16: 60 mg via INTRAMUSCULAR

## 2023-01-16 MED ORDER — METFORMIN HCL 500 MG PO TABS
500.0000 mg | ORAL_TABLET | Freq: Two times a day (BID) | ORAL | 0 refills | Status: AC
Start: 2023-01-16 — End: ?

## 2023-01-16 NOTE — Transitions of Care (Post Inpatient/ED Visit) (Unsigned)
   01/16/2023  Name: Dean Clements MRN: [id number] DOB: 1965-12-24  Today's TOC FU Call Status: Today's TOC FU Call Status:: Unsuccessful Call (1st Attempt) Unsuccessful Call (1st Attempt) Date: 01/16/23  Attempted to reach the patient regarding the most recent Inpatient/ED visit.  Follow Up Plan: Additional outreach attempts will be made to reach the patient to complete the Transitions of Care (Post Inpatient/ED visit) call.   Signature Karena Addison, LPN [facility] Nurse Health Advisor Direct Dial [phone]

## 2023-01-16 NOTE — Progress Notes (Unsigned)
Acute Office Visit   Patient: Dean Clements   DOB: 1965-08-14   57 y.o. Male  MRN: [id number] Visit Date: 01/16/2023  Today's healthcare provider: Oswaldo Conroy , PA-C  Introduced myself to the patient as a Secondary school teacher and provided education on APPs in clinical practice.    Chief Complaint  Patient presents with   Hospitalization Follow-up    Patient says went to the ER and was admitted due to a severe reaction to a muscle relaxer.    Diabetes   Hypertension   Subjective    HPI HPI     Hospitalization Follow-up    Additional comments: Patient says went to the ER and was admitted due to a severe reaction to a muscle relaxer.       Last edited by Malen Gauze, CMA on 01/16/2023  2:51 PM.       He reports he is doing better than he was previously  [place] he is feeling better now but does have some lingering brain fog - "nothing like it was"   Back pain- states he went to UC for sciatica  Was hoping to get Toradol injection at [facility] but was given Decadron and Baclofen  Suspect Baclofen was cause for AMS  Patient was dc with Flexeril script, and told to continue Prednisone - States "those are good"   Transition of Care Hospital Follow up.   Hospital/Facility: [facility] D/C Physician: Esaw Grandchild, DO D/C Date: 01/15/23  Records Requested:  Records Received:  Records Reviewed: hospital DC summary   Diagnoses on Discharge:  Principal Problem:   Acute metabolic encephalopathy Active Problems:   Essential hypertension   Type 2 diabetes mellitus with diabetic neuropathy (HCC)   Hyperlipidemia associated with type 2 diabetes mellitus (HCC)   Leukocytosis   Chronic bilateral back pain   Alcohol use   Depression with anxiety  Date of interactive Contact within 48 hours of discharge: 01/16/23- unsuccessful, contact made at apt today  Contact was through: phone  Date of 7 day or 14 day face-to-face visit:    within 7 days  Outpatient Encounter Medications as of  01/16/2023  Medication Sig   Accu-Chek Softclix Lancets lancets    atorvastatin (LIPITOR) 20 MG tablet Take 1 tablet (20 mg total) by mouth daily.   cyclobenzaprine (FLEXERIL) 10 MG tablet Take 1 tablet (10 mg total) by mouth 3 (three) times daily as needed for muscle spasms.   gabapentin (NEURONTIN) 300 MG capsule Take 2 capsules (600 mg total) by mouth 2 (two) times daily.   losartan (COZAAR) 25 MG tablet Take 1 tablet (25 mg total) by mouth daily.   metFORMIN (GLUCOPHAGE) 500 MG tablet Take 1 tablet (500 mg total) by mouth 2 (two) times daily with a meal.   pantoprazole (PROTONIX) 40 MG tablet Take 40 mg by mouth daily.   predniSONE (DELTASONE) 10 MG tablet Take 6 tablets by mouth on day 1. Decrease by one tablet each day until finished (6, 5, 4, 3, 2, 1).   traZODone (DESYREL) 50 MG tablet TAKE 1/2-1 TABLET BY MOUTH AT BEDTIME ASNEEDED FOR SLEEP   albuterol (VENTOLIN HFA) 108 (90 Base) MCG/ACT inhaler Inhale 2 puffs into the lungs every 6 (six) hours as needed for wheezing or shortness of breath. (Patient not taking: Reported on 01/14/2023)   Facility-Administered Encounter Medications as of 01/16/2023  Medication   ketorolac (TORADOL) injection 60 mg    Diagnostic Tests Reviewed/Disposition: CT head, MRI Brain, CT  angio head and neck   Consults: none   Discharge Instructions: reviewed notes regarding chronic illnesses- will have to get patient back on regular apts with PCP office   Disease/illness Education:  Home Health/Community Services Discussions/Referrals: none   Establishment or re-establishment of referral orders for community resources: none   Discussion with other health care providers:none   Assessment and Support of treatment regimen adherence: reviewed medications with patient and need for compliance with chronic condition management   Appointments Coordinated with: NA  Education for self-management, independent living, and ADLs: not required        Medications: Outpatient Medications Prior to Visit  Medication Sig   Accu-Chek Softclix Lancets lancets    atorvastatin (LIPITOR) 20 MG tablet Take 1 tablet (20 mg total) by mouth daily.   cyclobenzaprine (FLEXERIL) 10 MG tablet Take 1 tablet (10 mg total) by mouth 3 (three) times daily as needed for muscle spasms.   gabapentin (NEURONTIN) 300 MG capsule Take 2 capsules (600 mg total) by mouth 2 (two) times daily.   losartan (COZAAR) 25 MG tablet Take 1 tablet (25 mg total) by mouth daily.   pantoprazole (PROTONIX) 40 MG tablet Take 40 mg by mouth daily.   predniSONE (DELTASONE) 10 MG tablet Take 6 tablets by mouth on day 1. Decrease by one tablet each day until finished (6, 5, 4, 3, 2, 1).   traZODone (DESYREL) 50 MG tablet TAKE 1/2-1 TABLET BY MOUTH AT BEDTIME ASNEEDED FOR SLEEP   albuterol (VENTOLIN HFA) 108 (90 Base) MCG/ACT inhaler Inhale 2 puffs into the lungs every 6 (six) hours as needed for wheezing or shortness of breath. (Patient not taking: Reported on 01/14/2023)   No facility-administered medications prior to visit.    Review of Systems  Respiratory:  Negative for chest tightness and shortness of breath.   Cardiovascular:  Negative for chest pain, palpitations and leg swelling.  Musculoskeletal:  Positive for back pain.  Neurological:  Negative for dizziness, facial asymmetry, weakness, light-headedness, numbness and headaches.  Psychiatric/Behavioral:  Negative for confusion, decreased concentration and hallucinations.     {Insert previous labs (optional):23779} {See past labs  Heme  Chem  Endocrine  Serology  Results Review (optional):1}   Objective    BP 128/81   Pulse 99   Ht 5\' 11"  (1.803 m)   Wt 242 lb 9.6 oz (110 kg)   SpO2 97%   BMI 33.84 kg/m  {Insert last BP/Wt (optional):23777}{See vitals history (optional):1}   Physical Exam Vitals reviewed.  Constitutional:      General: He is awake.     Appearance: Normal appearance. He is  well-developed and well-groomed.  HENT:     Head: Normocephalic and atraumatic.  Pulmonary:     Effort: Pulmonary effort is normal.  Neurological:     General: No focal deficit present.     Mental Status: He is alert and oriented to person, place, and time.     GCS: GCS eye subscore is 4. GCS verbal subscore is 5. GCS motor subscore is 6.     Cranial Nerves: No dysarthria or facial asymmetry.     Gait: Gait is intact.  Psychiatric:        Attention and Perception: Attention and perception normal.        Mood and Affect: Mood and affect normal.        Speech: Speech normal.        Behavior: Behavior normal. Behavior is cooperative.       No results  found for any visits on 01/16/23.  Assessment & Plan      Return in about 3 months (around 04/18/2023) for Diabetes follow up, HTN, HLD, COPD.       Problem List Items Addressed This Visit       Endocrine   Type 2 diabetes mellitus with diabetic neuropathy (HCC)   Relevant Medications   metFORMIN (GLUCOPHAGE) 500 MG tablet     Other   Chronic bilateral back pain   Relevant Medications   ketorolac (TORADOL) injection 60 mg (Start on 01/16/2023  4:00 PM)   Other Visit Diagnoses     Hospital discharge follow-up    -  Primary        Return in about 3 months (around 04/18/2023) for Diabetes follow up, HTN, HLD, COPD.   I,  E , PA-C, have reviewed all documentation for this visit. The documentation on 01/16/23 for the exam, diagnosis, procedures, and orders are all accurate and complete.   Jacquelin Hawking, MHS, PA-C [facility]

## 2023-01-19 NOTE — Assessment & Plan Note (Signed)
Chronic, historic condition Does not appear to be well managed as patient has not been seen in almost a year.  Most recent A1c was 8.0%  He has not been taking metformin or Trulicity  Will send in script for Metformin 500 mg PO BID for now Recommend follow up in 3 months for monitoring and to recheck A1c

## 2023-01-19 NOTE — Transitions of Care (Post Inpatient/ED Visit) (Signed)
   01/19/2023  Name: Dean Clements MRN: [id number] DOB: 1966-04-24  Today's TOC FU Call Status: Today's TOC FU Call Status:: Unsuccessful Call (1st Attempt) Unsuccessful Call (1st Attempt) Date: 01/16/23  Attempted to reach the patient regarding the most recent Inpatient/ED visit. Patient has already been seen Follow Up Plan: No further outreach attempts will be made at this time. We have been unable to contact the patient.  Signature Karena Addison, LPN [facility] Nurse Health Advisor Direct Dial [phone]

## 2023-01-19 NOTE — Assessment & Plan Note (Signed)
Chronic, historic condition He reports some lingering pain and has been using his medications from recent hospitalization as directed- Prednisone taper and flexeril  He is requesting Toradol injection today to assist with further relief Toradol 60 mg injection provided today per request If not improving or if he has relapsing concerns- will send referral to PT services and potentially ortho for further management

## 2023-01-31 ENCOUNTER — Other Ambulatory Visit: Payer: Self-pay | Admitting: Physician Assistant

## 2023-02-02 NOTE — Telephone Encounter (Signed)
Requested medication (s) are due for refill today: routing for review  Requested medication (s) are on the active medication list: yes  Last refill:  01/15/23  Future visit scheduled: yes  Notes to clinic:  Unable to refill per protocol, cannot delegate.      Requested Prescriptions  Pending Prescriptions Disp Refills   cyclobenzaprine (FLEXERIL) 10 MG tablet [Pharmacy Med Name: CYCLOBENZAPRINE HCL 10 MG TAB] 180 tablet     Sig: TAKE 1 TABLET BY MOUTH TWICE DAILY     Not Delegated - Analgesics:  Muscle Relaxants Failed - 01/31/2023 10:38 AM      Failed - This refill cannot be delegated      Passed - Valid encounter within last 6 months    Recent Outpatient Visits           2 weeks ago Hospital discharge follow-up   [place] [facility] Mecum, Erin E, PA-C   10 months ago Chronic right-sided back pain, unspecified back location   [place] [facility] Mecum, Oswaldo Conroy, PA-C   12 months ago Folliculitis   [place] [facility] [place], Clydie Braun, NP   1 year ago Chronic obstructive pulmonary disease, unspecified COPD type (HCC)   [place] [facility] Vigg, Avanti, MD   1 year ago Chronic cough   [place] [facility] Loura Pardon, MD       Future Appointments             In 2 months Larae Grooms, NP [place] [facility], [facility]

## 2023-02-27 ENCOUNTER — Other Ambulatory Visit: Payer: Self-pay | Admitting: Nurse Practitioner

## 2023-02-27 ENCOUNTER — Other Ambulatory Visit: Payer: Self-pay | Admitting: Physician Assistant

## 2023-03-02 NOTE — Telephone Encounter (Signed)
Requested medication (s) are due for refill today:-  Requested medication (s) are on the active medication list: historical med  Last refill:  01/16/23  Future visit scheduled: yes  Notes to clinic:  historical provider   Requested Prescriptions  Pending Prescriptions Disp Refills   pantoprazole (PROTONIX) 40 MG tablet [Pharmacy Med Name: PANTOPRAZOLE SODIUM 40 MG DR TAB] 90 tablet     Sig: TAKE 1 TABLET BY MOUTH ONCE DAILY     Gastroenterology: Proton Pump Inhibitors Passed - 02/27/2023  6:20 PM      Passed - Valid encounter within last 12 months    Recent Outpatient Visits           1 month ago Hospital discharge follow-up   [place] [facility] Mecum, Oswaldo Conroy, PA-C   11 months ago Chronic right-sided back pain, unspecified back location   [place] [facility] Mecum, Oswaldo Conroy, PA-C   1 year ago Folliculitis   [place] [facility] Larae Grooms, NP   1 year ago Chronic obstructive pulmonary disease, unspecified COPD type (HCC)   [place] [facility] Vigg, Avanti, MD   1 year ago Chronic cough   [place] [facility] Vigg, Roma Schanz, MD       Future Appointments             In 1 month Larae Grooms, NP [place] [facility], [facility]

## 2023-03-02 NOTE — Telephone Encounter (Signed)
Requested medications are due for refill today.  yes  Requested medications are on the active medications list.  yes  Last refill. 02/03/2023 #30 0 rf  Future visit scheduled.   yes  Notes to clinic.  Refill not delegated.    Requested Prescriptions  Pending Prescriptions Disp Refills   cyclobenzaprine (FLEXERIL) 10 MG tablet [Pharmacy Med Name: CYCLOBENZAPRINE HCL 10 MG TAB] 30 tablet 0    Sig: TAKE 1 TABLET BY MOUTH TWICE DAILY     Not Delegated - Analgesics:  Muscle Relaxants Failed - 02/27/2023  6:20 PM      Failed - This refill cannot be delegated      Passed - Valid encounter within last 6 months    Recent Outpatient Visits           1 month ago Hospital discharge follow-up   [place] [facility] Mecum, Oswaldo Conroy, PA-C   11 months ago Chronic right-sided back pain, unspecified back location   [place] [facility] Mecum, Oswaldo Conroy, PA-C   1 year ago Folliculitis   [place] [facility] Larae Grooms, NP   1 year ago Chronic obstructive pulmonary disease, unspecified COPD type (HCC)   [place] [facility] Vigg, Avanti, MD   1 year ago Chronic cough   [place] [facility] Loura Pardon, MD       Future Appointments             In 1 month Larae Grooms, NP [place] [facility], [facility]

## 2023-04-20 ENCOUNTER — Ambulatory Visit: Payer: Medicaid Other | Admitting: Nurse Practitioner

## 2023-05-11 ENCOUNTER — Other Ambulatory Visit: Payer: Self-pay | Admitting: Nurse Practitioner

## 2023-05-11 ENCOUNTER — Other Ambulatory Visit: Payer: Self-pay | Admitting: Physician Assistant

## 2023-05-12 NOTE — Telephone Encounter (Signed)
Requested medication (s) are due for refill today - yes  Requested medication (s) are on the active medication list -yes  Future visit scheduled -no  Last refill: 03/03/23 #30  Notes to clinic: non delegated Rx  Requested Prescriptions  Pending Prescriptions Disp Refills   cyclobenzaprine (FLEXERIL) 10 MG tablet [Pharmacy Med Name: CYCLOBENZAPRINE HCL 10 MG TAB] 30 tablet 0    Sig: TAKE 1 TABLET BY MOUTH TWICE DAILY     Not Delegated - Analgesics:  Muscle Relaxants Failed - 05/11/2023 12:58 PM      Failed - This refill cannot be delegated      Passed - Valid encounter within last 6 months    Recent Outpatient Visits           3 months ago Hospital discharge follow-up   [place] [facility] Mecum, Oswaldo Conroy, PA-C   1 year ago Chronic right-sided back pain, unspecified back location   [place] [facility] Mecum, Oswaldo Conroy, PA-C   1 year ago Folliculitis   [place] [facility] [place], Clydie Braun, NP   1 year ago Chronic obstructive pulmonary disease, unspecified COPD type (HCC)   [place] [facility] Vigg, Avanti, MD   1 year ago Chronic cough   [place] [facility] Vigg, Avanti, MD                 Requested Prescriptions  Pending Prescriptions Disp Refills   cyclobenzaprine (FLEXERIL) 10 MG tablet [Pharmacy Med Name: CYCLOBENZAPRINE HCL 10 MG TAB] 30 tablet 0    Sig: TAKE 1 TABLET BY MOUTH TWICE DAILY     Not Delegated - Analgesics:  Muscle Relaxants Failed - 05/11/2023 12:58 PM      Failed - This refill cannot be delegated      Passed - Valid encounter within last 6 months    Recent Outpatient Visits           3 months ago Hospital discharge follow-up   [place] [facility] Mecum, Oswaldo Conroy, PA-C   1 year ago Chronic right-sided back pain, unspecified back location   [place] [facility] Mecum, Oswaldo Conroy, PA-C   1 year ago Folliculitis   [place] [facility] [place], Clydie Braun, NP   1 year ago Chronic obstructive pulmonary disease, unspecified COPD type (HCC)   [place] [facility] Vigg, Avanti, MD   1 year ago Chronic cough    [facility] Loura Pardon, MD

## 2023-05-14 NOTE — Telephone Encounter (Signed)
Requested medication (s) are due for refill today:   Yes  Requested medication (s) are on the active medication list:   Yes  Future visit scheduled:   No   Last ordered: 01/15/2023 #60, 0 refills  Unable to refill due to being last prescribed from a provider during a hospital visit.     Requested Prescriptions  Pending Prescriptions Disp Refills   gabapentin (NEURONTIN) 300 MG capsule [Pharmacy Med Name: GABAPENTIN 300 MG CAP] 180 capsule     Sig: TAKE 1 CAPSULE BY MOUTH TWICE DAILY     Neurology: Anticonvulsants - gabapentin Passed - 05/11/2023 12:57 PM      Passed - Cr in normal range and within 360 days    Creat  Date Value Ref Range Status  03/14/2020 0.83 0.70 - 1.33 mg/dL Final    Comment:    For patients >70 years of age, the reference limit for Creatinine is approximately 13% higher for people identified as African-American. .    Creatinine, Ser  Date Value Ref Range Status  01/14/2023 0.93 0.61 - 1.24 mg/dL Final   Creatinine, Urine  Date Value Ref Range Status  03/04/2018 244 mg/dL Final  16/03/9603 540 mg/dL Final    Comment:    Performed at [facility], [address] [place], [place] [address]         Passed - Completed PHQ-2 or PHQ-9 in the last 360 days      Passed - Valid encounter within last 12 months    Recent Outpatient Visits           3 months ago Hospital discharge follow-up   [place] [facility] Mecum, Oswaldo Conroy, PA-C   1 year ago Chronic right-sided back pain, unspecified back location   [place] [facility] Mecum, Oswaldo Conroy, PA-C   1 year ago Folliculitis   [place] [facility] Larae Grooms, NP   1 year ago Chronic obstructive pulmonary disease, unspecified COPD type (HCC)   [place] [facility] Vigg, Avanti, MD   1 year ago Chronic cough   [place] [facility] Loura Pardon, MD

## 2023-05-14 NOTE — Telephone Encounter (Signed)
Patient is overdue for follow up. Please call to schedule and then route to provider for refill.

## 2023-05-14 NOTE — Telephone Encounter (Signed)
Attempted to reach patient, LVM to call office back to schedule an appointment so that patient can receive refills.  Put in CRM.

## 2023-05-18 NOTE — Telephone Encounter (Signed)
Attempted to reach patient x2, LVM to call office back to get scheduled for further refills.

## 2023-07-06 ENCOUNTER — Other Ambulatory Visit: Payer: Self-pay | Admitting: Nurse Practitioner

## 2023-07-08 NOTE — Telephone Encounter (Signed)
Requested medication (s) are due for refill today - unsure  Requested medication (s) are on the active medication list -yes  Future visit scheduled -no  Last refill: 01/15/23 #60  Notes to clinic: outside provider Rx  Requested Prescriptions  Pending Prescriptions Disp Refills   gabapentin (NEURONTIN) 300 MG capsule [Pharmacy Med Name: GABAPENTIN 300 MG CAP] 180 capsule     Sig: TAKE 1 CAPSULE BY MOUTH TWICE DAILY     Neurology: Anticonvulsants - gabapentin Passed - 07/08/2023  5:29 PM      Passed - Cr in normal range and within 360 days    Creat  Date Value Ref Range Status  03/14/2020 0.83 0.70 - 1.33 mg/dL Final    Comment:    For patients >25 years of age, the reference limit for Creatinine is approximately 13% higher for people identified as African-American. .    Creatinine, Ser  Date Value Ref Range Status  01/14/2023 0.93 0.61 - 1.24 mg/dL Final   Creatinine, Urine  Date Value Ref Range Status  03/04/2018 244 mg/dL Final  81/19/1478 295 mg/dL Final    Comment:    Performed at [facility], [address], [place], [place] [address]         Passed - Completed PHQ-2 or PHQ-9 in the last 360 days      Passed - Valid encounter within last 12 months    Recent Outpatient Visits           5 months ago Hospital discharge follow-up   [place] [facility] Mecum, Oswaldo Conroy, PA-C   1 year ago Chronic right-sided back pain, unspecified back location   [place] [facility] Mecum, Oswaldo Conroy, PA-C   1 year ago Folliculitis   [place] [facility] [place], Clydie Braun, NP   1 year ago Chronic obstructive pulmonary disease, unspecified COPD type (HCC)   [place] [facility] Vigg, Avanti, MD   1 year ago Chronic cough   [place] [facility] Vigg, Avanti, MD                 Requested Prescriptions  Pending Prescriptions Disp Refills   gabapentin (NEURONTIN) 300 MG capsule [Pharmacy Med  Name: GABAPENTIN 300 MG CAP] 180 capsule     Sig: TAKE 1 CAPSULE BY MOUTH TWICE DAILY     Neurology: Anticonvulsants - gabapentin Passed - 07/08/2023  5:29 PM      Passed - Cr in normal range and within 360 days    Creat  Date Value Ref Range Status  03/14/2020 0.83 0.70 - 1.33 mg/dL Final    Comment:    For patients >32 years of age, the reference limit for Creatinine is approximately 13% higher for people identified as African-American. .    Creatinine, Ser  Date Value Ref Range Status  01/14/2023 0.93 0.61 - 1.24 mg/dL Final   Creatinine, Urine  Date Value Ref Range Status  03/04/2018 244 mg/dL Final  86/57/8469 629 mg/dL Final    Comment:    Performed at [facility], [address] [place], [place] [address]         Passed - Completed PHQ-2 or PHQ-9 in the last 360 days      Passed - Valid encounter within last 12 months    Recent Outpatient Visits           5 months ago Hospital discharge follow-up    [facility] Mecum, Oswaldo Conroy,  PA-C   1 year ago Chronic right-sided back pain, unspecified back location   [place] [facility] Mecum, Oswaldo Conroy, PA-C   1 year ago Folliculitis   [place] [facility] Larae Grooms, NP   1 year ago Chronic obstructive pulmonary disease, unspecified COPD type (HCC)   [place] [facility] Vigg, Avanti, MD   1 year ago Chronic cough   [place] [facility] Loura Pardon, MD

## 2023-08-21 ENCOUNTER — Other Ambulatory Visit: Payer: Self-pay | Admitting: Nurse Practitioner

## 2023-08-21 ENCOUNTER — Other Ambulatory Visit: Payer: Self-pay | Admitting: Physician Assistant

## 2023-08-24 NOTE — Telephone Encounter (Signed)
 Requested medication (s) are due for refill today: yes   Requested medication (s) are on the active medication list: yes   Last refill:  03/06/23 #90 0 refills   Future visit scheduled: no   Notes to clinic:  last ordered by E. Mecum , PA. Do you want to refill Rx? No refills remain     Requested Prescriptions  Pending Prescriptions Disp Refills   pantoprazole (PROTONIX) 40 MG tablet [Pharmacy Med Name: PANTOPRAZOLE SODIUM 40 MG DR TAB] 90 tablet 0    Sig: TAKE 1 TABLET BY MOUTH ONCE DAILY     Gastroenterology: Proton Pump Inhibitors Passed - 08/24/2023 11:55 AM      Passed - Valid encounter within last 12 months    Recent Outpatient Visits           7 months ago Hospital discharge follow-up   [place] [facility] Mecum, Oswaldo Conroy, PA-C   1 year ago Chronic right-sided back pain, unspecified back location   [place] [facility] Mecum, Oswaldo Conroy, PA-C   1 year ago Folliculitis   [place] [facility] Larae Grooms, NP   1 year ago Chronic obstructive pulmonary disease, unspecified COPD type (HCC)   [place] [facility] Vigg, Avanti, MD   1 year ago Chronic cough   [place] [facility] Loura Pardon, MD

## 2023-08-24 NOTE — Telephone Encounter (Signed)
 Requested medication (s) are due for refill today - yes  Requested medication (s) are on the active medication list -yes  Future visit scheduled -no  Last refill: 05/12/23 #30  Notes to clinic: non delegated Rx  Requested Prescriptions  Pending Prescriptions Disp Refills   cyclobenzaprine (FLEXERIL) 10 MG tablet [Pharmacy Med Name: CYCLOBENZAPRINE HCL 10 MG TAB] 30 tablet 0    Sig: TAKE 1 TABLET BY MOUTH TWICE DAILY     Not Delegated - Analgesics:  Muscle Relaxants Failed - 08/24/2023 10:18 AM      Failed - This refill cannot be delegated      Failed - Valid encounter within last 6 months    Recent Outpatient Visits           7 months ago Hospital discharge follow-up   [place] [facility] Mecum, Oswaldo Conroy, PA-C   1 year ago Chronic right-sided back pain, unspecified back location   [place] [facility] Mecum, Oswaldo Conroy, PA-C   1 year ago Folliculitis   [place] [facility] [place], Clydie Braun, NP   1 year ago Chronic obstructive pulmonary disease, unspecified COPD type (HCC)   [place] [facility] Vigg, Avanti, MD   1 year ago Chronic cough   [place] [facility] Vigg, Avanti, MD                 Requested Prescriptions  Pending Prescriptions Disp Refills   cyclobenzaprine (FLEXERIL) 10 MG tablet [Pharmacy Med Name: CYCLOBENZAPRINE HCL 10 MG TAB] 30 tablet 0    Sig: TAKE 1 TABLET BY MOUTH TWICE DAILY     Not Delegated - Analgesics:  Muscle Relaxants Failed - 08/24/2023 10:18 AM      Failed - This refill cannot be delegated      Failed - Valid encounter within last 6 months    Recent Outpatient Visits           7 months ago Hospital discharge follow-up   [place] [facility] Mecum, Oswaldo Conroy, PA-C   1 year ago Chronic right-sided back pain, unspecified back location   [place] [facility] Mecum, Oswaldo Conroy, PA-C   1 year ago Folliculitis   [place] [facility] [place], Clydie Braun, NP   1 year ago Chronic obstructive pulmonary disease, unspecified COPD type (HCC)   [place] [facility] Vigg, Avanti, MD   1 year ago Chronic cough   [place] [facility] Loura Pardon, MD

## 2023-12-21 ENCOUNTER — Other Ambulatory Visit: Payer: Self-pay | Admitting: Nurse Practitioner

## 2023-12-22 NOTE — Telephone Encounter (Signed)
 Requested medications are due for refill today.  yes  Requested medications are on the active medications list.  yes  Last refill. Varied  Future visit scheduled.   no  Notes to clinic.  Pt last seen at hospital follow up 01/16/2023.     Requested Prescriptions  Pending Prescriptions Disp Refills   cyclobenzaprine  (FLEXERIL ) 10 MG tablet [Pharmacy Med Name: CYCLOBENZAPRINE  HCL 10 MG TAB] 30 tablet 0    Sig: TAKE 1 TABLET BY MOUTH TWICE DAILY     Not Delegated - Analgesics:  Muscle Relaxants Failed - 12/22/2023  4:38 PM      Failed - This refill cannot be delegated      Failed - Valid encounter within last 6 months    Recent Outpatient Visits   None             gabapentin  (NEURONTIN ) 300 MG capsule [Pharmacy Med Name: GABAPENTIN  300 MG CAP] 180 capsule     Sig: TAKE 1 CAPSULE BY MOUTH TWICE DAILY     Neurology: Anticonvulsants - gabapentin  Failed - 12/22/2023  4:38 PM      Failed - Valid encounter within last 12 months    Recent Outpatient Visits   None            Passed - Cr in normal range and within 360 days    Creat  Date Value Ref Range Status  03/14/2020 0.83 0.70 - 1.33 mg/dL Final    Comment:    For patients >44 years of age, the reference limit for Creatinine is approximately 13% higher for people identified as African-American. .    Creatinine, Ser  Date Value Ref Range Status  01/14/2023 0.93 0.61 - 1.24 mg/dL Final   Creatinine, Urine  Date Value Ref Range Status  03/04/2018 244 mg/dL Final  90/73/7980 772 mg/dL Final    Comment:    Performed at [facility], [address] [place], [place] [address]         Passed - Completed PHQ-2 or PHQ-9 in the last 360 days
